# Patient Record
Sex: Female | Born: 1940 | Race: White | Hispanic: No | Marital: Single | Smoking: Former smoker
Health system: Southern US, Community
[De-identification: ages and names within clinical notes are randomized; demographics above are authoritative.]

## PROBLEM LIST (undated history)

## (undated) DIAGNOSIS — E7800 Pure hypercholesterolemia, unspecified: Secondary | ICD-10-CM

## (undated) DIAGNOSIS — R569 Unspecified convulsions: Secondary | ICD-10-CM

## (undated) DIAGNOSIS — I739 Peripheral vascular disease, unspecified: Secondary | ICD-10-CM

## (undated) DIAGNOSIS — I480 Paroxysmal atrial fibrillation: Secondary | ICD-10-CM

## (undated) DIAGNOSIS — I10 Essential (primary) hypertension: Secondary | ICD-10-CM

## (undated) HISTORY — DX: Unspecified convulsions: R56.9

## (undated) HISTORY — DX: Paroxysmal atrial fibrillation: I48.0

## (undated) HISTORY — DX: Peripheral vascular disease, unspecified: I73.9

---

## 2019-06-18 ENCOUNTER — Encounter (HOSPITAL_COMMUNITY): Payer: Self-pay | Admitting: Emergency Medicine

## 2019-06-18 ENCOUNTER — Inpatient Hospital Stay (HOSPITAL_COMMUNITY): Payer: Medicare Other

## 2019-06-18 ENCOUNTER — Inpatient Hospital Stay (HOSPITAL_COMMUNITY)
Admission: EM | Admit: 2019-06-18 | Discharge: 2019-06-25 | DRG: 101 | Disposition: A | Discharge status: Skilled Nursing Facility | Payer: Medicare Other | Attending: Student in an Organized Health Care Education/Training Program | Admitting: Student in an Organized Health Care Education/Training Program

## 2019-06-18 ENCOUNTER — Emergency Department (HOSPITAL_COMMUNITY): Payer: Medicare Other

## 2019-06-18 ENCOUNTER — Other Ambulatory Visit: Payer: Self-pay

## 2019-06-18 DIAGNOSIS — E871 Hypo-osmolality and hyponatremia: Secondary | ICD-10-CM | POA: Diagnosis present

## 2019-06-18 DIAGNOSIS — Z1389 Encounter for screening for other disorder: Secondary | ICD-10-CM

## 2019-06-18 DIAGNOSIS — E7800 Pure hypercholesterolemia, unspecified: Secondary | ICD-10-CM | POA: Diagnosis present

## 2019-06-18 DIAGNOSIS — I10 Essential (primary) hypertension: Secondary | ICD-10-CM | POA: Diagnosis not present

## 2019-06-18 DIAGNOSIS — F0390 Unspecified dementia without behavioral disturbance: Secondary | ICD-10-CM | POA: Diagnosis not present

## 2019-06-18 DIAGNOSIS — D72829 Elevated white blood cell count, unspecified: Secondary | ICD-10-CM | POA: Diagnosis present

## 2019-06-18 DIAGNOSIS — Z87891 Personal history of nicotine dependence: Secondary | ICD-10-CM | POA: Diagnosis not present

## 2019-06-18 DIAGNOSIS — E785 Hyperlipidemia, unspecified: Secondary | ICD-10-CM | POA: Diagnosis not present

## 2019-06-18 DIAGNOSIS — Z20828 Contact with and (suspected) exposure to other viral communicable diseases: Secondary | ICD-10-CM | POA: Diagnosis present

## 2019-06-18 DIAGNOSIS — Z7901 Long term (current) use of anticoagulants: Secondary | ICD-10-CM | POA: Diagnosis not present

## 2019-06-18 DIAGNOSIS — E861 Hypovolemia: Secondary | ICD-10-CM | POA: Diagnosis not present

## 2019-06-18 DIAGNOSIS — Z8673 Personal history of transient ischemic attack (TIA), and cerebral infarction without residual deficits: Secondary | ICD-10-CM | POA: Diagnosis not present

## 2019-06-18 DIAGNOSIS — I4891 Unspecified atrial fibrillation: Secondary | ICD-10-CM | POA: Diagnosis not present

## 2019-06-18 DIAGNOSIS — Z79899 Other long term (current) drug therapy: Secondary | ICD-10-CM | POA: Diagnosis not present

## 2019-06-18 DIAGNOSIS — R569 Unspecified convulsions: Secondary | ICD-10-CM | POA: Diagnosis not present

## 2019-06-18 DIAGNOSIS — R4182 Altered mental status, unspecified: Secondary | ICD-10-CM | POA: Diagnosis present

## 2019-06-18 DIAGNOSIS — N179 Acute kidney failure, unspecified: Secondary | ICD-10-CM | POA: Diagnosis not present

## 2019-06-18 DIAGNOSIS — D473 Essential (hemorrhagic) thrombocythemia: Secondary | ICD-10-CM | POA: Diagnosis not present

## 2019-06-18 DIAGNOSIS — G9341 Metabolic encephalopathy: Secondary | ICD-10-CM | POA: Diagnosis not present

## 2019-06-18 DIAGNOSIS — G4089 Other seizures: Principal | ICD-10-CM | POA: Diagnosis present

## 2019-06-18 DIAGNOSIS — I361 Nonrheumatic tricuspid (valve) insufficiency: Secondary | ICD-10-CM | POA: Diagnosis not present

## 2019-06-18 DIAGNOSIS — E860 Dehydration: Secondary | ICD-10-CM | POA: Diagnosis present

## 2019-06-18 DIAGNOSIS — I959 Hypotension, unspecified: Secondary | ICD-10-CM | POA: Diagnosis not present

## 2019-06-18 DIAGNOSIS — R079 Chest pain, unspecified: Secondary | ICD-10-CM | POA: Diagnosis not present

## 2019-06-18 DIAGNOSIS — N19 Unspecified kidney failure: Secondary | ICD-10-CM

## 2019-06-18 DIAGNOSIS — I351 Nonrheumatic aortic (valve) insufficiency: Secondary | ICD-10-CM | POA: Diagnosis not present

## 2019-06-18 DIAGNOSIS — E119 Type 2 diabetes mellitus without complications: Secondary | ICD-10-CM | POA: Diagnosis present

## 2019-06-18 HISTORY — DX: Essential (primary) hypertension: I10

## 2019-06-18 HISTORY — DX: Pure hypercholesterolemia, unspecified: E78.00

## 2019-06-18 LAB — GLUCOSE, CAPILLARY: Glucose-Capillary: 99 mg/dL (ref 70–99)

## 2019-06-18 LAB — CBC WITH DIFFERENTIAL/PLATELET
Abs Immature Granulocytes: 0.07 10*3/uL (ref 0.00–0.07)
Basophils Absolute: 0 10*3/uL (ref 0.0–0.1)
Basophils Relative: 0 %
Eosinophils Absolute: 0.3 10*3/uL (ref 0.0–0.5)
Eosinophils Relative: 3 %
HCT: 39.4 % (ref 36.0–46.0)
Hemoglobin: 13.6 g/dL (ref 12.0–15.0)
Immature Granulocytes: 1 %
Lymphocytes Relative: 6 %
Lymphs Abs: 0.6 10*3/uL — ABNORMAL LOW (ref 0.7–4.0)
MCH: 29.8 pg (ref 26.0–34.0)
MCHC: 34.5 g/dL (ref 30.0–36.0)
MCV: 86.4 fL (ref 80.0–100.0)
Monocytes Absolute: 0.8 10*3/uL (ref 0.1–1.0)
Monocytes Relative: 7 %
Neutro Abs: 9.5 10*3/uL — ABNORMAL HIGH (ref 1.7–7.7)
Neutrophils Relative %: 83 %
Platelets: 291 10*3/uL (ref 150–400)
RBC: 4.56 MIL/uL (ref 3.87–5.11)
RDW: 13.7 % (ref 11.5–15.5)
WBC: 11.4 10*3/uL — ABNORMAL HIGH (ref 4.0–10.5)
nRBC: 0 % (ref 0.0–0.2)

## 2019-06-18 LAB — BASIC METABOLIC PANEL
Anion gap: 14 (ref 5–15)
Anion gap: 11 (ref 5–15)
Anion gap: 10 (ref 5–15)
BUN: 27 mg/dL — ABNORMAL HIGH (ref 8–23)
BUN: 26 mg/dL — ABNORMAL HIGH (ref 8–23)
BUN: 26 mg/dL — ABNORMAL HIGH (ref 8–23)
CO2: 19 mmol/L — ABNORMAL LOW (ref 22–32)
CO2: 21 mmol/L — ABNORMAL LOW (ref 22–32)
CO2: 22 mmol/L (ref 22–32)
Calcium: 8.9 mg/dL (ref 8.9–10.3)
Calcium: 8.9 mg/dL (ref 8.9–10.3)
Calcium: 8.8 mg/dL — ABNORMAL LOW (ref 8.9–10.3)
Chloride: 93 mmol/L — ABNORMAL LOW (ref 98–111)
Chloride: 92 mmol/L — ABNORMAL LOW (ref 98–111)
Chloride: 94 mmol/L — ABNORMAL LOW (ref 98–111)
Creatinine, Ser: 1.68 mg/dL — ABNORMAL HIGH (ref 0.44–1.00)
Creatinine, Ser: 1.5 mg/dL — ABNORMAL HIGH (ref 0.44–1.00)
Creatinine, Ser: 1.35 mg/dL — ABNORMAL HIGH (ref 0.44–1.00)
GFR calc Af Amer: 34 mL/min — ABNORMAL LOW (ref >60)
GFR calc Af Amer: 39 mL/min — ABNORMAL LOW (ref >60)
GFR calc Af Amer: 44 mL/min — ABNORMAL LOW (ref >60)
GFR calc non Af Amer: 29 mL/min — ABNORMAL LOW (ref >60)
GFR calc non Af Amer: 33 mL/min — ABNORMAL LOW (ref >60)
GFR calc non Af Amer: 38 mL/min — ABNORMAL LOW (ref >60)
Glucose, Bld: 105 mg/dL — ABNORMAL HIGH (ref 70–99)
Glucose, Bld: 103 mg/dL — ABNORMAL HIGH (ref 70–99)
Glucose, Bld: 101 mg/dL — ABNORMAL HIGH (ref 70–99)
Potassium: 3.6 mmol/L (ref 3.5–5.1)
Potassium: 3.7 mmol/L (ref 3.5–5.1)
Potassium: 3.6 mmol/L (ref 3.5–5.1)
Sodium: 126 mmol/L — ABNORMAL LOW (ref 135–145)
Sodium: 124 mmol/L — ABNORMAL LOW (ref 135–145)
Sodium: 126 mmol/L — ABNORMAL LOW (ref 135–145)

## 2019-06-18 LAB — RAPID URINE DRUG SCREEN, HOSP PERFORMED
Amphetamines: NOT DETECTED
Barbiturates: NOT DETECTED
Benzodiazepines: POSITIVE — AB
Cocaine: NOT DETECTED
Opiates: NOT DETECTED
Tetrahydrocannabinol: NOT DETECTED

## 2019-06-18 LAB — URINALYSIS, ROUTINE W REFLEX MICROSCOPIC
Bilirubin Urine: NEGATIVE
Glucose, UA: NEGATIVE mg/dL
Hgb urine dipstick: NEGATIVE
Ketones, ur: NEGATIVE mg/dL
Nitrite: NEGATIVE
Protein, ur: NEGATIVE mg/dL
Specific Gravity, Urine: 1.014 (ref 1.005–1.030)
pH: 5 (ref 5.0–8.0)

## 2019-06-18 LAB — COMPREHENSIVE METABOLIC PANEL
ALT: 22 U/L (ref 0–44)
AST: 36 U/L (ref 15–41)
Albumin: 3.9 g/dL (ref 3.5–5.0)
Alkaline Phosphatase: 65 U/L (ref 38–126)
Anion gap: 15 (ref 5–15)
BUN: 36 mg/dL — ABNORMAL HIGH (ref 8–23)
CO2: 19 mmol/L — ABNORMAL LOW (ref 22–32)
Calcium: 9.3 mg/dL (ref 8.9–10.3)
Chloride: 91 mmol/L — ABNORMAL LOW (ref 98–111)
Creatinine, Ser: 2.5 mg/dL — ABNORMAL HIGH (ref 0.44–1.00)
GFR calc Af Amer: 21 mL/min — ABNORMAL LOW (ref >60)
GFR calc non Af Amer: 18 mL/min — ABNORMAL LOW (ref >60)
Glucose, Bld: 119 mg/dL — ABNORMAL HIGH (ref 70–99)
Potassium: 3.6 mmol/L (ref 3.5–5.1)
Sodium: 125 mmol/L — ABNORMAL LOW (ref 135–145)
Total Bilirubin: 0.2 mg/dL — ABNORMAL LOW (ref 0.3–1.2)
Total Protein: 6.5 g/dL (ref 6.5–8.1)

## 2019-06-18 LAB — SARS CORONAVIRUS 2 BY RT PCR (HOSPITAL ORDER, PERFORMED IN ~~LOC~~ HOSPITAL LAB): SARS Coronavirus 2: NEGATIVE

## 2019-06-18 LAB — CBG MONITORING, ED
Glucose-Capillary: 121 mg/dL — ABNORMAL HIGH (ref 70–99)
Glucose-Capillary: 107 mg/dL — ABNORMAL HIGH (ref 70–99)

## 2019-06-18 LAB — HEMOGLOBIN A1C
Hgb A1c MFr Bld: 6.3 % — ABNORMAL HIGH (ref 4.8–5.6)
Mean Plasma Glucose: 134.11 mg/dL

## 2019-06-18 LAB — OSMOLALITY: Osmolality: 266 mOsm/kg — ABNORMAL LOW (ref 275–295)

## 2019-06-18 LAB — CK: Total CK: 307 U/L — ABNORMAL HIGH (ref 38–234)

## 2019-06-18 LAB — OSMOLALITY, URINE: Osmolality, Ur: 480 mOsm/kg (ref 300–900)

## 2019-06-18 LAB — MAGNESIUM: Magnesium: 1.7 mg/dL (ref 1.7–2.4)

## 2019-06-18 MED ORDER — ATENOLOL 50 MG PO TABS: 100.0000 mg | ORAL_TABLET | Freq: Two times a day (BID) | ORAL | Status: DC

## 2019-06-18 MED ORDER — SODIUM CHLORIDE 0.9% FLUSH
3.0000 mL | Freq: Two times a day (BID) | INTRAVENOUS | Status: DC
  Administered 2019-06-18 – 2019-06-25 (×11): 3 mL via INTRAVENOUS

## 2019-06-18 MED ORDER — INSULIN ASPART 100 UNIT/ML ~~LOC~~ SOLN
0.0000 [IU] | Freq: Three times a day (TID) | SUBCUTANEOUS | Status: DC
  Administered 2019-06-21: 1 [IU] via SUBCUTANEOUS

## 2019-06-18 MED ORDER — HEPARIN SODIUM (PORCINE) 5000 UNIT/ML IJ SOLN
5000.0000 [IU] | Freq: Three times a day (TID) | INTRAMUSCULAR | Status: DC
  Administered 2019-06-18 – 2019-06-20 (×5): 5000 [IU] via SUBCUTANEOUS
  Filled 2019-06-18 (×6): qty 1

## 2019-06-18 MED ORDER — SODIUM CHLORIDE 0.9 % IV SOLN
INTRAVENOUS | Status: DC
  Administered 2019-06-18 – 2019-06-19 (×4): via INTRAVENOUS

## 2019-06-18 MED ORDER — PRAVASTATIN SODIUM 40 MG PO TABS
40.0000 mg | ORAL_TABLET | Freq: Every day | ORAL | Status: DC
  Administered 2019-06-19 – 2019-06-24 (×6): 40 mg via ORAL
  Filled 2019-06-18 (×5): qty 1

## 2019-06-18 MED ORDER — ATENOLOL 50 MG PO TABS
100.0000 mg | ORAL_TABLET | Freq: Two times a day (BID) | ORAL | Status: DC
  Administered 2019-06-18: 23:00:00 100 mg via ORAL
  Filled 2019-06-18: qty 2

## 2019-06-18 MED ORDER — ACETAMINOPHEN 325 MG PO TABS
650.0000 mg | ORAL_TABLET | Freq: Four times a day (QID) | ORAL | Status: DC | PRN
  Administered 2019-06-22: 21:00:00 650 mg via ORAL
  Filled 2019-06-18: qty 2

## 2019-06-18 MED ORDER — SODIUM CHLORIDE 0.9 % IV BOLUS
1000.0000 mL | Freq: Once | INTRAVENOUS | Status: AC
  Administered 2019-06-18: 1000 mL via INTRAVENOUS

## 2019-06-18 MED ORDER — DIAZEPAM 5 MG PO TABS
5.0000 mg | ORAL_TABLET | Freq: Two times a day (BID) | ORAL | Status: DC | PRN
  Administered 2019-06-20 – 2019-06-24 (×6): 5 mg via ORAL
  Filled 2019-06-18 (×7): qty 1

## 2019-06-18 MED ORDER — LEVETIRACETAM IN NACL 500 MG/100ML IV SOLN
500.0000 mg | Freq: Two times a day (BID) | INTRAVENOUS | Status: DC
  Administered 2019-06-18 – 2019-06-20 (×4): 500 mg via INTRAVENOUS
  Filled 2019-06-18 (×5): qty 100

## 2019-06-18 MED ORDER — ACETAMINOPHEN 650 MG RE SUPP: 650.0000 mg | Freq: Four times a day (QID) | RECTAL | Status: DC | PRN

## 2019-06-18 MED ORDER — ONDANSETRON HCL 4 MG PO TABS: 4.0000 mg | ORAL_TABLET | Freq: Four times a day (QID) | ORAL | Status: DC | PRN

## 2019-06-18 MED ORDER — ONDANSETRON HCL 4 MG/2ML IJ SOLN: 4.0000 mg | Freq: Four times a day (QID) | INTRAMUSCULAR | Status: DC | PRN

## 2019-06-18 NOTE — ED Provider Notes (Signed)
[facility] EMERGENCY DEPARTMENT Provider Note   CSN: [id number] Arrival date & time: 06/18/19  [id number]     History   Chief Complaint Chief Complaint  Patient presents with  . Altered Mental Status    HPI Jakira Locklin is a 78 y.o. female.     78 year old female with a history of hypertension, dyslipidemia presents to the emergency department for altered mental status.  Per EMS, family reports that patient has had increased slurred speech and confusion x1 week.  Friend of the family is a speech therapist and expressed concern that the patient may have had a stroke.  Family has been monitoring the patient since this change.  Also per EMS, patient has gone from independent to dependent with in a months timeframe.  She is from the [place], [place], but moved in with family in [place] and that family moved her to her daughter's home.  She was most recently sent to live with her son in [place] who is unsure of the patient's baseline as he has not seen her in 2 years.  Patient denies any acute pain at this time.  Does concur that she has been more forgetful recently.  The history is provided by the patient and the EMS personnel. No language interpreter was used.  Altered Mental Status   Past Medical History:  Diagnosis Date  . Hypercholesteremia   . Hypertension     There are no active problems to display for this patient.   History reviewed. No pertinent surgical history.   OB History   No obstetric history on file.      Home Medications    Prior to Admission medications   Not on File    Family History History reviewed. No pertinent family history.  Social History Social History   Tobacco Use  . Smoking status: Former Research scientist (life sciences)  . Smokeless tobacco: Never Used  Substance Use Topics  . Alcohol use: Not Currently  . Drug use: Not Currently     Allergies   Patient has no known allergies.   Review of Systems Review of Systems Ten  systems reviewed and are negative for acute change, except as noted in the HPI.    Physical Exam Updated Vital Signs BP 101/75   Pulse 60   Temp 98.9 F (37.2 C) (Oral)   Resp 19   Ht 5\' 6"  (1.676 m)   Wt 63.5 kg   SpO2 96%   BMI 22.60 kg/m   Physical Exam Vitals signs and nursing note reviewed.  Constitutional:      General: She is not in acute distress.    Appearance: She is well-developed. She is not diaphoretic.     Comments: Patient in no acute distress.  Pleasant.  HENT:     Head: Normocephalic.     Comments: Contusion to L frontal/parietal scalp. No hematoma.    Right Ear: Tympanic membrane, ear canal and external ear normal.     Left Ear: Tympanic membrane, ear canal and external ear normal.     Ears:     Comments: No hemotympanum b/l    Mouth/Throat:     Mouth: Mucous membranes are moist.     Comments: Normal tongue protrusion.  Symmetric rise of uvula with phonation. Eyes:     General: No scleral icterus.    Extraocular Movements: Extraocular movements intact.     Conjunctiva/sclera: Conjunctivae normal.     Pupils: Pupils are equal, round, and reactive to light.  Comments: No nystagmus  Neck:     Musculoskeletal: Normal range of motion.  Cardiovascular:     Rate and Rhythm: Normal rate and regular rhythm.     Pulses: Normal pulses.  Pulmonary:     Effort: Pulmonary effort is normal. No respiratory distress.     Comments: Respirations even and unlabored Musculoskeletal: Normal range of motion.  Skin:    General: Skin is warm and dry.     Coloration: Skin is not pale.     Findings: No erythema or rash.  Neurological:     Mental Status: She is alert.     Comments: GCS 15. Alert to self, but unable to state year, city, president. Inability to recall 3 words given during exam. Speech mildly slurred, but may be limited due to absent denition. No cranial nerve deficits appreciated; symmetric eyebrow raise, no facial drooping, tongue midline. Patient has  equal grip strength bilaterally with 5/5 strength against resistance in all major muscle groups bilaterally. Sensation to light touch intact. Patient moves extremities without ataxia.  Psychiatric:        Behavior: Behavior normal.      ED Treatments / Results  Labs (all labs ordered are listed, but only abnormal results are displayed) Labs Reviewed  CBG MONITORING, ED - Abnormal; Notable for the following components:      Result Value   Glucose-Capillary 121 (*)    All other components within normal limits  URINE CULTURE  COMPREHENSIVE METABOLIC PANEL  CBC WITH DIFFERENTIAL/PLATELET  URINALYSIS, ROUTINE W REFLEX MICROSCOPIC  RAPID URINE DRUG SCREEN, HOSP PERFORMED    EKG None  Radiology No results found.  Procedures Procedures (including critical care time)  Medications Ordered in ED Medications - No data to display    Initial Impression / Assessment and Plan / ED Course  I have reviewed the triage vital signs and the nursing notes.  Pertinent labs & imaging results that were available during my care of the patient were reviewed by me and considered in my medical decision making (see chart for details).        67:19 AM 78 year old female presents to the emergency department for evaluation of altered mental status.  History suggests potential concern for underlying stroke due to slurred speech.  Family also reported increased confusion x1 week to EMS.  Patient has no definitive focal findings on her neurologic exam, but has difficulty recalling recent memory.  Is alert to self, but unable to specify city, year, president.  Will obtain labs as well as head CT.  May require MRI for stroke rule out.  6:27 AM CBG 121, per RN  6:34 AM Patient more groggy in the AM yesterday, but perked up by the afternoon. Son states that patient was "russling" around 3AM. Assisted to restroom around [place] to check on patient later and found patient sitting in her walker; unable to  get her to wake up. Arms seemed stiff and her whole body was shaking. Head was tilted back with erratic breathing. Unresponsive for ~15-20 minutes. Started to become responsive when on the phone with EMS.  Per son, patient recently placed on abx for UTI. He is unsure if she has been taking this consistently. Son says patient had an appointment at [facility] for placement in an assisted living facility in [place], [place]. Currently the patient has no POA.  When asked about the son's comfort excepting the patient back into his home should she be stable for discharge, he states that he has  been overwhelmed in caring for her and is unsure if that is the best option for the patient as well as for himself.  For this reason, would benefit from care management and social work consultation as well as assessment by PT/OT.  Patient signed out to Albrizze, PA-C at shift change.   Final Clinical Impressions(s) / ED Diagnoses   Final diagnoses:  Altered mental status, unspecified altered mental status type    ED Discharge Orders    None       Antonietta Breach, PA-C 06/18/19 [place], [place], MD 06/18/19 [phone]

## 2019-06-18 NOTE — Procedures (Signed)
Patient Name: Kendra Charles  MRN: [id number]  Epilepsy Attending: Lora Havens  Referring Physician/Provider: Dr Welford Roche Date: 06/18/2019 Duration: 26.14 mins  Patient history: 78yo F with right parietal infarct who presented with seizure like episode. EEG to evaluate for seizure.  Level of alertness: awake  AEDs during EEG study: LEV  Technical aspects: This EEG study was done with scalp electrodes positioned according to the 10-20 International system of electrode placement. Electrical activity was acquired at a sampling rate of 500Hz  and reviewed with a high frequency filter of 70Hz  and a low frequency filter of 1Hz . EEG data were recorded continuously and digitally stored.   DESCRIPTION: EEG showed continuous low voltage slowing in right frontotemporal region. There was also continuous generalized 2-6hz  polymorphic theta-delta slowing.  There was also an excessive amount of 15 to 18 Hz, 2-3 uV beta activity with irregular morphology distributed symmetrically and diffusely. No clear posterior dominant rhythm was seen. Hyperventilation and photic stimulation were not performed.  IMPRESSION: This study is suggestive of cortical dysfunction in right frontotemporal region, likely secondary to underlying chronic infarct. There was also moderate diffuse encephalopathy. No seizures or definite epileptiform discharges were seen throughout the recording.  The excessive beta activity seen in the background could be due to the effect of benzodiazepine and is a benign EEG pattern.  Kendra Charles Kendra Charles

## 2019-06-18 NOTE — Progress Notes (Signed)
Patient received to the unit via bed. Patient is alert and oriented self. Iv in place and running fluid, patient is on telemetry. Skin assessment done with another nurse. No any distress noted. Patient given instructions about call bell and phone. Bed in low position and call bell in reach.

## 2019-06-18 NOTE — H&P (Signed)
Date: 06/18/2019               Patient Name:  Kendra Charles MRN: [id number]  DOB: Jul 22, 1941 Age / Sex: 78 y.o., female   PCP: Patient, No Pcp Per         Medical Service: Internal Medicine Teaching Service         Attending Physician: Dr. Evette Doffing, Mallie Mussel, *    First Contact: Dr. Sheppard Coil Pager: [phone]  Second Contact: Dr. Berline Lopes  Pager: [phone]       After Hours (After 5p/  First Contact Pager: [phone]  weekends / holidays): Second Contact Pager: [phone]   Chief Complaint: Altered mental status  History of Present Illness:  Kendra Charles is a 78 year-old female with a history of CVA, dementia, HTN, HLD, and ?PAD who presented from home after being found unresponsive by son.  She does not remember the events of this morning.  I called son Kendra Charles for collateral.  Patient moved in with him 2 days ago after his sister was no longer able to take care of her.  Prior to Tuesday the last time he saw patient was 2 years ago.  At baseline she is talkative but needs assistance completing all of her ADLs.  She has been doing overall well until this morning when he assisted her to the bathroom at 4:30 AM.  After she did not call him to be helped out he went to check on her.  He found her seating in the toilet with her head extended back and bilaterals arm shaking.  He is not sure how long this lasted.  He was unable to arouse her after this event and called EMS. She does not have a history of seizures to his knowledge.  Her only new medication is Bactrim for UTI that she received recently in a hospital. She does not drink alcohol or use illicit drugs. She has not seen a doctor in at least 4 years but is taking medications. Son is not sure who is prescribing these medications. She complains of diffuse soreness. She denies HA, changes in vision, dysphagia, chest pain, SOB, cough, abdominal pain, N/V, urinary symptoms, and changes in bowel movements.   ED course: Patient was  afebrile and hemodynamically stable on arrival. She was alert and talkative. Blood work showed a mild leukocytosis 11.2, NA 125, K 3.6, BUN 36, Cr 2.5, serum osm 266, and COVID negative. CXR without acute abnormalities. CT head showed old R parietal and L basal ganglia infarcts. She received 1L NS bolus.   Meds:  Current Meds  Medication Sig   atenolol (TENORMIN) 100 MG tablet Take 100 mg by mouth 2 (two) times daily.    diazepam (VALIUM) 5 MG tablet Take 5 mg by mouth as needed.   hydrochlorothiazide (HYDRODIURIL) 25 MG tablet Take 25 mg by mouth daily.   lisinopril (ZESTRIL) 30 MG tablet Take 30 mg by mouth daily.   lovastatin (MEVACOR) 40 MG tablet Take 40 mg by mouth daily.   sulfamethoxazole-trimethoprim (BACTRIM DS) 800-160 MG tablet Take 1 tablet by mouth 2 (two) times daily.   traMADol (ULTRAM) 50 MG tablet Take 50 mg by mouth as needed.   [DISCONTINUED] ALPRAZolam (XANAX) 0.25 MG tablet Take 0.25 mg by mouth daily.    Allergies: Allergies as of 06/18/2019   (No Known Allergies)   Past Medical History:  Diagnosis Date   Hypercholesteremia    Hypertension     Family History: HTN -father and  mother  Social History: Patient is originally from [place].  Two months ago she moved from [place] where she was living with her sister to [place] [address] over her daughter.  Her daughter is no longer able to provide care for her and send her to [place] to live with her son [name].  He has not seen her in 2 years and is not clear how her mentation and function are at baseline.  He does know she has been deteriorating for the past 2 years.  She is talkative (has a slurred speech at baseline), but is no longer able to complete ADLs other than dressing herself.  [name] and his wife were in the process of moving her to an assisted living facility in [place], [place]. She does not drink alcohol or use illicit drugs to his knowledge.  Stopped smoking 50 years ago.  Review of Systems: A  complete ROS was negative except as per HPI.   Physical Exam: Blood pressure 121/79, pulse (!) 56, temperature 98.9 F (37.2 C), temperature source Oral, resp. rate 19, height 5\' 6"  (1.676 m), weight 63.5 kg, SpO2 98 %.  General: chronically ill appearing female, sleeping, slumping to the L, in no acute distress  HENT: NCAT, neck supple and FROM, mucus membranes dry,poor dentition  Eyes: anicteric sclera, PERRL Cardiac: regular rate and rhythm, nl S1/S2, no murmurs, rubs or gallops  Pulm: CTAB, no wheezes or crackles, no increased work of breathing on room air  Abd: soft, NTND, bowel sounds are normoactive  Neuro: A&Ox3, CN II-XII intact, sensation intact in all four extremities, 5/5 strength in all 4 extremities, no dysmetria, no dysdiadochokinesia. Speech is slurred. Gait not tested.   Ext: warm and well perfused, no peripheral edema Derm: no rashes or lesions  Psych: attentive, appropriate affect, answers questions appropriately     CT head: IMPRESSION:  1. 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  2. Advanced atrophy and white matter disease.  3. Remote lacunar infarcts of the left basal ganglia.  4. Age indeterminate right basal ganglia infarct.  5. No acute hemorrhage.  6. Atherosclerosis.    Assessment & Plan by Problem: Principal Problem:   Hyponatremia Active Problems:   AKI (acute kidney injury) ([place])   Dementia ([place])   Essential hypertension   Hyperlipidemia  Apoorva Maners is a 78 year old female with a history of dementia, HTN,, and HLD who presents from home with acute change in mentation this morning concerning for CVA or seizure.  # Acute metabolic encephalopathy: Patient found unresponsive this AM with jerking movements of UE concerning for seizure activity which she is at risk given his history of CVAs. She is also hyponatremic with Na 125, though seizures usually seen with levels < 115. CT head showed old R parietal and L basal ganglia  infarcts and advanced atrophy. Neurology was consulted in the ED who recommended MRI brain and EEG.  - Appreciate neurology recommendations - Follow up MRI brain and EEG - Delirium and seizure precautions  - Correcting metabolic abnormalities as below   # Hypovolemic, hypotonic hyponatremia: I suspect this is secondary to poor oral intake she appears hypovolemic on exam.  She received 1L bolus of NS in the ED.  - NS @ 75 cc/hr with goal Na 131 by 6AM tomorrow  - Follow up BMP every 4 hours   # AKI:  Her creatinine on admission was 2.5 (no prior ones available for comparison). It is unknown if patient has underlying CKD.  She does take HCTZ and lisinopril for HTN and was started on Bactrim recently for a possible UTI. - Stop Bactrim  - Hold home antihypertensive medications - Renal US  - Will check CK due to possible seizure   # Deconditioning: Per son, patient's health has been deteriorating for the past 2 months. She has not been seen by a physician in years and is no longer able to complete ADLs. He and his wife were recently able to get her health insurance and found a  ALF where she could stay permanently, [facility]. She was supposed to move today. Will consult SW to assist with transition once patient is ready for discharge.   # HTN: Currently normotensive.  Holding medications as above. # HLD: Continue lovastatin 40 mg daily.   Diet: [place] IVF: NS @ 75cc/hr  VTE ppx: SQ heparin q8h  Code status: FULL CODE, confirmed on admission   Son: Kendra Charles [phone] and [phone]  Daughter: Lennie Muckle [phone]   Dispo: Admit patient to Inpatient with expected length of stay greater than 2 midnights.  SignedWelford Roche, MD 06/18/2019, 11:08 AM  Pager: [phone]

## 2019-06-18 NOTE — ED Provider Notes (Signed)
Patient received in sign out.    In short, 78 yo female presents after possible seizure vs AMS. Prior to arrival, pt was found to be sitting on her walker, with both arms contracted and decreased level of responsiveness. Son reports not being able to wake her up. EMS arrived 20 minutes later and pt appeared to be back to baseline. Pt did not bite her tongue, no urinary or fecal incontinence. Pt is currently living with her son who has not seen her in 2 years and is unsure of complete medical history. He reports she had a UTI recently and was given antibiotics.  Work up by previous provider is significant for hyponatremia of 125, BUN/creatinine of 36/2.50. CT head is concerning for old infarcts.   Physical Exam  BP 110/75   Pulse (!) 59   Temp 98.9 F (37.2 C) (Oral)   Resp (!) 22   Ht 5\' 6"  (1.676 m)   Wt 63.5 kg   SpO2 97%   BMI 22.60 kg/m   Physical Exam PE: Constitutional: well-developed, well-nourished, no apparent distress HENT: normocephalic.  Contusion to left frontal/parietal scalp, no hematoma seen.  No battle signs or raccoon eyes.  No hemotympanum. No lacerations to tongue.  Cardiovascular: normal rate and rhythm, distal pulses intact Pulmonary/Chest: effort normal; breath sounds clear and equal bilaterally; no wheezes or rales Abdominal: soft and nontender Musculoskeletal: full ROM, no edema Neurological: Patient alert to self only.  GCS is 15.  No facial droop, cranial nerves II through XII are intact.  Equal grip strength with 5 out of 5 strength in bilateral upper and lower extremities.  Sensation intact.  Patient has slurred speech, also has multiple missing teeth likely contributing. Son reports speech is unchanged. Skin: warm and dry, no rash, no diaphoresis Psychiatric: normal mood and affect, normal behavior   ED Course/Procedures   Results for orders placed or performed during the hospital encounter of 06/18/19 (from the past 24 hour(s))  Comprehensive metabolic  panel     Status: Abnormal   Collection Time: 06/18/19  6:15 AM  Result Value Ref Range   Sodium 125 (L) 135 - 145 mmol/L   Potassium 3.6 3.5 - 5.1 mmol/L   Chloride 91 (L) 98 - 111 mmol/L   CO2 19 (L) 22 - 32 mmol/L   Glucose, Bld 119 (H) 70 - 99 mg/dL   BUN 36 (H) 8 - 23 mg/dL   Creatinine, Ser 2.50 (H) 0.44 - 1.00 mg/dL   Calcium 9.3 8.9 - 10.3 mg/dL   Total Protein 6.5 6.5 - 8.1 g/dL   Albumin 3.9 3.5 - 5.0 g/dL   AST 36 15 - 41 U/L   ALT 22 0 - 44 U/L   Alkaline Phosphatase 65 38 - 126 U/L   Total Bilirubin 0.2 (L) 0.3 - 1.2 mg/dL   GFR calc non Af Amer 18 (L) >60 mL/min   GFR calc Af Amer 21 (L) >60 mL/min   Anion gap 15 5 - 15  CBC WITH DIFFERENTIAL     Status: Abnormal   Collection Time: 06/18/19  6:15 AM  Result Value Ref Range   WBC 11.4 (H) 4.0 - 10.5 K/uL   RBC 4.56 3.87 - 5.11 MIL/uL   Hemoglobin 13.6 12.0 - 15.0 g/dL   HCT 39.4 36.0 - 46.0 %   MCV 86.4 80.0 - 100.0 fL   MCH 29.8 26.0 - 34.0 pg   MCHC 34.5 30.0 - 36.0 g/dL   RDW 13.7 11.5 - 15.5 %  Platelets 291 150 - 400 K/uL   nRBC 0.0 0.0 - 0.2 %   Neutrophils Relative % 83 %   Neutro Abs 9.5 (H) 1.7 - 7.7 K/uL   Lymphocytes Relative 6 %   Lymphs Abs 0.6 (L) 0.7 - 4.0 K/uL   Monocytes Relative 7 %   Monocytes Absolute 0.8 0.1 - 1.0 K/uL   Eosinophils Relative 3 %   Eosinophils Absolute 0.3 0.0 - 0.5 K/uL   Basophils Relative 0 %   Basophils Absolute 0.0 0.0 - 0.1 K/uL   Immature Granulocytes 1 %   Abs Immature Granulocytes 0.07 0.00 - 0.07 K/uL  CBG monitoring, ED     Status: Abnormal   Collection Time: 06/18/19  6:23 AM  Result Value Ref Range   Glucose-Capillary 121 (H) 70 - 99 mg/dL   CT HEAD WITHOUT CONTRAST    TECHNIQUE:  Contiguous axial images were obtained from the base of the skull  through the vertex without intravenous contrast.    COMPARISON: None.    FINDINGS:  Brain: Advanced atrophy and diffuse white matter disease is present.  There is a remote infarct with chronic  volume loss in the right  parietal lobe. Lacunar infarcts involving the thalami and posterior  limb internal capsule bilaterally appear remote. Remote left basal  ganglia infarct is present. A more age indeterminate lacunar infarct  is present in the right caudate head and internal capsule.    Confluent white matter changes are present bilaterally. The  ventricles are proportionate to the degree of atrophy. No  significant extraaxial fluid collection is present. The brainstem  and cerebellum are unremarkable.    Vascular: Atherosclerotic calcifications are present within the  cavernous internal carotid arteries bilaterally and at the dural  margin of the left vertebral artery. There is no hyperdense vessel.    Skull: Normal. Negative for fracture or focal lesion.    Sinuses/Orbits: The paranasal sinuses and mastoid air cells are  clear. The globes and orbits are within normal limits.    IMPRESSION:  1. 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  2. Advanced atrophy and white matter disease.  3. Remote lacunar infarcts of the left basal ganglia.  4. Age indeterminate right basal ganglia infarct.  5. No acute hemorrhage.  6. Atherosclerosis.      Electronically Signed  By: San Morelle M.D.  On: 06/18/2019 07:02   .Critical Care Performed by: Cherre Robins, PA-C Authorized by: Cherre Robins, PA-C   Critical care provider statement:    Critical care time (minutes):  40   Critical care time was exclusive of:  Separately billable procedures and treating other patients and teaching time   Critical care was time spent personally by me on the following activities:  Discussions with consultants, pulse oximetry, ordering and review of radiographic studies, ordering and review of laboratory studies, examination of patient and development of treatment plan with patient or surrogate   I assumed direction of critical care for this patient  from another provider in my specialty: yes       MDM    Pt seen and examined. Pt is alert and oriented to self only. She responds appropriately to questions. Her speech is slurred, this is likely to missing multiple teeth. Labs are concerning for hyponatremia of 125, BUN/Creatine of 2.50, no prior to compare. Without previous labs to compare, unsure this is new or chronic renal failure. Pt's son is unsure of medical history besides hypertension, hyperlipidemia, and recent  UTI. Consulted neuro for possible seizure vs cva and case discussed with Dr. Leonel Ramsay recommends routine EEG with MRI. Plan for unassigned medical admission. Pt's son updated with plan. This case was discussed with Dr. Darl Householder who agrees with plan to admit. Spoke with Dr. Frederico Hamman with IM service who agrees to assume care of patient and bring into the hospital for further evaluation and management. She will follow results of MRI and UA   This note was prepared using Dragon voice recognition software and may include unintentional dictation errors due to the inherent limitations of voice recognition software.        Cherre Robins, PA-C 06/18/19 1005    Drenda Freeze, MD 06/18/19 1409

## 2019-06-18 NOTE — Consult Note (Addendum)
Neurology Consultation  Reason for Consult: Possible seizure Referring Physician: Evette Doffing  History is obtained from: Chart  HPI: Kendra Charles is a 78 y.o. female with past medical history of hypertension and hypercholesterolemia.  In talking to the son she recently was living with her sister however, just recently about 2 weeks ago, the sister called the son stating that she could not take care of her anymore.  Per son the patient has become more forgetful and needing more help with her ADLs.  Thus, the son is only had his mother for approximately 2 weeks.  He states that she does need help with washing herself, dressing herself, does not drive, has become forgetful and confused.  The reason that she was brought to the hospital is secondary to possible seizure.  He states that she went to bed early last night, but was not sleeping very well.  He got her up and brought her to the bathroom at approximately 4 AM.  After about 15 to 20 minutes he checked on her and noted that she was sitting on her walker, head was tilted back and he noted that she was stiff.  They got her to the bed and called 911.  By the time EMS arrived patient was confused and drowsy but coming around.  He describes her breathing as shallow and rhonchorous.  At this time she is confused and agitated.  She believes that her son is in the hospital.  And is easily distracted.  ROS:  Unable to obtain due to altered mental status.   Past Medical History:  Diagnosis Date  . Hypercholesteremia   . Hypertension      Family History  Problem Relation Age of Onset  . Hypertension Mother   . Hypertension Father      Social History:   reports that she has quit smoking. She has never used smokeless tobacco. She reports previous alcohol use. She reports previous drug use.  Medications  Current Facility-Administered Medications:  .  0.9 %  sodium chloride infusion, , Intravenous, Continuous, Santos-Sanchez, Idalys, MD, Last Rate:  75 mL/hr at 06/18/19 1402 .  acetaminophen (TYLENOL) tablet 650 mg, 650 mg, Oral, Q6H PRN **OR** acetaminophen (TYLENOL) suppository 650 mg, 650 mg, Rectal, Q6H PRN, Santos-Sanchez, Idalys, MD .  atenolol (TENORMIN) tablet 100 mg, 100 mg, Oral, BID, Santos-Sanchez, Idalys, MD .  diazepam (VALIUM) tablet 5 mg, 5 mg, Oral, Q12H PRN, Santos-Sanchez, Idalys, MD .  heparin injection 5,000 Units, 5,000 Units, Subcutaneous, Q8H, Santos-Sanchez, Idalys, MD, 5,000 Units at 06/18/19 1401 .  insulin aspart (novoLOG) injection 0-9 Units, 0-9 Units, Subcutaneous, TID WC, Santos-Sanchez, Idalys, MD .  ondansetron (ZOFRAN) tablet 4 mg, 4 mg, Oral, Q6H PRN **OR** ondansetron (ZOFRAN) injection 4 mg, 4 mg, Intravenous, Q6H PRN, Santos-Sanchez, Idalys, MD .  pravastatin (PRAVACHOL) tablet 40 mg, 40 mg, Oral, q1800, Santos-Sanchez, Idalys, MD .  sodium chloride flush (NS) 0.9 % injection 3 mL, 3 mL, Intravenous, Q12H, Santos-Sanchez, Idalys, MD, 3 mL at 06/18/19 1403  Current Outpatient Medications:  .  atenolol (TENORMIN) 100 MG tablet, Take 100 mg by mouth 2 (two) times daily. , Disp: , Rfl:  .  diazepam (VALIUM) 5 MG tablet, Take 5 mg by mouth as needed., Disp: , Rfl:  .  hydrochlorothiazide (HYDRODIURIL) 25 MG tablet, Take 25 mg by mouth daily., Disp: , Rfl:  .  lisinopril (ZESTRIL) 30 MG tablet, Take 30 mg by mouth daily., Disp: , Rfl:  .  lovastatin (MEVACOR) 40 MG tablet, Take  40 mg by mouth daily., Disp: , Rfl:  .  sulfamethoxazole-trimethoprim (BACTRIM DS) 800-160 MG tablet, Take 1 tablet by mouth 2 (two) times daily., Disp: , Rfl:  .  traMADol (ULTRAM) 50 MG tablet, Take 50 mg by mouth as needed., Disp: , Rfl:    Exam: Current vital signs: BP (!) 162/78   Pulse 84   Temp 98.9 F (37.2 C) (Oral)   Resp 20   Ht 5\' 6"  (1.676 m)   Wt 63.5 kg   SpO2 96%   BMI 22.60 kg/m  Vital signs in last 24 hours: Temp:  [98.9 F (37.2 C)] 98.9 F (37.2 C) (09/03 0609) Pulse Rate:  [47-84] 84 (09/03  1400) Resp:  [16-22] 20 (09/03 1400) BP: (101-162)/(68-86) 162/78 (09/03 1400) SpO2:  [94 %-99 %] 96 % (09/03 1400) Weight:  [63.5 kg] 63.5 kg (09/03 0610)  Physical Exam  Constitutional: Appears well-developed and well-nourished.  Psych: Agitated and easily distracted Eyes: No scleral injection HENT: No OP obstrucion Head: Normocephalic.  Cardiovascular: Normal rate and regular rhythm.  Respiratory: Effort normal, non-labored breathing GI: Soft.  No distension. There is no tenderness.  Skin: WDI  Neuro:-Much of the exam was difficult to obtain as patient was very hypervigilant and easily distracted Mental Status: Patient is awake, alert, not oriented to hospital, month, year. Patient is unable to give a coherent history  no signs of aphasia or neglect Cranial Nerves: II: Left hemianopia III,IV, VI: EOMI without ptosis or diploplia. Pupils equal, round and reactive to light V: Facial sensation is symmetric to temperature VII: Facial movement is symmetric.  VIII: hearing is intact to voice X: Palat elevates symmetrically XI: Shoulder shrug is symmetric. XII: tongue is midline without atrophy or fasciculations.  Motor: Tone is normal. Bulk is normal. 5/5 strength was present in all four extremities.  Sensory: Sensation is symmetric to light touch and temperature in the arms and legs. Deep Tendon Reflexes: 2+ and symmetric in the biceps and patellae.  Plantars: Toes are downgoing bilaterally.  Cerebellar: FNF intact     Labs I have reviewed labs in epic and the results pertinent to this consultation are:   CBC    Component Value Date/Time   WBC 11.4 (H) 06/18/2019 0615   RBC 4.56 06/18/2019 0615   HGB 13.6 06/18/2019 0615   HCT 39.4 06/18/2019 0615   PLT 291 06/18/2019 0615   MCV 86.4 06/18/2019 0615   MCH 29.8 06/18/2019 0615   MCHC 34.5 06/18/2019 0615   RDW 13.7 06/18/2019 0615   LYMPHSABS 0.6 (L) 06/18/2019 0615   MONOABS 0.8 06/18/2019 0615   EOSABS  0.3 06/18/2019 0615   BASOSABS 0.0 06/18/2019 0615    CMP     Component Value Date/Time   NA 126 (L) 06/18/2019 1354   K 3.6 06/18/2019 1354   CL 93 (L) 06/18/2019 1354   CO2 19 (L) 06/18/2019 1354   GLUCOSE 105 (H) 06/18/2019 1354   BUN 27 (H) 06/18/2019 1354   CREATININE 1.68 (H) 06/18/2019 1354   CALCIUM 8.9 06/18/2019 1354   PROT 6.5 06/18/2019 0615   ALBUMIN 3.9 06/18/2019 0615   AST 36 06/18/2019 0615   ALT 22 06/18/2019 0615   ALKPHOS 65 06/18/2019 0615   BILITOT 0.2 (L) 06/18/2019 0615   GFRNONAA 29 (L) 06/18/2019 1354   GFRAA 34 (L) 06/18/2019 1354    Lipid Panel  No results found for: CHOL, TRIG, HDL, CHOLHDL, VLDL, LDLCALC, LDLDIRECT   Imaging I have reviewed the images  obtained:  CT-scan of the brain- 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  Age-indeterminate right basal ganglia infarct.  MRI examination of the brain- significant motion degraded however does show no evidence of acute intracranial abnormality with chronic right parietal lobe cortical infarcts  Etta Quill PA-C [facility] [phone]  M-F  (9:00 am- 5:00 PM)  06/18/2019, 3:05 PM   I have seen the patient and reviewed the above note.  Assessment:  This is a 78 year old female who, although not diagnosed with dementia, per son's description likely has neurodegenerative decline.  Patient had an episode in which she was stiff and unable to arouse for at least 6 minutes with what sounds like posturing and shaking from description.  From the description it does sound like she had a seizure.  In addition although her sodium is not significantly low, given her age-with a sodium 126 this also could be contributing to her increased confusion and lowering the her seizure threshhold.  With her history of dementia and history of right parietal infarct, I do think she would be at risk  of recurrent seizures and therefore I would recommend starting Keppra.  Recommendations: -Awaiting EEG - Keppra 500 mg twice daily.    Roland Rack, MD [facility] [phone]  If 7pm- 7am, please page neurology on call as listed in [place].

## 2019-06-18 NOTE — ED Notes (Signed)
RN attempted to call report; RN to call back; 

## 2019-06-18 NOTE — Progress Notes (Signed)
Consult request has been received. CSW attempting to follow up at present time  Hayden Kihara M. Aviyanna Colbaugh LCSWA Transitions of Care  Clinical Social Worker  Ph: [phone] 

## 2019-06-18 NOTE — TOC Initial Note (Signed)
Transition of Care [facility]) - Initial/Assessment Note    Patient Details  Name: Kendra Charles MRN: [id number] Date of Birth: 18-Nov-1940  Transition of Care [facility]) CM/SW Contact:    [place], LCSW Phone Number: 06/18/2019, 11:05 PM  Clinical Narrative:   CSW at bedside to address consult concerning placement. Pts son,Kendra Charles, is present during the time of assessment. CSW engaged Kendra Charles in conversation concerning life at home since he's received her into his care. Kendra Charles reports that pt has resided with two other family members before him. When asked about pts orientation, Kendra Charles reports that pt is usually alert and oriented but does experience some confusion during the morning.   Kendra Charles reports that as of 2 weeks ago, pt was able to complete ADLS including dressing, bathing and toileting independently with supervision. Pt is at times incontinent of bladder. Kendra Charles explained the situation that led up to her being admitted into the hospital. Kendra Charles states that he feels that isolation and inadequate care has contributed to her decline.   Family is currently seeking placement at [facility] in [place].Family has completed "80% of the paperwork". Family explains that they do not feel she needs a locked memory care unit because she does not engage in wandering behaviors.   Pt uses a Rolator to assist with ambulation.        Pt currently admitted. TOC will continue to follow pt for any discharge needs.   [place] Transitions of Care  Clinical Social Worker  Ph: [phone]    Expected Discharge Plan: Assisted Living Barriers to Discharge: Continued Medical Work up   Patient Goals and CMS Choice Patient states their goals for this hospitalization and ongoing recovery are:: family states that their goal is to find assisted living placement for pt in a location convienent for their family      Expected Discharge Plan and Services Expected Discharge Plan: Assisted  Living In-house Referral: Clinical Social Work     Living arrangements for the past 2 months: Single Family Home(Pt has been residing with her sister, and then with daughter, and is now placed with her son, Kendra Charles.)                                      Prior Living Arrangements/Services Living arrangements for the past 2 months: Single Family Home(Pt has been residing with her sister, and then with daughter, and is now placed with her son, Kendra Charles.) Lives with:: Adult Children Patient language and need for interpreter reviewed:: Yes Do you feel safe going back to the place where you live?: No   Pts son Kendra Charles explains that with his work schedule, he is unable to provide adequate supervision for his mother  Need for Family Participation in Patient Care: Yes (Comment) Care giver support system in place?: Yes (comment) Current home services: DME Criminal Activity/Legal Involvement Pertinent to Current Situation/Hospitalization: No - Comment as needed  Activities of Daily Living      Permission Sought/Granted Permission sought to share information with : Family Supports, Case Manager Permission granted to share information with : Yes, Verbal Permission Granted  Share Information with NAME: Rochel Brome  Permission granted to share info w AGENCY: [facility] granted to share info w Relationship: Son  Permission granted to share info w Contact Information: PH: [phone]  Emotional Assessment Appearance:: Appears stated age Attitude/Demeanor/Rapport: Charismatic, Engaged Affect (typically observed):  Accepting, Calm, Appropriate Orientation: : Fluctuating Orientation (Suspected and/or reported Sundowners)(Pts son reports that patients orientation flucuates and is often worse during morning hours) Alcohol / Substance Use: Not Applicable Psych Involvement: No (comment)  Admission diagnosis:  Hyponatremia [E87.1] AKI (acute kidney injury) ([place])  [N17.9] Encounter for imaging to screen for metal prior to magnetic resonance imaging (MRI) [Z13.89] Altered mental status, unspecified altered mental status type [R41.82] Renal failure, unspecified chronicity [N19] Patient Active Problem List   Diagnosis Date Noted  . Hyponatremia 06/18/2019  . AKI (acute kidney injury) ([place]) 06/18/2019  . Dementia ([place]) 06/18/2019  . Essential hypertension 06/18/2019  . Hyperlipidemia 06/18/2019   PCP:  Patient, No Pcp Per Pharmacy:   [id number] - [place], [place] - [address] [place] [address] Phone: [phone] Fax: [phone]     Social Determinants of Health (SDOH) Interventions    Readmission Risk Interventions No flowsheet data found.

## 2019-06-18 NOTE — ED Notes (Signed)
Patient transported to MRI 

## 2019-06-18 NOTE — ED Triage Notes (Signed)
BIB [facility] from home with c/o of AMS. Per EMS pt has gone from independent to dependent within a month's time frame. Pt from the [place], moved In with family in [place], that family moved her to daughter's home in [place], now sent to live with son in [place]. Son in [place] is unsure of pt's baseline or health history, son has not seen pt in 2 years.   EMS also reports that pt had onset of slurred speech and increased confusion X 1 week ago. Friend of the family is a speech therapist and explained to family that pt more than likely had a stroke. Family has been monitoring  pt's condition.   Alert to self on arrival with slurred speech observed.

## 2019-06-18 NOTE — ED Notes (Signed)
EEG at bedside.

## 2019-06-18 NOTE — ED Notes (Signed)
Son has left patient without advising RN he was leaving unit; Son was suppose to sit with patient til transfer to unit; [place] Holter order is still in place; Pt has ripped out IV and RN will replaced prior to transfer to [facility]

## 2019-06-18 NOTE — ED Notes (Signed)
RN contacted by MRI stating patient transporter took patient to X-ray and [place] prior to bringing patient to MRI. Patient waiting and is next patient to have MRI completed.

## 2019-06-19 DIAGNOSIS — G9341 Metabolic encephalopathy: Secondary | ICD-10-CM

## 2019-06-19 DIAGNOSIS — I10 Essential (primary) hypertension: Secondary | ICD-10-CM

## 2019-06-19 DIAGNOSIS — E861 Hypovolemia: Secondary | ICD-10-CM

## 2019-06-19 DIAGNOSIS — R569 Unspecified convulsions: Secondary | ICD-10-CM

## 2019-06-19 DIAGNOSIS — E785 Hyperlipidemia, unspecified: Secondary | ICD-10-CM

## 2019-06-19 DIAGNOSIS — Z79899 Other long term (current) drug therapy: Secondary | ICD-10-CM

## 2019-06-19 DIAGNOSIS — N179 Acute kidney failure, unspecified: Secondary | ICD-10-CM

## 2019-06-19 DIAGNOSIS — E119 Type 2 diabetes mellitus without complications: Secondary | ICD-10-CM

## 2019-06-19 LAB — CBC
HCT: 34 % — ABNORMAL LOW (ref 36.0–46.0)
Hemoglobin: 11.5 g/dL — ABNORMAL LOW (ref 12.0–15.0)
MCH: 29.5 pg (ref 26.0–34.0)
MCHC: 33.8 g/dL (ref 30.0–36.0)
MCV: 87.2 fL (ref 80.0–100.0)
Platelets: 302 10*3/uL (ref 150–400)
RBC: 3.9 MIL/uL (ref 3.87–5.11)
RDW: 13.9 % (ref 11.5–15.5)
WBC: 7.1 10*3/uL (ref 4.0–10.5)
nRBC: 0 % (ref 0.0–0.2)

## 2019-06-19 LAB — BASIC METABOLIC PANEL
Anion gap: 9 (ref 5–15)
Anion gap: 9 (ref 5–15)
Anion gap: 8 (ref 5–15)
Anion gap: 7 (ref 5–15)
Anion gap: 10 (ref 5–15)
Anion gap: 5 (ref 5–15)
BUN: 21 mg/dL (ref 8–23)
BUN: 19 mg/dL (ref 8–23)
BUN: 16 mg/dL (ref 8–23)
BUN: 15 mg/dL (ref 8–23)
BUN: 12 mg/dL (ref 8–23)
BUN: 12 mg/dL (ref 8–23)
CO2: 20 mmol/L — ABNORMAL LOW (ref 22–32)
CO2: 21 mmol/L — ABNORMAL LOW (ref 22–32)
CO2: 22 mmol/L (ref 22–32)
CO2: 19 mmol/L — ABNORMAL LOW (ref 22–32)
CO2: 19 mmol/L — ABNORMAL LOW (ref 22–32)
CO2: 22 mmol/L (ref 22–32)
Calcium: 8.3 mg/dL — ABNORMAL LOW (ref 8.9–10.3)
Calcium: 8.6 mg/dL — ABNORMAL LOW (ref 8.9–10.3)
Calcium: 8.6 mg/dL — ABNORMAL LOW (ref 8.9–10.3)
Calcium: 8.5 mg/dL — ABNORMAL LOW (ref 8.9–10.3)
Calcium: 8.3 mg/dL — ABNORMAL LOW (ref 8.9–10.3)
Calcium: 8 mg/dL — ABNORMAL LOW (ref 8.9–10.3)
Chloride: 97 mmol/L — ABNORMAL LOW (ref 98–111)
Chloride: 99 mmol/L (ref 98–111)
Chloride: 100 mmol/L (ref 98–111)
Chloride: 102 mmol/L (ref 98–111)
Chloride: 102 mmol/L (ref 98–111)
Chloride: 105 mmol/L (ref 98–111)
Creatinine, Ser: 1.08 mg/dL — ABNORMAL HIGH (ref 0.44–1.00)
Creatinine, Ser: 0.95 mg/dL (ref 0.44–1.00)
Creatinine, Ser: 1.02 mg/dL — ABNORMAL HIGH (ref 0.44–1.00)
Creatinine, Ser: 0.74 mg/dL (ref 0.44–1.00)
Creatinine, Ser: 0.75 mg/dL (ref 0.44–1.00)
Creatinine, Ser: 0.82 mg/dL (ref 0.44–1.00)
GFR calc Af Amer: 58 mL/min — ABNORMAL LOW (ref >60)
GFR calc Af Amer: >60 mL/min (ref >60)
GFR calc Af Amer: >60 mL/min (ref >60)
GFR calc Af Amer: >60 mL/min (ref >60)
GFR calc Af Amer: >60 mL/min (ref >60)
GFR calc Af Amer: >60 mL/min (ref >60)
GFR calc non Af Amer: 50 mL/min — ABNORMAL LOW (ref >60)
GFR calc non Af Amer: 58 mL/min — ABNORMAL LOW (ref >60)
GFR calc non Af Amer: 53 mL/min — ABNORMAL LOW (ref >60)
GFR calc non Af Amer: >60 mL/min (ref >60)
GFR calc non Af Amer: >60 mL/min (ref >60)
GFR calc non Af Amer: >60 mL/min (ref >60)
Glucose, Bld: 87 mg/dL (ref 70–99)
Glucose, Bld: 85 mg/dL (ref 70–99)
Glucose, Bld: 123 mg/dL — ABNORMAL HIGH (ref 70–99)
Glucose, Bld: 85 mg/dL (ref 70–99)
Glucose, Bld: 83 mg/dL (ref 70–99)
Glucose, Bld: 106 mg/dL — ABNORMAL HIGH (ref 70–99)
Potassium: 3.3 mmol/L — ABNORMAL LOW (ref 3.5–5.1)
Potassium: 3.4 mmol/L — ABNORMAL LOW (ref 3.5–5.1)
Potassium: 3.7 mmol/L (ref 3.5–5.1)
Potassium: 4.7 mmol/L (ref 3.5–5.1)
Potassium: 4.4 mmol/L (ref 3.5–5.1)
Potassium: 4.3 mmol/L (ref 3.5–5.1)
Sodium: 126 mmol/L — ABNORMAL LOW (ref 135–145)
Sodium: 129 mmol/L — ABNORMAL LOW (ref 135–145)
Sodium: 130 mmol/L — ABNORMAL LOW (ref 135–145)
Sodium: 128 mmol/L — ABNORMAL LOW (ref 135–145)
Sodium: 131 mmol/L — ABNORMAL LOW (ref 135–145)
Sodium: 132 mmol/L — ABNORMAL LOW (ref 135–145)

## 2019-06-19 LAB — GLUCOSE, CAPILLARY
Glucose-Capillary: 74 mg/dL (ref 70–99)
Glucose-Capillary: 119 mg/dL — ABNORMAL HIGH (ref 70–99)
Glucose-Capillary: 115 mg/dL — ABNORMAL HIGH (ref 70–99)

## 2019-06-19 LAB — PHOSPHORUS: Phosphorus: 2.2 mg/dL — ABNORMAL LOW (ref 2.5–4.6)

## 2019-06-19 LAB — MAGNESIUM: Magnesium: 1.5 mg/dL — ABNORMAL LOW (ref 1.7–2.4)

## 2019-06-19 MED ORDER — POTASSIUM CHLORIDE CRYS ER 20 MEQ PO TBCR
20.0000 meq | EXTENDED_RELEASE_TABLET | ORAL | Status: AC
  Administered 2019-06-19 (×3): 20 meq via ORAL
  Filled 2019-06-19 (×2): qty 1

## 2019-06-19 MED ORDER — ATENOLOL 50 MG PO TABS
50.0000 mg | ORAL_TABLET | Freq: Two times a day (BID) | ORAL | Status: DC
  Administered 2019-06-20 – 2019-06-21 (×3): 50 mg via ORAL
  Filled 2019-06-19 (×5): qty 1

## 2019-06-19 MED ORDER — DEXTROSE 5 % IV SOLN: INTRAVENOUS | Status: DC

## 2019-06-19 MED ORDER — POTASSIUM CHLORIDE CRYS ER 20 MEQ PO TBCR: 20.0000 meq | EXTENDED_RELEASE_TABLET | Freq: Once | ORAL | Status: DC

## 2019-06-19 MED ORDER — MAGNESIUM SULFATE 2 GM/50ML IV SOLN
2.0000 g | Freq: Once | INTRAVENOUS | Status: AC
  Administered 2019-06-19: 10:00:00 2 g via INTRAVENOUS
  Filled 2019-06-19: qty 50

## 2019-06-19 MED ORDER — K PHOS MONO-SOD PHOS DI & MONO 155-852-130 MG PO TABS
500.0000 mg | ORAL_TABLET | Freq: Once | ORAL | Status: AC
  Administered 2019-06-19: 500 mg via ORAL
  Filled 2019-06-19: qty 2

## 2019-06-19 NOTE — TOC Progression Note (Addendum)
Transition of Care [facility]) - Progression Note    Patient Details  Name: Kendra Charles MRN: [id number] Date of Birth: Sep 02, 1941  Transition of Care [facility]) CM/SW [place], LCSW Phone Number: 06/19/2019, 5:06 PM  Clinical Narrative:    CSW spoke with patient's son and daughter in law, [place]. They stated that the ALF that they had arranged for patient to go to had ordered medications in her name that the patient is not taking and she has not even moved in yet. They are requesting a different ALF. CSW explained SNF recommendations and the difficulty in finding a Medicaid ALF bed straight from the hospital. They are open to SNF placement. CSW sent referral out and will present bed offers and ratings when available. CSW also emailed ALF resources to [place] for assistance with post-rehab search.    Expected Discharge Plan: Skilled Nursing Facility Barriers to Discharge: Continued Medical Work up  Expected Discharge Plan and Services Expected Discharge Plan: [place] In-house Referral: Clinical Social Work     Living arrangements for the past 2 months: Single Family Home(Pt has been residing with her sister, and then with daughter, and is now placed with her son, [name].)                 DME Arranged: N/A DME Agency: NA       HH Arranged: NA [place] Agency: NA         Social Determinants of Health ([place]) Interventions    Readmission Risk Interventions Readmission Risk Prevention Plan 06/19/2019  Transportation Screening Complete  PCP or Specialist Appt within 5-7 Days Complete  Home Care Screening Complete  Medication Review (RN CM) Complete

## 2019-06-19 NOTE — Progress Notes (Signed)
Subjective: Son is at bedside, patient is back to baseline  Exam: Vitals:   06/19/19 1523 06/19/19 1552  BP: 137/70 140/72  Pulse: (!) 46 (!) 46  Resp: 16 16  Temp: 97.6 F (36.4 C) (!) 97.4 F (36.3 C)  SpO2: 100% 99%   Gen: In bed, NAD Resp: non-labored breathing, no acute distress Abd: soft, nt  Neuro: MS: Awake, alert, dysarthric.  She gives year is 06/02/1988 and month as May. 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  She is now back to her impaired baseline per the son.  After discussing with him, it sounds like she has had problems progressive over several years.  I do think that dementia medicine such as donepezil would be indicated, however I do not favor starting multiple medications at the same time if this can be avoided, and therefore I would favor waiting a couple of weeks to begin this after starting the [place].  Recommendations: 1) continue Keppra 500 twice daily 2) consider donepezil in 2 weeks 3) neurology will be available on an as-needed basis moving forward, please call with further questions or concerns.  Roland Rack, MD [facility] [phone]  If 7pm- 7am, please page neurology on call as listed in [place].

## 2019-06-19 NOTE — NC FL2 (Signed)
[place] MEDICAID FL2 LEVEL OF CARE SCREENING TOOL     IDENTIFICATION  Patient Name: Kendra Charles Birthdate: 01/21/1941 Sex: female Admission Date (Current Location): 06/18/2019  [facility] and [place] Number:  Herbalist and Address:  The . [facility], [place] [address] [place], [place] [address]      Provider Number: [id number]  Attending Physician Name and Address:  Axel Filler, *  Relative Name and Phone Number:  Robert/[name], son/DIL, [phone]    Current Level of Care: Hospital Recommended Level of Care: [name] Prior Approval Number:    Date Approved/Denied:   PASRR Number: [id number]  Discharge Plan: SNF    Current Diagnoses: Patient Active Problem List   Diagnosis Date Noted  . Seizure ([name]) 06/19/2019  . Hyponatremia 06/18/2019  . AKI (acute kidney injury) ([name]) 06/18/2019  . Dementia ([name]) 06/18/2019  . Essential hypertension 06/18/2019  . Hyperlipidemia 06/18/2019    Orientation RESPIRATION BLADDER Height & Weight     Self  Normal Incontinent Weight: 134 lb (60.8 kg) Height:  5\' 5"  (165.1 cm)  BEHAVIORAL SYMPTOMS/MOOD NEUROLOGICAL BOWEL NUTRITION STATUS      Continent Diet(Please see DC Summary)  AMBULATORY STATUS COMMUNICATION OF NEEDS Skin   Limited Assist Verbally Normal                       Personal Care Assistance Level of Assistance  Bathing, Feeding, Dressing Bathing Assistance: Maximum assistance Feeding assistance: Limited assistance Dressing Assistance: Limited assistance     Functional Limitations Info  Sight, Hearing, Speech Sight Info: Adequate Hearing Info: Adequate Speech Info: Adequate    SPECIAL CARE FACTORS FREQUENCY  PT (By licensed PT), OT (By licensed OT)     PT Frequency: 5x OT Frequency: 5x            Contractures Contractures Info: Not present    Additional Factors Info  Code Status, Allergies, Insulin Sliding Scale Code Status Info:  Full Allergies Info: NKA   Insulin Sliding Scale Info: See dc summary for dose       Current Medications (06/19/2019):  This is the current hospital active medication list Current Facility-Administered Medications  Medication Dose Route Frequency Provider Last Rate Last Dose  . 0.9 %  sodium chloride infusion   Intravenous Continuous Kathi Ludwig, MD 125 mL/hr at 06/19/19 [id number]    . acetaminophen (TYLENOL) tablet 650 mg  650 mg Oral Q6H PRN Santos-Sanchez, Merlene Morse, MD       Or  . acetaminophen (TYLENOL) suppository 650 mg  650 mg Rectal Q6H PRN Santos-Sanchez, Idalys, MD      . atenolol (TENORMIN) tablet 50 mg  50 mg Oral BID Kathi Ludwig, MD   Stopped at 06/19/19 [phone]  . diazepam (VALIUM) tablet 5 mg  5 mg Oral Q12H PRN Welford Roche, MD      . heparin injection 5,000 Units  5,000 Units Subcutaneous Q8H Welford Roche, MD   5,000 Units at 06/19/19 0617  . insulin aspart (novoLOG) injection 0-9 Units  0-9 Units Subcutaneous TID WC Welford Roche, MD   Stopped at 06/18/19 1830  . levETIRAcetam (KEPPRA) IVPB 500 mg/100 mL premix  500 mg Intravenous Q12H Marliss Coots, PA-C 400 mL/hr at 06/19/19 0324 500 mg at 06/19/19 0324  . ondansetron (ZOFRAN) tablet 4 mg  4 mg Oral Q6H PRN Santos-Sanchez, Idalys, MD       Or  . ondansetron (ZOFRAN) injection 4 mg  4 mg Intravenous Q6H  PRN Welford Roche, MD      . phosphorus (K PHOS NEUTRAL) tablet 500 mg  500 mg Oral Once Kathi Ludwig, MD      . pravastatin (PRAVACHOL) tablet 40 mg  40 mg Oral q1800 Santos-Sanchez, Idalys, MD      . sodium chloride flush (NS) 0.9 % injection 3 mL  3 mL Intravenous Q12H Welford Roche, MD   3 mL at 06/18/19 1403     Discharge Medications: Please see discharge summary for a list of discharge medications.  Relevant Imaging Results:  Relevant Lab Results:   Additional Information SSN; [id number] COVID negative on 06/18/19  Benard Halsted, LCSW

## 2019-06-19 NOTE — Progress Notes (Signed)
Paged doctor to continue to hold patient's atenolol medication. Patients HR at 0800 is 52 and at 1200 HR is 58.   Oneita Kras, RN

## 2019-06-19 NOTE — Progress Notes (Signed)
Patient void about 300 ml by herself. She doesn't need In and out cath this time. Will continue monitor.

## 2019-06-19 NOTE — Progress Notes (Signed)
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 Notified on call MD Internal Medicine. MD came to the patient's  bed side. Will continue monitor the patient.

## 2019-06-19 NOTE — Evaluation (Signed)
Occupational Therapy Evaluation Patient Details Name: Kendra Charles MRN: [id number] DOB: 1941-09-22 Today's Date: 06/19/2019    History of Present Illness Pt is a 78 yo female admitted with possible sz activity.  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    Clinical Impression   Pt admitted with the above diagnosis and has the deficits listed below. Pt would benefit from cont OT to increase independence with basic adls and adl tranfers so she can safely reside in the assisted living facility that her son is in the process of securing.  Pt has lived with son for last two weeks.  If assisted living facility is available, rec HHOT in facility and if not, pt may need SNF rehab until that is secure for a safe d/c.    Follow Up Recommendations  Home health OT;Supervision/Assistance - 24 hour;Other (comment)(at assisted living (son says in process))    Equipment Recommendations  Other (comment)(tbd no family available.)    Recommendations for Other Services       Precautions / Restrictions Precautions Precautions: Fall Restrictions Weight Bearing Restrictions: No      Mobility Bed Mobility               General bed mobility comments: Pt in chair on arrival.  Transfers Overall transfer level: Needs assistance Equipment used: Rolling walker (2 wheeled) Transfers: Sit to/from [facility] Sit to Stand: Min assist Stand pivot transfers: Min assist       General transfer comment: Pt required cues for hand placement on chair and on walker    Balance Overall balance assessment: Needs assistance Sitting-balance support: Feet supported Sitting balance-Leahy Scale: Fair     Standing balance support: Bilateral upper extremity supported;During functional activity Standing balance-Leahy Scale: Poor Standing balance comment: must have outside support                           ADL either performed or assessed with clinical  judgement   ADL Overall ADL's : Needs assistance/impaired Eating/Feeding: Sitting;Minimal assistance;Cueing for sequencing Eating/Feeding Details (indicate cue type and reason): overall pt was supervision but occasionally required min assist to get food on fork to eat. Grooming: Wash/dry hands;Wash/dry face;Oral care;Sitting;Cueing for sequencing Grooming Details (indicate cue type and reason): Pt required cues on how to initiate cleaning her dentures.  This was not completed at sink which may have been confusing for pt.  Once she  got started with cup and basin, she did well. Upper Body Bathing: Minimal assistance;Sitting   Lower Body Bathing: Moderate assistance;Sit to/from stand;Cueing for sequencing   Upper Body Dressing : Minimal assistance;Sitting   Lower Body Dressing: Moderate assistance;Cueing for sequencing;Sit to/from stand;Cueing for safety Lower Body Dressing Details (indicate cue type and reason): Pt bent down to reach socks but did require assist getting clothing started over her feet.   Toilet Transfer: Minimal assistance;Stand-pivot;BSC;RW   Toileting- Clothing Manipulation and Hygiene: Total assistance;Sit to/from stand Toileting - Clothing Manipulation Details (indicate cue type and reason): Pt stood with walker while therapist managed brief.      Functional mobility during ADLs: Minimal assistance;Rolling walker General ADL Comments: Pt overall min assist with UE adls and mod assist with LE adls.      Vision Patient Visual Report: No change from baseline Vision Assessment?: No apparent visual deficits     Perception Perception Perception Tested?: No   Praxis Praxis Praxis tested?: Not tested    Pertinent Vitals/Pain Pain Assessment: No/denies  pain     Hand Dominance Right   Extremity/Trunk Assessment Upper Extremity Assessment Upper Extremity Assessment: Overall WFL for tasks assessed   Lower Extremity Assessment Lower Extremity Assessment: Defer to  PT evaluation   Cervical / Trunk Assessment Cervical / Trunk Assessment: Kyphotic   Communication Communication Communication: (slurred speech.)   Cognition Arousal/Alertness: Awake/alert Behavior During Therapy: Flat affect;Restless Overall Cognitive Status: Impaired/Different from baseline Area of Impairment: Orientation;Attention;Memory;Safety/judgement;Awareness;Problem solving                 Orientation Level: Disoriented to;Place;Time;Situation Current Attention Level: Focused Memory: Decreased recall of precautions;Decreased short-term memory   Safety/Judgement: Decreased awareness of safety;Decreased awareness of deficits Awareness: Intellectual Problem Solving: Slow processing;Difficulty sequencing;Requires verbal cues;Requires tactile cues General Comments: Pt with significant cognitive deficits.  Would require 24/7 assist at this level.   General Comments  Pt very confused with slurred speech.  Was clearer at some moments than others.  Lives with son.  Feel their plan of assisted living is [facility].    Exercises     Shoulder Instructions      Home Living Family/patient expects to be discharged to:: Assisted living                             Home Equipment: Other (comment)(unsure. No family available.)   Additional Comments: Pt lived with sister until about 2 weeks ago.  She stated to become more confused and require more care than sister could handle and went to live with her son.  Her son stated she has been declining the last few weeks and they are in the midst of doing paperwork for her to go to [facility] facility.      Prior Functioning/Environment Level of Independence: Needs assistance  Gait / Transfers Assistance Needed: walks with rollator and supervision ADL's / Homemaking Assistance Needed: Son has been assisting with "Most adls" per chart.            OT Problem List: Impaired balance (sitting and/or  standing);Decreased cognition;Decreased safety awareness;Decreased knowledge of use of DME or AE      OT Treatment/Interventions: Self-care/ADL training;DME and/or AE instruction;Therapeutic activities;Cognitive remediation/compensation    OT Goals(Current goals can be found in the care plan section) Acute Rehab OT Goals Patient Stated Goal: none state OT Goal Formulation: Patient unable to participate in goal setting Time For Goal Achievement: 07/03/19 Potential to Achieve Goals: Fair ADL Goals Pt Will Perform Eating: Independently;sitting Pt Will Perform Grooming: with supervision;standing Additional ADL Goal #1: Pt will walk to bathroom and transfer to 3:1 over commode and complete toileting skills with min assist. Additional ADL Goal #2: Pt will walk into bathroom with rollator and transfer to shower with min assist.  OT Frequency: Min 2X/week   Barriers to D/C: Decreased caregiver support  Son may not be able to handle pt at home       Co-evaluation              AM-PAC OT "6 Clicks" Daily Activity     Outcome Measure Help from another person eating meals?: A Little Help from another person taking care of personal grooming?: A Little Help from another person toileting, which includes using toliet, bedpan, or urinal?: A Lot Help from another person bathing (including washing, rinsing, drying)?: A Lot Help from another person to put on and taking off regular upper body clothing?: A Little Help from another person to put on and  taking off regular lower body clothing?: A Lot 6 Click Score: 15   End of Session Equipment Utilized During Treatment: Surveyor, mining Communication: Mobility status  Activity Tolerance: Patient tolerated treatment well Patient left: in chair;with call bell/phone within reach;with chair alarm set;with nursing/sitter in room  OT Visit Diagnosis: Unsteadiness on feet (R26.81);Other symptoms and signs involving cognitive function                 Time: 1034-1100 OT Time Calculation (min): 26 min Charges:  OT General Charges $OT Visit: 1 Visit OT Evaluation $OT Eval Moderate Complexity: 1 Mod OT Treatments $Self Care/Home Management : 8-22 mins  Jinger Neighbors, OTR/L [id number]  Glenford Peers 06/19/2019, 11:18 AM

## 2019-06-19 NOTE — Progress Notes (Signed)
   Subjective:  Seen at the bedside on rounds this AM. Patient resting in bed. Patient denies being in pain. Appears disoriented, saying she not sure where she is. When asked if she is at home or elsewhere she says "somewhere else". I asked several more questions, but the patient did not reply.  Objective:  Vital signs in last 24 hours: Vitals:   06/18/19 2125 06/18/19 2206 06/19/19 0407 06/19/19 0615  BP: 130/75   130/65  Pulse: 66   (!) 56  Resp: 16 18 16 16   Temp: 99.4 F (37.4 C)   97.6 F (36.4 C)  TempSrc: Oral   Oral  SpO2: 98%   93%  Weight:  60.8 kg 60.8 kg   Height:  5\' 5"  (1.651 m)     Physical Exam: General: Elderly appearing female resting comfortably in bed.  HEENT: NCAT CV: RRR, Normal S1 & S2, no murmurs, rubs or gallops appreciated PULM: CTAB, no crackles or wheezing appreciated.  NEURO: Alert but not oriented to place or date. Moves all 4 extremities spontaneously. No focal deficits noted.  Assessment/Plan:  Principal Problem:   Seizure ([place]) Active Problems:   Hyponatremia   AKI (acute kidney injury) ([place])   Dementia (H[facility])  Kendra Charles is a 78 year old female with a history of dementia, T2DM, HTN, and HLD who presents from home with acute altered mental status the morning of 9/4 after reportedly seen fully body convulsions on the toilet.   #Dementia #Acute metabolic encephalopathy: At baseline, the patient has had a decline in mental status and function for the last 2 months. She hasn't been seen by a doctor in several years. Family reportedly found facility, [facility], where she will stay permanently after d/c. From a medical standpoint, the patient's presentation was concerning for seizures, given the report of full body convulsions in the bathroom when she was found unresponsive. She is currently hyponatremic with a sodium of 125. 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 EEG showed cortical  dysfunction of the R frontotemporal region, likely secondary to underlying chronic structural disease. - Appreciate Neurology recommendations:  - IV Keppra 500 mg q12hr   - Diazepam 5 mg q12hr PRN  # Hypovolemic, hypotonic hyponatremia: I suspect this is secondary to poor oral intake she appears hypovolemic on exam.  She received 1L bolus of NS in the ED.  - NS 75 cc/hr  - Serial BMP q4hr to recheck sodium levels  #CKD? # AKI:  Her creatinine on admission was 2.5 (Baseline unknown) - NS 75 cc/hr  - follow up BMP  #HTN: On home HCTZ & Lisinopril. Has been on Atenolol 100 mg, however this medication has been held due to normotensive status and mild bradycardia in the upper 50s.  - Decrease Atenolol to 50 mg - Continue to hold home HCTZ & Lisinopril   # HLD - Lovastatin 40 mg daily  #FEN/GI - IVF: NS 75cc/hr  - Diet: Thin liquids   #DVT prophylaxis:  - SQ heparin 5000U q8h   #Code status: FULL CODE, confirmed on admission.   Son: Rochel Brome [phone] and [phone]  Daughter: Lennie Muckle [phone]  Dispo: Admit patient to Inpatient with expected length of stay greater than 2 midnights.  Earlene Plater, MD Internal Medicine, PGY1 Pager: [phone]  06/19/2019,12:39 PM

## 2019-06-19 NOTE — Evaluation (Signed)
Physical Therapy Evaluation Patient Details Name: Catalea Bouvia MRN: [id number] DOB: 02-24-1941 Today's Date: 06/19/2019   History of Present Illness  Pt is a 78 yo female admitted with possible sz activity.  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   Clinical Impression  Patient received in recliner with nursing sitter present. Patient agrees to PT. Slurred speech, confused. Requires min assist for sit to stand and walking with RW. Decreased safety awareness, continually veers to the left and requires assistance to avoid obstacles. Takes very small, shuffle steps.  Cues needed for safe use of RW. Patient will benefit from continued skilled PT to address mobility issues and safety issues.     Follow Up Recommendations SNF    Equipment Recommendations  None recommended by PT    Recommendations for Other Services       Precautions / Restrictions Precautions Precautions: Fall Restrictions Weight Bearing Restrictions: No      Mobility  Bed Mobility               General bed mobility comments: patient in recliner  Transfers Overall transfer level: Needs assistance Equipment used: Rolling walker (2 wheeled) Transfers: Sit to/from Stand Sit to Stand: Min assist Stand pivot transfers: Min assist       General transfer comment: Pt required cues for hand placement on chair and on walker, initially started walking with just right hand on walker  Ambulation/Gait Ambulation/Gait assistance: Min assist Gait Distance (Feet): 60 Feet Assistive device: Rolling walker (2 wheeled) Gait Pattern/deviations: Step-to pattern;Shuffle;Decreased stride length;Trunk flexed;Drifts right/left Gait velocity: decreased   General Gait Details: patient requires cues to stay within RW and close to RW. Cues given to increase step length, which she did for about two steps then reverted back to shuffling tiny steps.  Stairs            Wheelchair Mobility     Modified Rankin (Stroke Patients Only)       Balance Overall balance assessment: Needs assistance Sitting-balance support: Feet supported Sitting balance-Leahy Scale: Fair     Standing balance support: Bilateral upper extremity supported Standing balance-Leahy Scale: Fair Standing balance comment: must have outside support                             Pertinent Vitals/Pain Pain Assessment: No/denies pain    Home Living Family/patient expects to be discharged to:: Assisted living               Home Equipment: Walker - 4 wheels Additional Comments: Pt lived with sister until about 2 weeks ago.  She stated to become more confused and require more care than sister could handle and went to live with her son.  Her son stated she has been declining the last few weeks and they are in the midst of doing paperwork for her to go to [facility] facility.    Prior Function Level of Independence: Needs assistance   Gait / Transfers Assistance Needed: walks with rollator and supervision  ADL's / Homemaking Assistance Needed: Son has been assisting with "Most adls" per chart.        Hand Dominance   Dominant Hand: Right    Extremity/Trunk Assessment   Upper Extremity Assessment Upper Extremity Assessment: Overall WFL for tasks assessed    Lower Extremity Assessment Lower Extremity Assessment: Generalized weakness    Cervical / Trunk Assessment Cervical / Trunk Assessment: Kyphotic  Communication   Communication:  Other (comment)(slurred speech at times)  Cognition Arousal/Alertness: Awake/alert Behavior During Therapy: Flat affect Overall Cognitive Status: No family/caregiver present to determine baseline cognitive functioning Area of Impairment: Orientation;Attention;Memory;Following commands;Safety/judgement;Awareness;Problem solving                 Orientation Level: Time;Situation;Disoriented to Current Attention Level:  Focused Memory: Decreased short-term memory Following Commands: Follows multi-step commands inconsistently Safety/Judgement: Decreased awareness of safety;Decreased awareness of deficits Awareness: Intellectual Problem Solving: Slow processing;Decreased initiation;Difficulty sequencing;Requires verbal cues;Requires tactile cues General Comments: Pt with significant cognitive deficits.  Would require 24/7 assist at this level.      General Comments General comments (skin integrity, edema, etc.): Pt very confused with slurred speech.  Was clearer at some moments than others.  Lives with son.  Feel their plan of assisted living is [facility].    Exercises     Assessment/Plan    PT Assessment Patient needs continued PT services  PT Problem List Decreased strength;Decreased mobility;Decreased safety awareness;Decreased activity tolerance;Decreased balance;Decreased cognition;Decreased knowledge of use of DME       PT Treatment Interventions Therapeutic activities;Therapeutic exercise;Gait training;Patient/family education;Functional mobility training    PT Goals (Current goals can be found in the Care Plan section)  Acute Rehab PT Goals Patient Stated Goal: none state Time For Goal Achievement: 06/26/19 Potential to Achieve Goals: Fair    Frequency Min 2X/week   Barriers to discharge Decreased caregiver support      Co-evaluation               AM-PAC PT "6 Clicks" Mobility  Outcome Measure Help needed turning from your back to your side while in a flat bed without using bedrails?: A Little Help needed moving from lying on your back to sitting on the side of a flat bed without using bedrails?: A Little Help needed moving to and from a bed to a chair (including a wheelchair)?: A Little Help needed standing up from a chair using your arms (e.g., wheelchair or bedside chair)?: A Little Help needed to walk in hospital room?: A Lot Help needed climbing 3-5 steps with a  railing? : A Lot 6 Click Score: 16    End of Session Equipment Utilized During Treatment: Gait belt Activity Tolerance: Patient tolerated treatment well Patient left: in chair;with chair alarm set;with call bell/phone within reach Nurse Communication: Mobility status PT Visit Diagnosis: Muscle weakness (generalized) (M62.81);Difficulty in walking, not elsewhere classified (R26.2);Other abnormalities of gait and mobility (R26.89)    Time: 1130-1149 PT Time Calculation (min) (ACUTE ONLY): 19 min   Charges:   PT Evaluation $PT Eval Moderate Complexity: 1 Mod PT Treatments $Gait Training: 8-22 mins        Deloss Amico, PT, GCS 06/19/19,12:00 PM

## 2019-06-19 NOTE — Progress Notes (Signed)
Patient hr remains in 40-50s. bp 140/72 Remains asymptomatic  Updated md and wants to continue to watch patient at this time. No new orders

## 2019-06-20 ENCOUNTER — Inpatient Hospital Stay (HOSPITAL_COMMUNITY): Payer: Medicare Other

## 2019-06-20 DIAGNOSIS — I4891 Unspecified atrial fibrillation: Secondary | ICD-10-CM

## 2019-06-20 LAB — GLUCOSE, CAPILLARY
Glucose-Capillary: 90 mg/dL (ref 70–99)
Glucose-Capillary: 101 mg/dL — ABNORMAL HIGH (ref 70–99)
Glucose-Capillary: 114 mg/dL — ABNORMAL HIGH (ref 70–99)
Glucose-Capillary: 139 mg/dL — ABNORMAL HIGH (ref 70–99)

## 2019-06-20 LAB — BASIC METABOLIC PANEL
Anion gap: 9 (ref 5–15)
Anion gap: 9 (ref 5–15)
BUN: 12 mg/dL (ref 8–23)
BUN: 6 mg/dL — ABNORMAL LOW (ref 8–23)
CO2: 19 mmol/L — ABNORMAL LOW (ref 22–32)
CO2: 23 mmol/L (ref 22–32)
Calcium: 8.2 mg/dL — ABNORMAL LOW (ref 8.9–10.3)
Calcium: 8.7 mg/dL — ABNORMAL LOW (ref 8.9–10.3)
Chloride: 104 mmol/L (ref 98–111)
Chloride: 100 mmol/L (ref 98–111)
Creatinine, Ser: 0.67 mg/dL (ref 0.44–1.00)
Creatinine, Ser: 0.59 mg/dL (ref 0.44–1.00)
GFR calc Af Amer: >60 mL/min (ref >60)
GFR calc Af Amer: >60 mL/min (ref >60)
GFR calc non Af Amer: >60 mL/min (ref >60)
GFR calc non Af Amer: >60 mL/min (ref >60)
Glucose, Bld: 84 mg/dL (ref 70–99)
Glucose, Bld: 148 mg/dL — ABNORMAL HIGH (ref 70–99)
Potassium: 4 mmol/L (ref 3.5–5.1)
Potassium: 3.8 mmol/L (ref 3.5–5.1)
Sodium: 132 mmol/L — ABNORMAL LOW (ref 135–145)
Sodium: 132 mmol/L — ABNORMAL LOW (ref 135–145)

## 2019-06-20 LAB — MAGNESIUM: Magnesium: 1.7 mg/dL (ref 1.7–2.4)

## 2019-06-20 LAB — APTT: aPTT: 195 seconds (ref 24–36)

## 2019-06-20 LAB — CBC
HCT: 38.2 % (ref 36.0–46.0)
Hemoglobin: 13.1 g/dL (ref 12.0–15.0)
MCH: 29.8 pg (ref 26.0–34.0)
MCHC: 34.3 g/dL (ref 30.0–36.0)
MCV: 86.8 fL (ref 80.0–100.0)
Platelets: 359 10*3/uL (ref 150–400)
RBC: 4.4 MIL/uL (ref 3.87–5.11)
RDW: 14.2 % (ref 11.5–15.5)
WBC: 8.8 10*3/uL (ref 4.0–10.5)
nRBC: 0 % (ref 0.0–0.2)

## 2019-06-20 LAB — PROTIME-INR
INR: 1.1 (ref 0.8–1.2)
Prothrombin Time: 13.9 seconds (ref 11.4–15.2)

## 2019-06-20 LAB — HEPARIN LEVEL (UNFRACTIONATED): Heparin Unfractionated: 0.47 IU/mL (ref 0.30–0.70)

## 2019-06-20 LAB — URINE CULTURE: Culture: 40000 — AB

## 2019-06-20 LAB — PHOSPHORUS: Phosphorus: 2.1 mg/dL — ABNORMAL LOW (ref 2.5–4.6)

## 2019-06-20 MED ORDER — MAGNESIUM SULFATE IN D5W 1-5 GM/100ML-% IV SOLN
1.0000 g | Freq: Once | INTRAVENOUS | Status: AC
  Administered 2019-06-20: 06:00:00 1 g via INTRAVENOUS
  Filled 2019-06-20: qty 100

## 2019-06-20 MED ORDER — HEPARIN BOLUS VIA INFUSION
3000.0000 [IU] | Freq: Once | INTRAVENOUS | Status: AC
  Administered 2019-06-20: 3000 [IU] via INTRAVENOUS
  Filled 2019-06-20: qty 3000

## 2019-06-20 MED ORDER — LISINOPRIL 20 MG PO TABS
30.0000 mg | ORAL_TABLET | Freq: Every day | ORAL | Status: DC
  Administered 2019-06-20 – 2019-06-25 (×6): 30 mg via ORAL
  Filled 2019-06-20 (×6): qty 1

## 2019-06-20 MED ORDER — HEPARIN (PORCINE) 25000 UT/250ML-% IV SOLN
900.0000 [IU]/h | INTRAVENOUS | Status: DC
  Administered 2019-06-20: 14:00:00 900 [IU]/h via INTRAVENOUS

## 2019-06-20 MED ORDER — POTASSIUM & SODIUM PHOSPHATES 280-160-250 MG PO PACK
2.0000 | PACK | ORAL | Status: AC
  Administered 2019-06-20 (×3): 2 via ORAL
  Filled 2019-06-20 (×3): qty 2

## 2019-06-20 MED ORDER — APIXABAN 5 MG PO TABS: 5.0000 mg | ORAL_TABLET | Freq: Two times a day (BID) | ORAL | Status: DC

## 2019-06-20 MED ORDER — POTASSIUM PHOSPHATES 15 MMOLE/5ML IV SOLN
40.0000 meq | Freq: Once | INTRAVENOUS | Status: AC
  Administered 2019-06-20: 11:00:00 40 meq via INTRAVENOUS
  Filled 2019-06-20: qty 9.09

## 2019-06-20 MED ORDER — DILTIAZEM HCL-DEXTROSE 100-5 MG/100ML-% IV SOLN (PREMIX)
5.0000 mg/h | INTRAVENOUS | Status: DC
  Administered 2019-06-20 – 2019-06-21 (×2): 5 mg/h via INTRAVENOUS
  Filled 2019-06-20 (×2): qty 100

## 2019-06-20 MED ORDER — HEPARIN (PORCINE) 25000 UT/250ML-% IV SOLN
900.0000 [IU]/h | INTRAVENOUS | Status: DC
  Administered 2019-06-20: 900 [IU]/h via INTRAVENOUS
  Filled 2019-06-20: qty 250

## 2019-06-20 MED ORDER — LEVETIRACETAM 500 MG PO TABS
500.0000 mg | ORAL_TABLET | Freq: Two times a day (BID) | ORAL | Status: DC
  Administered 2019-06-20 – 2019-06-23 (×6): 500 mg via ORAL
  Filled 2019-06-20 (×3): qty 1
  Filled 2019-06-20: qty 2
  Filled 2019-06-20 (×3): qty 1

## 2019-06-20 NOTE — Progress Notes (Signed)
Patient found out of bed, pulled 2 IIV's out, and telemetry box found off,  Clean patient and assist back to bed, meds on hold until IV is restarted by IV team

## 2019-06-20 NOTE — Progress Notes (Signed)
Subjective: Patient seen at the bedside on rounds this morning.  Appears comfortable sleeping in bed.  Says she has no discomfort at this time. Feels hungry and wants to eat.  Objective:  Vital signs in last 24 hours: Vitals:   06/19/19 1552 06/19/19 2204 06/20/19 0500 06/20/19 0616  BP: 140/72 139/68  (!) 155/85  Pulse: (!) 46 (!) 52  64  Resp: 16 14  20   Temp: (!) 97.4 F (36.3 C) (!) 97.5 F (36.4 C)  98.3 F (36.8 C)  TempSrc:  Oral    SpO2: 99% 98%  92%  Weight:   60.3 kg   Height:       Physical Exam: General: Elderly appearing female resting in bed comfortably.  NAD HEENT: NCAT CV: Irregular rhythm, S1 and S2 appreciated, no murmurs, rubs or gallops heard PULM: Normal work of breathing, lungs clear to auscultation bilaterally.  No crackles or wheezes heard. Neuro: Alert but not oriented to place or date.  No focal deficits.  Psych: Pleasant mood, normal affect  Assessment/Plan:  Principal Problem:   Seizure ([place]) Active Problems:   Hyponatremia   AKI (acute kidney injury) ([place])   Dementia (HCC)  Kendra Charles is a 78 year old female with a history of dementia,T2DM, HTN, and HLDwho presents from home with acute altered mental status the morning of 9/4 after reportedly seen fully body convulsions on the toilet.   #Dementia #Acute metabolic encephalopathy:At baseline, the patient has had a decline in mental status and function for the last 2 months. She hasn't been seen by a doctor in several years. Family reportedly found facility, [facility], where she will stay permanently after d/c.From a medical standpoint, the patient's presentation was concerning for seizures, given the report of full body convulsions in the bathroom when she was found unresponsive. She is currently hyponatremic with a sodium of 125. 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 EEG showed cortical dysfunction of the R frontotemporal  region, likely secondary to underlying chronic structural disease. -Appreciate Neurology recommendations:             - IV Keppra 500 mg q12hr              - Diazepam 5 mg q12hr PRN  #Hypovolemic, hypotonic hyponatremia:Slightly hyponatremic to 132 (x3 on last 3 BMPs). The patient appears euvolemic on exam, and I suspect she will be able to correct her sodium levels with increased oral intake. The patient feels hungry and wants to eat.  - d/c IVF - Encourage oral intake - BMP in AM  #New Onset A-fib: Irregular rhythm on physical exam and seen on telemetry. EKG notable for a-fib with RVR. CHA2DS2VASC score of 6 (9.7% stroke risk) indicating need for anticoagulation. - TTE ordered - Heparin dosing per pharmacy - Rate control with Atenolol 50 mg. Will titrate PRN.  # [id number] creatinine on admissionwas 2.5 (Baseline unknown), now  0.67. - Encourage PO intake  #HTN: On home HCTZ & Lisinopril. Has been on Atenolol 100 mg, however this medication has been held due to normotensive status and mild bradycardia in the upper 50s. She has been hypertensive the last 24hrs. Since her AKI is resolved we will start home lisinopril.  - Atenolol to 50 mg daily - Start home lisinopril 30 mg daily - Continue to hold home HCTZ, do not want to make hyponatremia worse.   # HLD - Lovastatin 40 mg daily  #FEN/GI - IVF: NS  75cc/hr   - Diet: Thin liquid. Consult SLP for swallow evaluation prior to advancing diet and increasing oral intake.   #DVT prophylaxis:  - Heparin dosing per pharmacy ordered   #Code status: FULL CODE, confirmed on admission.   Son: Kendra Charles [phone] and [phone]  Daughter: Kendra Charles [phone]  Dispo: pending medical progress. SW working to find SNF. Family aware and in agreement.   Earlene Plater, MD Internal Medicine, PGY1 Pager: [phone]  06/20/2019,12:11 PM

## 2019-06-20 NOTE — Evaluation (Signed)
Clinical/Bedside Swallow Evaluation Patient Details  Name: Kendra Charles MRN: [id number] Date of Birth: 06-28-1941  Today's Date: 06/20/2019 Time: SLP Start Time (ACUTE ONLY): 50 SLP Stop Time (ACUTE ONLY): 1135 SLP Time Calculation (min) (ACUTE ONLY): 30 min  Past Medical History:  Past Medical History:  Diagnosis Date  . Hypercholesteremia   . Hypertension    Past Surgical History: History reviewed. No pertinent surgical history. HPI:  Ms Kendra Charles, 77y/f, presented to ER with altered mental status and full body convulsions, likely a seizure. Son reports on 9/4 his mother is back to her baseline. PMH of dementia, T2DM, HTN, and HLD. No history of dysphagia.   Assessment / Plan / Recommendation Clinical Impression  Clinical swallow evaluation completed. Patient has no history of dysphagia. She presents with normal swallowing function.  Oral motor exam was WNL. Pt has no teeth or dentures on the bottom and upper denture is broken. PO trials given (ice chips, thin liquid, puree, cracker) with no s/sx of aspiration. Mastication of soft solids was prolonged, but effective. Recommend Reg, thin liquid diet. Medications given whole with liquids. No further ST services recommended at this time.  SLP Visit Diagnosis: Dysphagia, unspecified (R13.10)    Aspiration Risk  No limitations    Diet Recommendation Regular;Thin liquid   Liquid Administration via: Cup;Straw Medication Administration: Whole meds with liquid Supervision: Patient able to self feed Compensations: Slow rate;Small sips/bites Postural Changes: Seated upright at 90 degrees    Other  Recommendations Oral Care Recommendations: Oral care BID                       Prognosis Prognosis for Safe Diet Advancement: Good Barriers to Reach Goals: Cognitive deficits      Swallow Study   General Date of Onset: 06/18/19 HPI: Ms Kendra Charles, 77y/f, presented to ER with altered mental status and full body  convulsions, likely a seizure. Son reports on 9/4 his mother is back to her baseline. PMH of dementia, T2DM, HTN, and HLD. No history of dysphagia. Type of Study: Bedside Swallow Evaluation Previous Swallow Assessment: none Diet Prior to this Study: Regular;Thin liquids Temperature Spikes Noted: No Respiratory Status: Room air History of Recent Intubation: No Behavior/Cognition: Alert;Cooperative Oral Cavity Assessment: Within Functional Limits Oral Care Completed by SLP: No Oral Cavity - Dentition: Dentures, top(broken dentures) Vision: Functional for self-feeding Self-Feeding Abilities: Able to feed self Patient Positioning: Upright in bed Baseline Vocal Quality: Normal Volitional Cough: Strong Volitional Swallow: Able to elicit    Oral/Motor/Sensory Function Overall Oral Motor/Sensory Function: Within functional limits   Ice Chips Ice chips: Within functional limits Presentation: Spoon   Thin Liquid Thin Liquid: Within functional limits Presentation: Cup;Straw    Nectar Thick Nectar Thick Liquid: Not tested   Honey Thick Honey Thick Liquid: Not tested   Puree Puree: Within functional limits   Solid     Solid: Within functional limits     [place], MA, CCC-SLP 06/20/2019 11:38 AM

## 2019-06-20 NOTE — Progress Notes (Addendum)
Addendum:  Heparin order was discontinued in [facility] and pharmacy re-consulted for heparin for Afib. Spoke with provider and plan to continue on IV heparin for now. Per provider and nursing heparin was never stopped. Will continue with current plan of heparin 900 units/hr and heparin level at 2130.    ANTICOAGULATION CONSULT NOTE - Initial Consult  Pharmacy Consult for Heparin Indication: new onset atrial fibrillation  No Known Allergies  Patient Measurements: Height: 5\' 5"  (165.1 cm) Weight: 133 lb (60.3 kg) IBW/kg (Calculated) : 57 Heparin Dosing Weight: 60.8kg   Vital Signs: Temp: 98.3 F (36.8 C) (09/05 1249) Temp Source: Oral (09/05 1249) BP: 159/106 (09/05 1249) Pulse Rate: 149 (09/05 1249)  Labs: Recent Labs    06/18/19 0615 06/18/19 1354  06/19/19 0632  06/19/19 1809 06/19/19 2215 06/20/19 0231  HGB 13.6  --   --  11.5*  --   --   --   --   HCT 39.4  --   --  34.0*  --   --   --   --   PLT 291  --   --  302  --   --   --   --   CREATININE 2.50* 1.68*   < > 0.95   < > 0.75 0.82 0.67  CKTOTAL  --  307*  --   --   --   --   --   --    < > = values in this interval not displayed.    Estimated Creatinine Clearance: 53.8 mL/min (by C-G formula based on SCr of 0.67 mg/dL).   Medical History: Past Medical History:  Diagnosis Date  . Hypercholesteremia   . Hypertension     Medications:  Scheduled:  . atenolol  50 mg Oral BID  . heparin  5,000 Units Subcutaneous Q8H  . insulin aspart  0-9 Units Subcutaneous TID WC  . levETIRAcetam  500 mg Oral BID  . lisinopril  30 mg Oral Daily  . potassium & sodium phosphates  2 packet Oral Q4H  . pravastatin  40 mg Oral q1800  . sodium chloride flush  3 mL Intravenous Q12H    Assessment: 78 yo female found to have new onset atrial fibrillation. Pharmacy consulted to dose heparin for atrial fibrillation. CHAD2-VASc score of 6. No anticoagulation prior to admission. Hgb 11.5 on 9/5 and plt wnl. Will obtain CBC today. No  reported bleeding.   Goal of Therapy:  Heparin level 0.3-0.7 units/ml Monitor platelets by anticoagulation protocol: Yes   Plan:  Give 3000 units bolus x 1 Start heparin infusion at 900 units/hr  Obtain baseline aPTT, PT, INR Check 8 hour heparin level at 2130 on 9/5 Monitor heparin level, CBC, and S/S of bleeding daily  Follow up plan for long term anticoagulation  Cristela Felt, PharmD PGY1 Pharmacy Resident [employer]: [phone]  06/20/2019,12:52 PM

## 2019-06-20 NOTE — Progress Notes (Signed)
[place] for Heparin Indication: new onset atrial fibrillation  No Known Allergies  Patient Measurements: Height: 5\' 5"  (165.1 cm) Weight: 133 lb (60.3 kg) IBW/kg (Calculated) : 57 Heparin Dosing Weight: 60.8kg   Vital Signs: Temp: 97.9 F (36.6 C) (09/05 2125) Temp Source: Oral (09/05 2125) BP: 131/87 (09/05 2125) Pulse Rate: 96 (09/05 1607)  Labs: Recent Labs    06/18/19 0615 06/18/19 1354  06/19/19 [id number]  06/19/19 2215 06/20/19 0231 06/20/19 1501 06/20/19 2140  HGB 13.6  --   --  11.5*  --   --   --  13.1  --   HCT 39.4  --   --  34.0*  --   --   --  38.2  --   PLT 291  --   --  302  --   --   --  359  --   APTT  --   --   --   --   --   --   --  195*  --   LABPROT  --   --   --   --   --   --   --  13.9  --   INR  --   --   --   --   --   --   --  1.1  --   HEPARINUNFRC  --   --   --   --   --   --   --   --  0.47  CREATININE 2.50* 1.68*   < > 0.95   < > 0.82 0.67 0.59  --   CKTOTAL  --  307*  --   --   --   --   --   --   --    < > = values in this interval not displayed.    Estimated Creatinine Clearance: 53.8 mL/min (by C-G formula based on SCr of 0.59 mg/dL).   Medical History: Past Medical History:  Diagnosis Date  . Hypercholesteremia   . Hypertension     Medications:  Scheduled:  . atenolol  50 mg Oral BID  . insulin aspart  0-9 Units Subcutaneous TID WC  . levETIRAcetam  500 mg Oral BID  . lisinopril  30 mg Oral Daily  . pravastatin  40 mg Oral q1800  . sodium chloride flush  3 mL Intravenous Q12H    Assessment: 78 yo female found to have new onset atrial fibrillation. Pharmacy consulted to dose heparin for atrial fibrillation. CHAD2-VASc score of 6. No anticoagulation prior to admission. Hgb 11.5 on 9/5 and plt wnl. Will obtain CBC today. No reported bleeding.  Initial heparin level 0.47 units/ml  Goal of Therapy:  Heparin level 0.3-0.7 units/ml Monitor platelets by anticoagulation protocol: Yes    Plan:  Continue heparin at 900 units/hr  Monitor heparin level, CBC, and S/S of bleeding daily  Follow up plan for long term anticoagulation  Thanks for allowing pharmacy to be a part of this patient's care.  Excell Seltzer, PharmD Clinical Pharmacist  06/20/2019,10:27 PM

## 2019-06-20 NOTE — Progress Notes (Signed)
Called the covering MD in regards to what looks like a prolapsed uterus. MD aware.

## 2019-06-20 NOTE — Progress Notes (Signed)
  Echocardiogram 2D Echocardiogram was attempted but patients heart rate was in the 150's-170's. We will try again tomorrow.   Kendra Charles 06/20/2019, 2:17 PM

## 2019-06-21 ENCOUNTER — Inpatient Hospital Stay (HOSPITAL_COMMUNITY): Payer: Medicare Other

## 2019-06-21 DIAGNOSIS — I351 Nonrheumatic aortic (valve) insufficiency: Secondary | ICD-10-CM

## 2019-06-21 DIAGNOSIS — I361 Nonrheumatic tricuspid (valve) insufficiency: Secondary | ICD-10-CM

## 2019-06-21 LAB — CBC
HCT: 38.1 % (ref 36.0–46.0)
Hemoglobin: 13 g/dL (ref 12.0–15.0)
MCH: 29.7 pg (ref 26.0–34.0)
MCHC: 34.1 g/dL (ref 30.0–36.0)
MCV: 87.2 fL (ref 80.0–100.0)
Platelets: 333 10*3/uL (ref 150–400)
RBC: 4.37 MIL/uL (ref 3.87–5.11)
RDW: 14.2 % (ref 11.5–15.5)
WBC: 10.5 10*3/uL (ref 4.0–10.5)
nRBC: 0 % (ref 0.0–0.2)

## 2019-06-21 LAB — BASIC METABOLIC PANEL
Anion gap: 11 (ref 5–15)
Anion gap: 13 (ref 5–15)
BUN: 5 mg/dL — ABNORMAL LOW (ref 8–23)
BUN: 6 mg/dL — ABNORMAL LOW (ref 8–23)
CO2: 21 mmol/L — ABNORMAL LOW (ref 22–32)
CO2: 22 mmol/L (ref 22–32)
Calcium: 8.9 mg/dL (ref 8.9–10.3)
Calcium: 9.1 mg/dL (ref 8.9–10.3)
Chloride: 100 mmol/L (ref 98–111)
Chloride: 99 mmol/L (ref 98–111)
Creatinine, Ser: 0.63 mg/dL (ref 0.44–1.00)
Creatinine, Ser: 0.64 mg/dL (ref 0.44–1.00)
GFR calc Af Amer: >60 mL/min (ref >60)
GFR calc Af Amer: >60 mL/min (ref >60)
GFR calc non Af Amer: >60 mL/min (ref >60)
GFR calc non Af Amer: >60 mL/min (ref >60)
Glucose, Bld: 101 mg/dL — ABNORMAL HIGH (ref 70–99)
Glucose, Bld: 120 mg/dL — ABNORMAL HIGH (ref 70–99)
Potassium: 3.5 mmol/L (ref 3.5–5.1)
Potassium: 3.6 mmol/L (ref 3.5–5.1)
Sodium: 132 mmol/L — ABNORMAL LOW (ref 135–145)
Sodium: 134 mmol/L — ABNORMAL LOW (ref 135–145)

## 2019-06-21 LAB — GLUCOSE, CAPILLARY
Glucose-Capillary: 86 mg/dL (ref 70–99)
Glucose-Capillary: 134 mg/dL — ABNORMAL HIGH (ref 70–99)
Glucose-Capillary: 101 mg/dL — ABNORMAL HIGH (ref 70–99)
Glucose-Capillary: 89 mg/dL (ref 70–99)

## 2019-06-21 LAB — ECHOCARDIOGRAM COMPLETE
Height: 65 in
Weight: 2128 oz

## 2019-06-21 LAB — PHOSPHORUS: Phosphorus: 3.4 mg/dL (ref 2.5–4.6)

## 2019-06-21 LAB — HEPARIN LEVEL (UNFRACTIONATED): Heparin Unfractionated: 0.54 IU/mL (ref 0.30–0.70)

## 2019-06-21 LAB — MAGNESIUM: Magnesium: 1.6 mg/dL — ABNORMAL LOW (ref 1.7–2.4)

## 2019-06-21 LAB — MRSA PCR SCREENING: MRSA by PCR: NEGATIVE

## 2019-06-21 MED ORDER — APIXABAN 5 MG PO TABS
5.0000 mg | ORAL_TABLET | Freq: Two times a day (BID) | ORAL | Status: DC
  Administered 2019-06-21 – 2019-06-25 (×9): 5 mg via ORAL
  Filled 2019-06-21 (×8): qty 1

## 2019-06-21 MED ORDER — METOPROLOL TARTRATE 25 MG PO TABS: 25.0000 mg | ORAL_TABLET | Freq: Two times a day (BID) | ORAL | Status: DC

## 2019-06-21 MED ORDER — DILTIAZEM HCL-DEXTROSE 100-5 MG/100ML-% IV SOLN (PREMIX)
5.0000 mg/h | INTRAVENOUS | Status: DC
  Administered 2019-06-21 – 2019-06-22 (×2): 5 mg/h via INTRAVENOUS
  Filled 2019-06-21: qty 100

## 2019-06-21 MED ORDER — MAGNESIUM SULFATE 2 GM/50ML IV SOLN
2.0000 g | Freq: Once | INTRAVENOUS | Status: AC
  Administered 2019-06-21: 10:00:00 2 g via INTRAVENOUS
  Filled 2019-06-21: qty 50

## 2019-06-21 MED ORDER — METOPROLOL TARTRATE 50 MG PO TABS
50.0000 mg | ORAL_TABLET | Freq: Two times a day (BID) | ORAL | Status: DC
  Administered 2019-06-21 – 2019-06-24 (×6): 50 mg via ORAL
  Filled 2019-06-21 (×7): qty 1

## 2019-06-21 NOTE — Progress Notes (Signed)
  Echocardiogram 2D Echocardiogram has been performed.  Armondo Cech L Androw 06/21/2019, 9:57 AM

## 2019-06-21 NOTE — Progress Notes (Addendum)
Patient noted to have prolapse last night by [place], NT. Tonight, Paige, NT states that prolapse appears larger and bleeding small amt. Prolapse is large, round, and pink with quarter-sized red beefy area to end. Gilford Rile, MD notified via text page. Will continue to monitor.  Gilford Rile, MD returned call. Continue to monitor. No new orders at this time.

## 2019-06-21 NOTE — Progress Notes (Addendum)
Per tele, patient sustaining SB. HR noted at 45-60. Cardizem IV gtt paused. Patient sleeping, no distress noted. Per tele, patient had 2 second pause earlier, now 2.49 second pause. Gilford Rile, MD notified via text page. Will continue to monitor.

## 2019-06-21 NOTE — Progress Notes (Signed)
Subjective: The patient stated today that she has been told in the past she has a "fast heart beat." She does not know why we are surprised. She is agreeable to further evaluation of this however. She denied vaginal pain, dysuria or abdominal discomfort.  Objective:  Vital signs in last 24 hours: Vitals:   06/21/19 0501 06/21/19 0507 06/21/19 0507 06/21/19 0514  BP:   (!) 138/98   Pulse:   68   Resp: 20 19 17 17   Temp:   98.7 F (37.1 C)   TempSrc:      SpO2:   96%   Weight:      Height:       General: A but oriented only to self, in no acute distress, afebrile, nondiaphoretic HEENT: PEERL, EMO intact Cardio: Irregular, no mrg's  Pulmonary: CTA bilaterally,  no wheezing or crackles  Abdomen: Bowel sounds normal, soft, nontender  MSK: BLE nontender, nonedematous Psych: Appropriate affect, not depressed in appearance, engages well  Assessment/Plan:  Principal Problem:   Seizure ([place]) Active Problems:   Hyponatremia   AKI (acute kidney injury) ([place])   Dementia ([place])  Patient is a 78 year old female with a past medical history notable for dementia, type 2 diabetes mellitus, HTN, HLD who presented from her home with an acute altered mental status the morning of 9/4 reportedly experiencing full body convulsions while on the toilet.  She was felt to likely have had a generalized tonic-clonic seizure due to hyponatremia which is since been treated.   A/P: Acute metabolic encephalopathy: Dementia: Appears to be improving, baseline uncertain.  We will continue to advance patient's diet daily.  Her comprehension of her situation is minimal.  I feel this is less likely acute multiple chronic process.  She will need skilled nursing facility rehabilitation at discharge but I suspect this is more of a chronic process she will require advanced care for life.  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  EEG demonstrated cortical dysfunction in the right frontotemporal region likely secondary to her chronic changes but no focal epileptiform findings. - Continue Keppra p.o. 500 mg every 12 hours - Diazepam 5 mg every 12 hours as needed  Hypovolemic hypotonic hyponatremia: Stable now at 132 following discontinuation of her IV fluids.  Encourage p.o. intake. I am hopeful that this will maintain in light of cessation of a thiazide diuretic and increase p.o. intake. - Monitor BMP daily  ?New onset atrial fibrillation: Patient intermittently in RVR the prior day for which diltiazem drip was initiated. Given her admission to a prior history of what may be A-fib, we will need to further discuss this with her son. However, overnight she again became bradycardic at which time the diltiazem drip was discontinued.  I am concerned she may have sick sinus syndrome.  We have ordered an echocardiogram and will consider cardiology consultation following completion of the echocardiogram.  The patient's CHA2DS2VASC score of 6, with a 9.7% stroke risk supports anticoagulation which in the setting of a low HASBLED score supports use. She appears much more stable today. We will stop Heparin and start Eliquis.  - TTE ordered, unable to be completed due to tachycardia prior day - We will discontinue Heparin -- Eliquis 5mg  BID (Weight <60kg but age not <80 or sCr not > 1.5) - Will begin rate control with metoprolol 25mg  BID today  - Discontinue diltiazem  Diet: Heart healthy advance  as tolerated DVT prophylaxis: Full anticoagulation with heparin -Fluids: Discontinued -Code: Full Dispo: Anticipated discharge in approximately 1-2 day(s).   Kathi Ludwig, MD 06/21/2019, 8:19 AM Pager: # [phone]

## 2019-06-21 NOTE — Progress Notes (Signed)
ANTICOAGULATION CONSULT NOTE - Follow Up Consult  Pharmacy Consult for Heparin Indication: new onset atrial fibrillation  No Known Allergies  Patient Measurements: Height: 5\' 5"  (165.1 cm) Weight: 133 lb (60.3 kg) IBW/kg (Calculated) : 57 Heparin Dosing Weight: 60.8kg   Vital Signs: Temp: 98.7 F (37.1 C) (09/06 0507) Temp Source: Oral (09/05 2346) BP: 138/98 (09/06 0507) Pulse Rate: 68 (09/06 0507)  Labs: Recent Labs    06/18/19 1354  06/19/19 [id number]  06/20/19 1501 06/20/19 2140 06/21/19 0019 06/21/19 0358  HGB  --    < > 11.5*  --  13.1  --   --  13.0  HCT  --   --  34.0*  --  38.2  --   --  38.1  PLT  --   --  302  --  359  --   --  333  APTT  --   --   --   --  195*  --   --   --   LABPROT  --   --   --   --  13.9  --   --   --   INR  --   --   --   --  1.1  --   --   --   HEPARINUNFRC  --   --   --   --   --  0.47  --  0.54  CREATININE 1.68*   < > 0.95   < > 0.59  --  0.64 0.63  CKTOTAL 307*  --   --   --   --   --   --   --    < > = values in this interval not displayed.    Estimated Creatinine Clearance: 53.8 mL/min (by C-G formula based on SCr of 0.63 mg/dL).   Medical History: Past Medical History:  Diagnosis Date  . Hypercholesteremia   . Hypertension     Medications:  Scheduled:  . atenolol  50 mg Oral BID  . insulin aspart  0-9 Units Subcutaneous TID WC  . levETIRAcetam  500 mg Oral BID  . lisinopril  30 mg Oral Daily  . pravastatin  40 mg Oral q1800  . sodium chloride flush  3 mL Intravenous Q12H    Assessment: 78 yo female found to have new onset atrial fibrillation. Pharmacy consulted to dose heparin for atrial fibrillation. CHAD2-VASc score of 6. No anticoagulation prior to admission.  Heparin level 0.54 therapeutic on heparin 900 units/hr.  Hgb and plt wnl. No reported bleeding.   Goal of Therapy:  Heparin level 0.3-0.7 units/ml Monitor platelets by anticoagulation protocol: Yes   Plan:  Continue heparin 900 units/hr  Monitor  heparin level, CBC, and S/S of bleeding daily  Follow up plan for long term anticoagulation  Cristela Felt, PharmD PGY1 Pharmacy Resident [employer]: [phone]  06/21/2019,7:43 AM

## 2019-06-22 DIAGNOSIS — E860 Dehydration: Secondary | ICD-10-CM

## 2019-06-22 LAB — NOVEL CORONAVIRUS, NAA (HOSP ORDER, SEND-OUT TO REF LAB; TAT 18-24 HRS): SARS-CoV-2, NAA: NOT DETECTED

## 2019-06-22 LAB — BASIC METABOLIC PANEL
Anion gap: 11 (ref 5–15)
BUN: <5 mg/dL — ABNORMAL LOW (ref 8–23)
CO2: 26 mmol/L (ref 22–32)
Calcium: 9.3 mg/dL (ref 8.9–10.3)
Chloride: 98 mmol/L (ref 98–111)
Creatinine, Ser: 0.72 mg/dL (ref 0.44–1.00)
GFR calc Af Amer: >60 mL/min (ref >60)
GFR calc non Af Amer: >60 mL/min (ref >60)
Glucose, Bld: 104 mg/dL — ABNORMAL HIGH (ref 70–99)
Potassium: 3.9 mmol/L (ref 3.5–5.1)
Sodium: 135 mmol/L (ref 135–145)

## 2019-06-22 LAB — GLUCOSE, CAPILLARY
Glucose-Capillary: 97 mg/dL (ref 70–99)
Glucose-Capillary: 102 mg/dL — ABNORMAL HIGH (ref 70–99)

## 2019-06-22 LAB — MAGNESIUM: Magnesium: 1.8 mg/dL (ref 1.7–2.4)

## 2019-06-22 NOTE — Care Management Important Message (Signed)
Important Message  Patient Details  Name: Kendra Charles MRN: [id number] Date of Birth: Oct 12, 1941   Medicare Important Message Given:  Yes     Orbie Pyo 06/22/2019, 3:56 PM

## 2019-06-22 NOTE — Progress Notes (Addendum)
   Subjective: Pt seen at the bedside this AM on rounds. She denies chest discomfort or difficulty breathing. Patient advised that it would beneficial to followup with cardiology when she leaves the hospital. Patient is confused during physical exam repeatedly asking "is there something I can do?"  Objective:  Vital signs in last 24 hours: Vitals:   06/22/19 0600 06/22/19 0757 06/22/19 0810 06/22/19 1158  BP:   (!) 142/95 108/79  Pulse: 71   86  Resp: 17  18 20   Temp:  98.2 F (36.8 C) 98 F (36.7 C) 97.7 F (36.5 C)  TempSrc:  Axillary Axillary Axillary  SpO2: 95%  98% 91%  Weight:      Height:       Physical Exam: General: Resting comfortably in bed.  NAD CV: Irregular rhythm.  Normal S1 and S2 appreciated, no murmurs, rubs or gallops.  No lower extremity edema. PULM: Normal work of breathing, clear to auscultation bilaterally NEURO: Alert but not oriented.  Repeatedly asks if she needs to do something during physical exam.  No focal deficits appreciated.  Assessment/Plan:  Principal Problem:   Seizure ([place]) Active Problems:   Hyponatremia   AKI (acute kidney injury) ([place])   Dementia ([place])  In summary, patient is a 78 year old female with a past medical history notable for dementia, type 2 diabetes mellitus, HTN, HLD who presented from her home with AMS the morning of 9/4 reportedly experiencing full body convulsions while on the toilet. She was felt to likely have had a generalized tonic-clonic seizure due to hyponatremia which is since been treated.   #Dementia #Acute metabolic encephalopathy: At baseline, the patient has had a decline in mental status and function over the last 2 months.  She has not been seen by a physician in several years. Family reportedly found facility, [facility], where she will stay permanently after discharge.  From a medical standpoint, the patient's presentation was initially concerning for seizures, given report of full body  convulsions in the bathroom when she was found unresponsive.  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  EEG showed cortical dysfunction of the R frontotemporal region, likely secondary to underlying chronic structural disease. - Appreciate neurology recommendations:  - Continue Keppra PO 500 mg q12hr  - Diazepam 5 mg every q12hr PRN  #Hypovolemic hypotonic hyponatremia: RESOLVED.  Sodium was 135 today. Patient tolerating p.o. intake.  - Monitor BMP daily  #New Onset A-fib w/ RVR: Patient intermittently in RVR and diltiazem drip was initiated. Echocardiogram notable for aortic valve calcifications, but did not show evidence of thrombus. CHA2DS2VASC score of 6, with a 9.7% stroke risk supports anticoagulation.  - D/c diltiazem drip   - Continue Eliquis 5 mg BID  - Continue Metoprolol 50 mg daily   #FEN/GI - Diet: Heart healthy - IV fluids: none  #DVT prophylaxis: Eliquis 5 mg BID  #Code status: Full  #Dispo: Anticipated discharge in approximately tomorrow assuming SNF is available. CM & SW following.  Earlene Plater, MD Internal Medicine, PGY1 Pager: [phone]  06/22/2019,2:05 PM

## 2019-06-22 NOTE — Discharge Instructions (Signed)

## 2019-06-22 NOTE — Progress Notes (Signed)
Paged for patient having episode of low blood pressure to 63/42 after receiving metoprolol 37.5mg  and valium 5 mg. On coming up to room patient's blood pressure had risen to 94/68, sats 96% on room air, HR in the 90s with atrial fibrillation. She was rousable with mild stimulus and was able to follow commands. She states she I feels fine but does not answer orientation questions. On physical exam lung sounds are clear, no murmur but irregular rhythm, no lower extremity edema, pupils reactive. Echo recently done yesterday showed EF 60-65% with mild increased LV thickness. She has been having intermittent tachycardia and bradycardia requiring diltiazem gtt intermittently. She has not been on this since 730 am. Her lower blood pressure is likely secondary to receiving metoprolol and valium as prior bp was 123456 systolic.   - check glucose  - monitor blood pressure  - EKG now  Audrie Kuri A, DO 06/23/2019, 1:23 AM Pager: [phone]

## 2019-06-22 NOTE — Progress Notes (Signed)
MD notified that patient's HR started to fluctuate again between 120 and 150. Patient is resting in bed, no S/S of acute distress noted. Son at bedside. Will continue to monitor.

## 2019-06-22 NOTE — Progress Notes (Signed)
CSW is already following for SNF placement. CSW will continue to follow and assist with discharge planning.   Domenic Schwab, MSW, [place] Worker [facility]  [phone]

## 2019-06-22 NOTE — Progress Notes (Signed)
Paged to Mr. close room regarding tachycardia in the 150s and acute agitation.  Patient's diltiazem was titrated up to 10 mg/h. I evaluate the patient.  Patient was laying in bed and admits to anxiety but no other symptoms. Diazepam was given at 2141 hrs. patient's heart rate was in the 90s to low 100s at this time.   Marianna Payment, DO

## 2019-06-22 NOTE — Progress Notes (Signed)
MD came to assess patient and told me taht if the MAP came down to 65 to page them again.  I will continue to monitor

## 2019-06-22 NOTE — Progress Notes (Signed)
Patient's HR elevated up to 150s with patient up to [facility]. Patient had removed from LFA. Cardizem IV switched to RFA IV. HR decreased, but increased again as patient anxious and restless. Cardizem IV gtt increased to 7.5mg /h at 0047. Safety mitts applied to hands. Patient became more anxious, trying to get out of bed, agitated, wanting mitts removed, refusing to lay down. This RN and patient agreed for right safety mitt to be removed but left hand safety mitt to remain in place. Cardizem IV gtt increased to 10 mg/h at 0105. Patient complained of noticing fast HR. Patient given activity belt, and reassurance given. Marianna Payment, MD notified via telephone. This RN staying in patient room for supervision. Marianna Payment, MD in room to assess patient. Patient spoke pleasantly to MD. Patient very talkative, but slowly becoming more calm, and HR slowly decreasing. Per Marianna Payment, MD, continue to monitor. No new orders at this time. Patient resting in bed with eyes closed at this time, HR 70-95, no distress noted, no complaints.

## 2019-06-22 NOTE — Progress Notes (Signed)
Per MD staff will continue to monitor the patient. Ongoing nurse was informed to call MD if HR will be sustained high.

## 2019-06-22 NOTE — Progress Notes (Signed)
Paged Ashok Cordia MD for hypotension.

## 2019-06-22 NOTE — Discharge Summary (Addendum)
Name: Kendra Charles MRN: [id number] DOB: 05-06-1941 78 y.o. PCP: Patient, No Pcp Per  Date of Admission: 06/18/2019  5:58 AM Date of Discharge:  06/25/2019 Attending Physician: Axel Filler, *  Discharge Diagnosis: 1. Hypovolemic Hypotonic Hyponatremia 2. Seizures 3. AKI 4. New Onset A-fib w/ RVR  Discharge Medications: Allergies as of 06/23/2019   No Known Allergies      Medication List     STOP taking these medications    atenolol 100 MG tablet Commonly known as: TENORMIN   hydrochlorothiazide 25 MG tablet Commonly known as: HYDRODIURIL   sulfamethoxazole-trimethoprim 800-160 MG tablet Commonly known as: BACTRIM DS   traMADol 50 MG tablet Commonly known as: ULTRAM       TAKE these medications    apixaban 5 MG Tabs tablet Commonly known as: ELIQUIS Take 1 tablet (5 mg total) by mouth 2 (two) times daily.   diazepam 5 MG tablet Commonly known as: VALIUM Take 1 tablet (5 mg total) by mouth every 12 (twelve) hours as needed for anxiety or muscle spasms. What changed:  when to take this reasons to take this   levETIRAcetam 500 MG tablet Commonly known as: KEPPRA Take 1 tablet (500 mg total) by mouth 2 (two) times daily.   lisinopril 30 MG tablet Commonly known as: ZESTRIL Take 30 mg by mouth daily.   lovastatin 40 MG tablet Commonly known as: MEVACOR Take 40 mg by mouth daily.   metoprolol tartrate 50 MG tablet Commonly known as: LOPRESSOR Take 1 tablet (50 mg total) by mouth 2 (two) times daily.       Disposition and follow-up:   Kendra Charles was discharged from [facility] in fair condition. At the hospital follow up visit please address:  1. Hypovolemic Hypotonic Hyponatremia: When the patient presented to the hospital, she appeared hypovolemic on exam.  Sodium was 125. She was given IVF and her fluid status and sodium levels normalized prior to discharge.  2. Seizures: Pt reportedly was having full body jerks  the morning of admission. EEG which demonstrated diffuse encephalopathy and cortical dysfunction in the frontotemporal region. Discharged on Keppra 500 mg BID.  3. AKI: Cr on admission was 2.50. Downtrended to to normal limits after a couple days of IVF.  4. New Onset A-fib w/ RVR: Irregular rhythm on physical exam and was noted to be in a-fib on telemetry. F/u EKG demonstrated a-fib with RVR. The patient was intially put on a diltiazem drip and transitioned to oral metoprolol. Patient converted to normal sinus rhythm the day before discharge. Also started Eliquis.   2.  Labs / imaging needed at time of follow-up: BMP   3.  Pending labs/ test needing follow-up: none  Follow-up Appointments:  [facility] F[facility] at [facility]  [place], [place], [place] [address] Monday September 14th at 2:00 PM with Roderic Palau, NP  Hospital Course by problem list: 1. Hypovolemic Hypotonic Hyponatremia: Kendra Charles is a 78 year old female with a history of dementia, T2DM, HTN, HLD who initially presented with AMS and appeared to be hypovolemic on exam.  Her sodium level was 125. Throughout her stay, we gave her IV fluids and corrected her hyponatremia to normal limits prior to discharge.  IV fluids were discontinued and the patient showed adequate p.o. intake.   2. Seizures: The patient was initially found unresponsive and having full body jerks while on the toilet the morning of admission. Neurology performed an EEG which demonstrated diffuse encephalopathy and cortical  dysfunction in the frontotemporal region. She was put on Keppra 500 mg BID. MRI ruled out mass lesion of the brain. Source of the seizure was likely cerebral atrophy, small vessel ischemic disease, and chronic lacunar infarcts.  3. AKI: Patient presented with a Cr. 2.50 and BUN of 36 suggesting a prerenal picture. Her Cr and BUN returned to normal limits after IVF administration. Her kidney function was normal (Cr 0.97)  on  day of discharge.   4. New Onset A-fib w/ RVR and Bradycardia: The patient had irregular rhythm on physical exam during her stay and was noted to be in A-fib with RVR on telemetry and EKG. The patient required short-term diltiazem drip and was transitioned to Metoprolol 50 mg daily with adequate rate control. Her CHA2DS2VASC score of 6 (9.7% stroke risk) suggested the need for anticoagulation. The patient was put on Eliquis 5 mg twice daily.  Of note, the patient did have episodes of asymptomatic bradycardia and RVR, making a beta-blocker regimen difficult. Whenever we increased her beta-blocker the patient became hypotensive but remained asymptomatic throughout these episodes. The patient converted to normal sinus rhythm on her own the day before discharge, and remained in sinus on the day of discharge.  - Continue Metoprolol 75 mg daily - Continue Eliquis 5 mg BID - A-fib clinic follow up with Roderic Palau, NP on 9/14  Discharge Vitals:   BP 108/79 (BP Location: Right Arm)   Pulse 86   Temp 97.7 F (36.5 C) (Axillary)   Resp 20   Ht 5\' 5"  (1.651 m)   Wt 59 kg   SpO2 91%   BMI 21.63 kg/m   Pertinent Labs, Studies, and Procedures:  CBC Latest Ref Rng & Units 06/21/2019 06/20/2019 06/19/2019  WBC 4.0 - 10.5 K/uL 10.5 8.8 7.1  Hemoglobin 12.0 - 15.0 g/dL 13.0 13.1 11.5(L)  Hematocrit 36.0 - 46.0 % 38.1 38.2 34.0(L)  Platelets 150 - 400 K/uL 333 359 302   BMP Latest Ref Rng & Units 06/23/2019 06/22/2019 06/21/2019  Glucose 70 - 99 mg/dL 91 104(H) 101(H)  BUN 8 - 23 mg/dL 9 <5(L) 5(L)  Creatinine 0.44 - 1.00 mg/dL 0.84 0.72 0.63  Sodium 135 - 145 mmol/L 133(L) 135 134(L)  Potassium 3.5 - 5.1 mmol/L 3.7 3.9 3.5  Chloride 98 - 111 mmol/L 100 98 99  CO2 22 - 32 mmol/L 23 26 22   Calcium 8.9 - 10.3 mg/dL 9.1 9.3 9.1   CT head: IMPRESSION: 1. 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 2. Advanced atrophy and white matter disease. 3. Remote lacunar infarcts of the left basal  ganglia. 4. Age indeterminate right basal ganglia infarct. 5. No acute hemorrhage. 6. Atherosclerosis.  MRI Brain: IMPRESSION: Significantly motion degraded examination.   No evidence of acute intracranial abnormality.   Chronic right parietal lobe cortical infarct.   Advanced generalized parenchymal atrophy and chronic small vessel ischemic disease. Multiple chronic supratentorial and infratentorial lacunar infarcts as described.  Echocardiogram: IMPRESSIONS    1. The left ventricle has normal systolic function with an ejection fraction of 60-65%. The cavity size was normal. There is mildly increased left ventricular wall thickness. Left ventricular diastolic parameters were normal.  2. The right ventricle has normal systolic function. The cavity was normal. There is no increase in right ventricular wall thickness.  3. Left atrial size was moderately dilated.  4. The aortic valve is tricuspid. Moderate thickening of the aortic valve. Moderate calcification of the aortic valve. Aortic valve regurgitation is mild  by color flow Doppler. No stenosis of the aortic valve.  5. The aorta is normal unless otherwise noted.  Discharge Instructions:    Signed: Earlene Plater, MD Internal Medicine, PGY1 Pager: [phone]  06/23/2019,11:49 AM

## 2019-06-22 NOTE — Progress Notes (Signed)
Occupational Therapy Treatment Patient Details Name: Kendra Charles MRN: [id number] DOB: 1941/01/28 Today's Date: 06/22/2019    History of present illness Pt is a 78 yo female admitted with possible sz activity and new AFIB with RVR. CT: remote right parietal lobe infarct with chronic volume loss, advanced atrophy and white matter disease, remote lacunar infarcts of the left basal ganglia, age indeterminate right basal ganglia infarct.PHMx: HTN, dementia   OT comments  This 78 yo female admitted with above presents to acute OT with able to do grooming tasks at EOB with setup/S but limited beyond that today due to intermittent increase in HR (as high as 150's and low as 70's) with some of the high times just sitting EOB not doing anything and some of low numbers while grooming. She will continue to benefit from acute OT with follow up at SNF.  Follow Up Recommendations  SNF;Supervision/Assistance - 24 hour    Equipment Recommendations  Other (comment)(TBD at next venue)       Precautions / Restrictions Precautions Precautions: Fall Precaution Comments: monitor HR (up to 150s with dropping to low as 70s) Restrictions Weight Bearing Restrictions: No       Mobility Bed Mobility Overal bed mobility: Needs Assistance Bed Mobility: Supine to Sit;Sit to Supine     Supine to sit: Min assist Sit to supine: Min guard         Balance Overall balance assessment: Needs assistance;No apparent balance deficits (not formally assessed) Sitting-balance support: No upper extremity supported;Feet supported Sitting balance-Leahy Scale: Good Sitting balance - Comments: pt sitting EOB and able to comb hair thoroughly and wash face without LOB                                   ADL either performed or assessed with clinical judgement   ADL Overall ADL's : Needs assistance/impaired     Grooming: Wash/dry face;Brushing hair;Set up;Supervision/safety;Sitting Grooming Details  (indicate cue type and reason): EOB                                     Vision Patient Visual Report: No change from baseline            Cognition Arousal/Alertness: Awake/alert Behavior During Therapy: Flat affect Overall Cognitive Status: History of cognitive impairments - at baseline                                                     Pertinent Vitals/ Pain       Pain Assessment: No/denies pain         Frequency  Min 2X/week        Progress Toward Goals  OT Goals(current goals can now be found in the care plan section)  Progress towards OT goals: Not progressing toward goals - comment(due to increased HR this session limiting what I had her do)     Plan Discharge plan remains appropriate       AM-PAC OT "6 Clicks" Daily Activity     Outcome Measure   Help from another person eating meals?: A Little Help from another person taking care of personal grooming?: A Little Help from another person toileting, which includes using  toliet, bedpan, or urinal?: A Lot Help from another person bathing (including washing, rinsing, drying)?: A Lot Help from another person to put on and taking off regular upper body clothing?: A Little Help from another person to put on and taking off regular lower body clothing?: A Lot 6 Click Score: 15    End of Session    OT Visit Diagnosis: Other abnormalities of gait and mobility (R26.89);Other symptoms and signs involving cognitive function   Activity Tolerance (limited by increased HR (off and on while seated EOB))   Patient Left  in bed with bed alarm on and call bell and phone next to her on left   Nurse Communication (HR)        Time: [id number] OT Time Calculation (min): 22 min  Charges: OT General Charges $OT Visit: 1 Visit OT Treatments $Self Care/Home Management : 8-22 mins  [place], OTR/L Acute [employer] Pager [phone] Office [phone]      Almon Register 06/22/2019, 4:37 PM

## 2019-06-22 NOTE — Progress Notes (Signed)
MEWS/VS Documentation      06/22/2019 1602 06/22/2019 1603 06/22/2019 1604 06/22/2019 1838   MEWS Score:  1  3  0  3   MEWS Score Color:  Green  Yellow  Green  Yellow   Resp:  -  15  20  18    Pulse:  -  -  -  93   BP:  -  -  109/84  120/65   Temp:  98.3 F (36.8 C)  -  98.3 F (36.8 C)  -   O2 Device:  -  -  Room Air  -   Level of Consciousness:  -  -  Alert  -    InItiated MEWS protocol. New A Fib. MD aware of her elevated HR.

## 2019-06-23 LAB — BASIC METABOLIC PANEL
Anion gap: 10 (ref 5–15)
BUN: 9 mg/dL (ref 8–23)
CO2: 23 mmol/L (ref 22–32)
Calcium: 9.1 mg/dL (ref 8.9–10.3)
Chloride: 100 mmol/L (ref 98–111)
Creatinine, Ser: 0.84 mg/dL (ref 0.44–1.00)
GFR calc Af Amer: >60 mL/min (ref >60)
GFR calc non Af Amer: >60 mL/min (ref >60)
Glucose, Bld: 91 mg/dL (ref 70–99)
Potassium: 3.7 mmol/L (ref 3.5–5.1)
Sodium: 133 mmol/L — ABNORMAL LOW (ref 135–145)

## 2019-06-23 LAB — MAGNESIUM: Magnesium: 1.7 mg/dL (ref 1.7–2.4)

## 2019-06-23 MED ORDER — DIAZEPAM 5 MG PO TABS: 5.0000 mg | ORAL_TABLET | Freq: Two times a day (BID) | ORAL | 0 refills | Status: DC | PRN

## 2019-06-23 MED ORDER — APIXABAN 5 MG PO TABS: 5.0000 mg | ORAL_TABLET | Freq: Two times a day (BID) | ORAL | Status: DC

## 2019-06-23 MED ORDER — METOPROLOL TARTRATE 50 MG PO TABS: 50.0000 mg | ORAL_TABLET | Freq: Two times a day (BID) | ORAL | Status: DC

## 2019-06-23 MED ORDER — LEVETIRACETAM 100 MG/ML PO SOLN
500.0000 mg | Freq: Two times a day (BID) | ORAL | Status: DC
  Administered 2019-06-23 – 2019-06-25 (×4): 500 mg via ORAL
  Filled 2019-06-23 (×4): qty 5

## 2019-06-23 MED ORDER — DILTIAZEM HCL-DEXTROSE 100-5 MG/100ML-% IV SOLN (PREMIX)
5.0000 mg/h | INTRAVENOUS | Status: DC
  Administered 2019-06-23: 19:00:00 5 mg/h via INTRAVENOUS
  Administered 2019-06-24: 03:00:00 6 mg/h via INTRAVENOUS
  Filled 2019-06-23 (×3): qty 100

## 2019-06-23 MED ORDER — LEVETIRACETAM 500 MG PO TABS: 500.0000 mg | ORAL_TABLET | Freq: Two times a day (BID) | ORAL | Status: DC

## 2019-06-23 NOTE — Plan of Care (Signed)
  Problem: Clinical Measurements: Goal: Will remain free from infection Outcome: Progressing Goal: Cardiovascular complication will be avoided Outcome: Progressing   Problem: Coping: Goal: Level of anxiety will decrease Outcome: Progressing   Problem: Education: Goal: Knowledge of disease or condition will improve Outcome: Progressing   Problem: Activity: Goal: Ability to tolerate increased activity will improve Outcome: Progressing

## 2019-06-23 NOTE — Progress Notes (Signed)
Patient waiting for transportation via EMS to SNF.  Blood pressure and heart rate both high.  MD messaged.  Waiting for response

## 2019-06-23 NOTE — Significant Event (Signed)
Rapid Response Event Note  Overview: Hypertension, Tachycardia    1610 Called by RN, patient BP 165/136 and tachycardic HR >150.  Patient was for DC to SNF this afternoon.     Initial Focused Assessment: 1613 On arrival, patient in bed, states 'she just needs to calm down.'  No other complaints per patient.   Patient in AF with RVR.  BP 173/93.  MD paged by RN.    Interventions: - New 22G PIV placed in right hand, NS started at 27ml/hr, awaiting diltiazem to start.    Plan of Care (if not transferred): -- Start Diltiazem gtt per orders   Event Summary: End call: [id number]  Call RR RN as needed  Truda Staub, Lattie Haw

## 2019-06-23 NOTE — TOC Transition Note (Signed)
Transition of Care [facility]) - CM/SW Discharge Note   Patient Details  Name: Kendra Charles MRN: [id number] Date of Birth: 12/11/1940  Transition of Care [facility]) CM/SW Contact:  Benard Halsted, LCSW Phone Number: 06/23/2019, 2:18 PM   Clinical Narrative:    Patient will DC to: [facility] Anticipated DC date: 06/23/19 Family notified: Son and IT trainer by: Rodell Perna   Per MD patient ready for DC to [facility]. RN, patient, patient's family, and facility notified of DC. Discharge Summary and FL2 sent to facility. RN to call report prior to discharge [phone]). DC packet on chart. Ambulance transport requested for patient.   CSW will sign off for now as social work intervention is no longer needed. Please consult [place] again if new needs arise.  Cedric Fishman, LCSW Clinical Social Worker [phone]    Final next level of care: Skilled Nursing Facility Barriers to Discharge: No Barriers Identified   Patient Goals and CMS Choice Patient states their goals for this hospitalization and ongoing recovery are:: family states that their goal is to find assisted living placement for pt in a location convienent for their family      Discharge Placement   Existing PASRR number confirmed : 06/23/19          Patient chooses bed at: [facility] Patient to be transferred to facility by: [place] Name of family member notified: Janann Colonel and patient's son Patient and family notified of of transfer: 06/23/19  Discharge Plan and Services In-house Referral: Clinical Social Work              DME Arranged: N/A DME Agency: NA       HH Arranged: NA HH Agency: NA        Social Determinants of Health ([place]) Interventions     Readmission Risk Interventions Readmission Risk Prevention Plan 06/19/2019  Transportation Screening Complete  PCP or Specialist Appt within 5-7 Days Complete  Home Care Screening Complete  Medication Review (RN CM) Complete

## 2019-06-23 NOTE — Progress Notes (Signed)
MD at bedside.  Cancel discharge at this time

## 2019-06-23 NOTE — Progress Notes (Signed)
    Vital Signs MEWS/VS Documentation      06/23/2019 0800 06/23/2019 1200 06/23/2019 1226 06/23/2019 1505   MEWS Score:  0  0  0  3   MEWS Score Color:  Green  Green  Green  Yellow   Pulse:  80  -  88  -   BP:  104/76  -  -  -   Temp:  98.2 F (36.8 C)  -  98.7 F (37.1 C)  -   O2 Device:  Room Air  -  Room Air  -   Level of Consciousness:  Alert  Alert  -  -      No acute change in patients condition at this time.  Will continue to monitor     Kendra Charles  Kendra Charles 06/23/2019,5:00 PM

## 2019-06-23 NOTE — Progress Notes (Signed)
PT Cancellation Note  Patient Details Name: Kendra Charles MRN: [id number] DOB: Nov 05, 1940   Cancelled Treatment:    Reason Eval/Treat Not Completed: Medical issues which prohibited therapy HR continues to fluctuate in A-fib from 98-133BPM at rest. Also note HR/BP related medical events the previous evening. Plan to hold skilled PT services until HR stabilizes and patient is medically appropriate.    Deniece Ree PT, DPT, CBIS  Supplemental Physical Therapist [facility]    Pager [phone] Acute Rehab Office [phone]

## 2019-06-23 NOTE — Progress Notes (Signed)
On call provider notified of increased agitation and confusion since 1900. Scheduled dose of benzo q 12 hrs. Last dose given at 1529. Staff nurse wants to given early second to patient condition.

## 2019-06-23 NOTE — Progress Notes (Signed)
   Vital Signs MEWS/VS Documentation      06/23/2019 0800 06/23/2019 1200 06/23/2019 1226 06/23/2019 1505   MEWS Score:  0  0  0  3   MEWS Score Color:  Green  Green  Green  Yellow   Pulse:  80  -  88  -   BP:  104/76  -  -  -   Temp:  98.2 F (36.8 C)  -  98.7 F (37.1 C)  -   O2 Device:  Room Air  -  Room Air  -   Level of Consciousness:  Alert  Alert  -  -      No acute change in patient's condition at this time.  Will continue to monitor     [employer] 06/23/2019,4:59 PM

## 2019-06-23 NOTE — Progress Notes (Signed)
Notified that patient was in a-fib w/ RVR at a rate of 175 and symptomatic. At bedside the patient appears stable and in no acute distress, however, her HR is >160, in a-fib per tele and she feels weak/unwell.   Caution aggressive uptitration as the patient becomes bradycardic with minimal rate control.   We will start Diltiazem GTT and up-titrate per parameters  Hold discharge [place] MD team for hypotension or persistent tachycardia after uptitrating

## 2019-06-23 NOTE — TOC Progression Note (Signed)
Transition of Care [facility]) - Progression Note    Patient Details  Name: Kendra Charles MRN: [id number] Date of Birth: 01/10/1941  Transition of Care [facility]) CM/SW [place], LCSW Phone Number: 06/23/2019, 10:48 AM  Clinical Narrative:    [facility] able to accept patient today. Son/[name] going to complete admission paperwork today at 2pm.   Expected Discharge Plan: [place] Barriers to Discharge: Continued Medical Work up  Expected Discharge Plan and Services Expected Discharge Plan: [place] In-house Referral: Clinical Social Work     Living arrangements for the past 2 months: Single Family Home(Pt has been residing with her sister, and then with daughter, and is now placed with her son, [name].)                 DME Arranged: N/A DME Agency: NA       HH Arranged: NA [place] Agency: NA         Social Determinants of Health ([place]) Interventions    Readmission Risk Interventions Readmission Risk Prevention Plan 06/19/2019  Transportation Screening Complete  PCP or Specialist Appt within 5-7 Days Complete  Home Care Screening Complete  Medication Review (RN CM) Complete

## 2019-06-23 NOTE — Progress Notes (Signed)
Orders in for cardizem drip.

## 2019-06-23 NOTE — Progress Notes (Addendum)
   Subjective: Pt seen at the bedside on rounds this AM.  Patient does not have any complaints today.  Appears comfortable during her visit.  Objective:  Vital signs in last 24 hours: Vitals:   06/23/19 0012 06/23/19 0226 06/23/19 0411 06/23/19 0525  BP: 96/72 100/65 116/88   Pulse: (!) 58 79 86   Resp: 12 12 16    Temp: 98.2 F (36.8 C) 98.3 F (36.8 C) 98 F (36.7 C)   TempSrc: Oral Oral    SpO2: 95% 97% 95%   Weight:    54.9 kg  Height:       Physical Exam: General: NAD, comfortably resting in bed HEENT: NCAT CV: RRR, normal S1 and S2 no murmurs rubs or gallops appreciated PULM: Clear to auscultation bilaterally NEURO: Alert but disoriented, no focal deficits appreciated Assessment/Plan:  Principal Problem:   Seizure ([place]) Active Problems:   Hyponatremia   AKI (acute kidney injury) ([place])   Dementia ([place])  In summary, patient is a 78 year old female with a past medical history notable for dementia, type 2 diabetes mellitus, HTN, HLD who presented from her home with AMS the morning of 9/4 reportedly experiencing full body convulsions while on the toilet. She was felt to likely have had a generalized tonic-clonic seizure due to hyponatremia which is since been treated.  #Dementia #Acute metabolic encephalopathy: At baseline, the patient has had a decline in mental status and function over the last 2 months. She has not been seen by a physician in several years. Family reportedly found facility, [facility], where she will stay permanently after discharge.  From a medical standpoint, the patient's presentation was initially concerning for seizures, given report of full body convulsions in the bathroom when she was found unresponsive.  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 EEG showed cortical dysfunction of the R frontotemporal region, likely secondary to underlying chronic structural disease. - Appreciate neurology  recommendations:             -Continue Keppra PO 500 mg q12hr             -Diazepam 5 mg every q12hr PRN  #Hypovolemic hypotonic hyponatremia: RESOLVED.  Sodium was 135 today. Patient tolerating p.o. intake.  -Monitor BMP daily  #New Onset A-fib w/ RVR: Patient intermittentlyinRVR and diltiazem drip was initiated.Echocardiogram notable for aortic valve calcifications, but did not show evidence of thrombus. CHA2DS2VASC score of6, with a 9.7% stroke risk supports anticoagulation. Patient did have a transient episode of hypotension (0000000 systolic) and bradycardia (58) overnight which resolved on its own. EKG showed no signs of ischemia Patient was asymptomatic during this episode. - Eliquis 5 mg BID  - Metoprolol 50 mg daily  - Schedule f/u in the a-fib clinic   #FEN/GI - Diet: Heart healthy - IV fluids: none  #DVT prophylaxis: Eliquis 5 mg BID  #Code status: Full  #Dispo: Anticipated discharge today. Will call family & case management to confirm placement.  Earlene Plater, MD Internal Medicine, PGY1 Pager: [phone]  06/23/2019,11:31 AM

## 2019-06-23 NOTE — Progress Notes (Signed)
Rapid response RN contacted

## 2019-06-23 NOTE — Plan of Care (Signed)
  Problem: Education: Goal: Knowledge of General Education information will improve Description: Including pain rating scale, medication(s)/side effects and non-pharmacologic comfort measures Outcome: Adequate for Discharge   Problem: Health Behavior/Discharge Planning: Goal: Ability to manage health-related needs will improve Outcome: Adequate for Discharge   Problem: Clinical Measurements: Goal: Ability to maintain clinical measurements within normal limits will improve Outcome: Adequate for Discharge Goal: Will remain free from infection Outcome: Adequate for Discharge Goal: Diagnostic test results will improve Outcome: Adequate for Discharge Goal: Respiratory complications will improve Outcome: Adequate for Discharge Goal: Cardiovascular complication will be avoided Outcome: Adequate for Discharge   Problem: Activity: Goal: Risk for activity intolerance will decrease Outcome: Adequate for Discharge   Problem: Nutrition: Goal: Adequate nutrition will be maintained Outcome: Adequate for Discharge   Problem: Coping: Goal: Level of anxiety will decrease Outcome: Adequate for Discharge   Problem: Education: Goal: Knowledge of disease or condition will improve Outcome: Adequate for Discharge Goal: Understanding of medication regimen will improve Outcome: Adequate for Discharge   Problem: Activity: Goal: Ability to tolerate increased activity will improve Outcome: Adequate for Discharge   Problem: Cardiac: Goal: Ability to achieve and maintain adequate cardiopulmonary perfusion will improve Outcome: Adequate for Discharge   Problem: Health Behavior/Discharge Planning: Goal: Ability to safely manage health-related needs after discharge will improve Outcome: Adequate for Discharge

## 2019-06-23 NOTE — Progress Notes (Signed)
Report called at this time to [facility]

## 2019-06-24 ENCOUNTER — Inpatient Hospital Stay (HOSPITAL_COMMUNITY): Payer: Medicare Other

## 2019-06-24 DIAGNOSIS — D72829 Elevated white blood cell count, unspecified: Secondary | ICD-10-CM

## 2019-06-24 DIAGNOSIS — D473 Essential (hemorrhagic) thrombocythemia: Secondary | ICD-10-CM

## 2019-06-24 DIAGNOSIS — R079 Chest pain, unspecified: Secondary | ICD-10-CM

## 2019-06-24 LAB — BASIC METABOLIC PANEL
Anion gap: 13 (ref 5–15)
BUN: 9 mg/dL (ref 8–23)
CO2: 25 mmol/L (ref 22–32)
Calcium: 9.5 mg/dL (ref 8.9–10.3)
Chloride: 97 mmol/L — ABNORMAL LOW (ref 98–111)
Creatinine, Ser: 0.76 mg/dL (ref 0.44–1.00)
GFR calc Af Amer: >60 mL/min (ref >60)
GFR calc non Af Amer: >60 mL/min (ref >60)
Glucose, Bld: 133 mg/dL — ABNORMAL HIGH (ref 70–99)
Potassium: 4.1 mmol/L (ref 3.5–5.1)
Sodium: 135 mmol/L (ref 135–145)

## 2019-06-24 LAB — CBC
HCT: 40.2 % (ref 36.0–46.0)
Hemoglobin: 13.5 g/dL (ref 12.0–15.0)
MCH: 29.9 pg (ref 26.0–34.0)
MCHC: 33.6 g/dL (ref 30.0–36.0)
MCV: 89.1 fL (ref 80.0–100.0)
Platelets: 461 10*3/uL — ABNORMAL HIGH (ref 150–400)
RBC: 4.51 MIL/uL (ref 3.87–5.11)
RDW: 13.9 % (ref 11.5–15.5)
WBC: 15.9 10*3/uL — ABNORMAL HIGH (ref 4.0–10.5)
nRBC: 0 % (ref 0.0–0.2)

## 2019-06-24 LAB — TSH: TSH: 1.515 u[IU]/mL (ref 0.350–4.500)

## 2019-06-24 MED ORDER — METOPROLOL TARTRATE 50 MG PO TABS
75.0000 mg | ORAL_TABLET | Freq: Two times a day (BID) | ORAL | Status: DC
  Administered 2019-06-24 – 2019-06-25 (×2): 75 mg via ORAL
  Filled 2019-06-24 (×2): qty 1

## 2019-06-24 MED ORDER — METOPROLOL TARTRATE 25 MG PO TABS
25.0000 mg | ORAL_TABLET | Freq: Once | ORAL | Status: AC
  Administered 2019-06-24: 14:00:00 25 mg via ORAL
  Filled 2019-06-24: qty 1

## 2019-06-24 MED ORDER — SODIUM CHLORIDE 0.9 % IV SOLN
INTRAVENOUS | Status: DC
  Administered 2019-06-24: 23:00:00 via INTRAVENOUS

## 2019-06-24 NOTE — Progress Notes (Signed)
On call provider makes rounds on patients. Update on patient condition given. Staff nurse expresses concern r/t increased agitation. Reminded physician of benzo administration last at 1529 on day shift. MEWS score is increased second to elevated heart rate and blood pressure.

## 2019-06-24 NOTE — TOC Progression Note (Signed)
Transition of Care [facility]) - Progression Note    Patient Details  Name: Kendra Charles MRN: [id number] Date of Birth: 10-19-1940  Transition of Care [facility]) CM/SW [place], LCSW Phone Number: 06/24/2019, 1:43 PM  Clinical Narrative:    CSW spoke with patient's daughter in law, [place]. She requested to know why no one had contacted her or patient's son about not discharging. CSW alerted Nursing Director and will follow up once patient is medically stable for discharge.    Expected Discharge Plan: Skilled Nursing Facility Barriers to Discharge: No Barriers Identified  Expected Discharge Plan and Services Expected Discharge Plan: [place] In-house Referral: Clinical Social Work     Living arrangements for the past 2 months: Single Family Home(Pt has been residing with her sister, and then with daughter, and is now placed with her son, [name].) Expected Discharge Date: 06/23/19               DME Arranged: N/A DME Agency: NA       HH Arranged: NA [place] Agency: NA         Social Determinants of Health (SDOH) Interventions    Readmission Risk Interventions Readmission Risk Prevention Plan 06/19/2019  Transportation Screening Complete  PCP or Specialist Appt within 5-7 Days Complete  Home Care Screening Complete  Medication Review (RN CM) Complete

## 2019-06-24 NOTE — Progress Notes (Signed)
No patient teaching completed second to patient altered mental status

## 2019-06-24 NOTE — Progress Notes (Signed)
Patient seen and assessed. Physical assessment completed via computerized charting per [facility] policy.Unable to complete patient education on patient second to all problems being resolved (patient slated for discharge today-discharge held second to atrial fib with RVR). No family present at bedside. Side rails up x 3. Bed in lowest position and locked. Call light in reach.

## 2019-06-24 NOTE — Progress Notes (Addendum)
   Subjective: Patient seen at the bedside on rounds this morning. She says she is not doing great. She reports bilateral chest pain. Per staff, patient is able to eat and drink herself with encouragement.   Objective:  Vital signs in last 24 hours: Vitals:   06/24/19 0438 06/24/19 0440 06/24/19 0450 06/24/19 0605  BP: (!) 158/99   (!) 122/96  Pulse: 86 88 84   Resp: 17 17 18    Temp:    99 F (37.2 C)  TempSrc:    Oral  SpO2: 95% 97% 96%   Weight:      Height:       Physical Exam: General: Comfortable appearing, NAD HEENT: NCAT CV: RRR, normal S1 and S2 appreciated.  No murmurs rubs or gallops appreciated PULM: Productive coughing during exam. Clear to auscultation bilaterally NEURO: Alert but not oriented.  No focal deficits  Assessment/Plan:  Principal Problem:   Seizure ([place]) Active Problems:   Hyponatremia   AKI (acute kidney injury) ([place])   Dementia ([place])  In summary, patient is a 78 year old female with a past medical history notable for dementia, type 2 diabetes mellitus, HTN, HLD who presented from her home with AMS the morning of 9/4 reportedly experiencing full body convulsions while on the toilet. She was felt to likely have had a generalized tonic-clonic seizure due to hyponatremia which is since been treated.    #Dementia #Acute metabolic encephalopathy: At baseline, the patient has had a decline in mental status and function over the last 2 months. She has not been seen by a physician in several years. Family reportedly found facility that she will stay permanently after discharge. 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 EEG showed cortical dysfunction of the R frontotemporal region, likely secondary to underlying chronic structural disease. - Appreciate neurology recommendations:              Continue Keppra PO 500 mg q12hr              Diazepam 5 mg every q12hr PRN  #New Onset A-fib w/ RVR: Patient  intermittently in RVR and diltiazem drip was initiated. Echocardiogram notable for aortic valve calcifications, but did not show evidence of thrombus. CHA2DS2VASC score of 6, with a 9.7% stroke risk supports anticoagulation.   - Increase Metoprolol to 75 mg daily  - Eliquis 5 mg BID  - Scheduled f/u in the a-fib clinic (appointment scheduled on 9/14)  #Thrombocytosis #Leukocytosis: Pt's WBC and platelet count increased to 14.9 and 461 today after being in normal limits. No fevers noted in the past 24 hrs. Pt has decrease urine output over the last few days.  It is possible this could be a sign of brewing infection. - Obtain bladder scan  - Obtain CXR  #Chest pain: Likely MSK related, doesn't appear to be ischemic dz at this time. - EKG   #FEN/GI - Diet: Heart healthy - IV fluids: none  #DVT prophylaxis:  - Eliquis 5 mg BID  #Code status: Full  #Dispo: Anticipated pending medical course.   Earlene Plater, MD Internal Medicine, PGY1 Pager: [phone]  06/24/2019,2:47 PM

## 2019-06-24 NOTE — Progress Notes (Signed)
Paged to the patient's room for acute agitation and confusion.  Patient's heart rate was in the 160s and blood pressure 0000000 systolic. I personally evaluate the patient. She appeared anxious but denied chest pain, shortness of breath, palpitations, or any other symptoms. Heart sounds -  no murmurs, rubs or gallops. Lungs - clear to ascultation bilaterally Spoke with Candace, Ms. Billy's nurse, regarding the patient's medication management and decided to administer her evening dose of metoprolol and reevaluate if she needs her benzodiazepine medication earlier then scheduled. Patient's most recent heart rate is 110 and blood pressure is [id number] systolic.   Marianna Payment, D.O. PGY-1

## 2019-06-24 NOTE — Progress Notes (Signed)
Physical Therapy Treatment Patient Details Name: Kendra Charles MRN: [id number] DOB: [birth year] Today's Date: 06/24/2019    History of Present Illness Pt is a 78 yo female admitted with possible sz activity and new AFIB with RVR. 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  Today pt confused and resting HR 74 and after walking 25 feet 92bpm.  Pts O2 sats 94%.  Pts BP 128/92.  Pt walked to bathroom with RW needing mod to min assist.  Pt inconsistent following directions and restless.  Pt appeared to be dragging her right leg.  I assessed her after walking - I anticipated right leg to be weak but instead her left ankle was tight and hard to get DF to neutral.  PT will focus on stretching out pts ankles prior to walking to help with longer strides.  Pt needs 24/7 care for her safety.  Agree with SNF level care.   Follow Up Recommendations  SNF;Supervision/Assistance - 24 hour     Equipment Recommendations  None recommended by PT    Recommendations for Other Services       Precautions / Restrictions Precautions Precautions: Fall Precaution Comments: monitor vitals Restrictions Weight Bearing Restrictions: No    Mobility  Bed Mobility Overal bed mobility: Needs Assistance Bed Mobility: Supine to Sit     Supine to sit: Min assist     General bed mobility comments: pt trying to get OOB when i walked in.  she was sitting EOB with poor positioning.  while i was getting room ready to get her up -she took off her socks and gown and got back in the bed. she didnt under stand my verbal directions.  Transfers Overall transfer level: Needs assistance Equipment used: Rolling walker (2 wheeled)   Sit to Stand: Min assist;Mod assist          General transfer comment: pt needed min assist to stnad from  high bed and mod assist to stand from lower toilet.  Pt stands and stops in flexed posture - unsafe - and i had to cue her to get more erect before startign to walk as she was at high level of falls in this position but didnt realize it.  Ambulation/Gait Ambulation/Gait assistance: Min assist;Mod assist Gait Distance (Feet): 25 Feet Assistive device: Rolling walker (2 wheeled) Gait Pattern/deviations: Step-to pattern;Shuffle;Decreased stride length;Trunk flexed;Drifts right/left     General Gait Details: Pt walked to bathroom with RW and mod to min assist.  Pt wtih flexed posture and short shuffling steps.  pt kept saying she was going to fall.  pt walked to bathroom.  she needed assist maneuvering the RW and staying in the walker to get into the bathroom.  pt urinated and then got up with mod assist and then reprots she couldnt stand and sat back down and said she had to have BM.  pt urinated more and then up and I brought chiar closer for her - 10 feet away.  pt appeared to be walking with step to gait pattern with left leg leading   Stairs             Wheelchair Mobility    Modified Rankin (Stroke Patients Only)       Balance Overall balance assessment: Needs  assistance     Sitting balance - Comments: pt sitting EOB with flexed sacral sitting posture       Standing balance comment: pt with flexed posture - needed walker to lean on for support                            Cognition Arousal/Alertness: Awake/alert Behavior During Therapy: Anxious;Impulsive;Restless Overall Cognitive Status: History of cognitive impairments - at baseline                                 General Comments: Pt with significant cognitive deficits.  Would require 24/7 assist at this level.  Nursing said she is more confused than her normal. she inconsistently followed one step commands for me (50% of the  time)      Exercises Total Joint Exercises Ankle Circles/Pumps: AAROM;10 reps;Seated;Both Long Arc Quad: AAROM;10 reps;Both;Seated    General Comments General comments (skin integrity, edema, etc.): pt very confused with slurred speech.  I could only understand about 30% of what she said.  pt following 50% of my one step verbal and tactile commands      Pertinent Vitals/Pain Pain Assessment: No/denies pain    Home Living                      Prior Function            PT Goals (current goals can now be found in the care plan section) Progress towards PT goals: Progressing toward goals    Frequency    Min 2X/week      PT Plan Current plan remains appropriate    Co-evaluation              AM-PAC PT "6 Clicks" Mobility   Outcome Measure  Help needed turning from your back to your side while in a flat bed without using bedrails?: A Little Help needed moving from lying on your back to sitting on the side of a flat bed without using bedrails?: A Little Help needed moving to and from a bed to a chair (including a wheelchair)?: A Little Help needed standing up from a chair using your arms (e.g., wheelchair or bedside chair)?: A Little Help needed to walk in hospital room?: A Lot Help needed climbing 3-5 steps with a railing? : A Lot 6 Click Score: 16    End of Session Equipment Utilized During Treatment: Gait belt Activity Tolerance: Patient limited by fatigue Patient left: in chair;with chair alarm set;with call bell/phone within reach Nurse Communication: Mobility status;Precautions PT Visit Diagnosis: Muscle weakness (generalized) (M62.81);Difficulty in walking, not elsewhere classified (R26.2);Other abnormalities of gait and mobility (R26.89)     Time: [id number] PT Time Calculation (min) (ACUTE ONLY): 30 min  Charges:  $Gait Training: 23-37 mins                     06/24/2019   Rande Lawman, PT    Loyal Buba 06/24/2019,  3:59 PM

## 2019-06-25 DIAGNOSIS — Z7901 Long term (current) use of anticoagulants: Secondary | ICD-10-CM

## 2019-06-25 LAB — CBC
HCT: 34.1 % — ABNORMAL LOW (ref 36.0–46.0)
Hemoglobin: 11.5 g/dL — ABNORMAL LOW (ref 12.0–15.0)
MCH: 29.9 pg (ref 26.0–34.0)
MCHC: 33.7 g/dL (ref 30.0–36.0)
MCV: 88.6 fL (ref 80.0–100.0)
Platelets: 382 10*3/uL (ref 150–400)
RBC: 3.85 MIL/uL — ABNORMAL LOW (ref 3.87–5.11)
RDW: 13.9 % (ref 11.5–15.5)
WBC: 11.3 10*3/uL — ABNORMAL HIGH (ref 4.0–10.5)
nRBC: 0 % (ref 0.0–0.2)

## 2019-06-25 LAB — BASIC METABOLIC PANEL
Anion gap: 11 (ref 5–15)
BUN: 11 mg/dL (ref 8–23)
CO2: 23 mmol/L (ref 22–32)
Calcium: 9 mg/dL (ref 8.9–10.3)
Chloride: 99 mmol/L (ref 98–111)
Creatinine, Ser: 0.97 mg/dL (ref 0.44–1.00)
GFR calc Af Amer: >60 mL/min (ref >60)
GFR calc non Af Amer: 57 mL/min — ABNORMAL LOW (ref >60)
Glucose, Bld: 88 mg/dL (ref 70–99)
Potassium: 3.5 mmol/L (ref 3.5–5.1)
Sodium: 133 mmol/L — ABNORMAL LOW (ref 135–145)

## 2019-06-25 MED ORDER — DIAZEPAM 5 MG PO TABS: 5.0000 mg | ORAL_TABLET | Freq: Two times a day (BID) | ORAL | 0 refills | Status: DC | PRN

## 2019-06-25 MED ORDER — METOPROLOL TARTRATE 75 MG PO TABS: 75.0000 mg | ORAL_TABLET | Freq: Two times a day (BID) | ORAL | Status: DC

## 2019-06-25 MED ORDER — LEVETIRACETAM 500 MG PO TABS: 500.0000 mg | ORAL_TABLET | Freq: Two times a day (BID) | ORAL | Status: DC

## 2019-06-25 NOTE — Progress Notes (Signed)
Right hand piv removed.  Report given to nurse Quillian Quince. AVS summary reviewed and printed. Discharged via transport on stretcher.

## 2019-06-25 NOTE — Progress Notes (Signed)
   Subjective: Patient seen at bedside this AM. Patient states she is doing ok. Patient states she does not feel well. States "everything is painful."  Patient appears comfortable while she is resting in bed.  Objective:  Vital signs in last 24 hours: Vitals:   06/25/19 0101 06/25/19 0452 06/25/19 0749 06/25/19 0910  BP:   109/63 136/72  Pulse:   (!) 57 67  Resp:   18   Temp: 97.7 F (36.5 C) 98.4 F (36.9 C) 98.3 F (36.8 C)   TempSrc:   Axillary   SpO2:   91%   Weight:  58.1 kg    Height:       Physical exam: General: Resting in bed, NAD HEENT: NCAT CV: RRR, normal S1-S2 appreciated no murmurs, rubs or gallops.  No pitting edema in bilateral lower extremities PULM: Clear to auscultation bilaterally  NEURO: Oriented to self, but not place or time, no other focal deficits  Assessment/Plan:  Principal Problem:   Seizure ([place]) Active Problems:   Hyponatremia   AKI (acute kidney injury) ([place])   Dementia ([place])  In summary, patient is a 78 year old female with a past medical history notable for dementia, type 2 diabetes mellitus, HTN, HLD who presented from her home withAMSthe morning of 9/4 reportedly experiencing full body convulsions while on the toilet. She was felt to likely have had a generalized tonic-clonic seizure due to hyponatremia which is since been treated and stable. Her course has been complicated by A. fib with RVR and bradycardia but is now stable.  #Dementia #Acute metabolic encephalopathy:At baseline, the patient has had a decline in mental status and function over the last 2 months. She has not been seen by a physician in several years. Family reportedly found facility that she will stay permanently after discharge. 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 EEG showed cortical dysfunction of the R frontotemporal region, likely secondary to underlying chronic structural disease. -Appreciate neurology  recommendations: -Continue KeppraPO500 mg q12hr -Diazepam 5 mg everyq12hr PRN  #NewOnsetA-fib w/ [id number] intermittentlyinRVRanddiltiazem drip was initiated.Echocardiogramnotable for aortic valve calcifications, but did not show evidence of thrombus.CHA2DS2VASC score of6, with a 9.7% stroke risk supports anticoagulation. Over the past 24 hours the patient self converted to sinus rhythm. - Continue metoprolol 75 mg daily - Continue Eliquis 5 mg BID - Scheduled f/u in the a-fib clinic (appointment scheduled on 9/14)  #Thrombocytosis #Leukocytosis: Pt's WBC and platelet count increased to 14.9 and 461 but decreased to 11.3 and 382 today. No fevers noted in the past 24 hrs. No evidence of infection on physical exam. -Continue to monitor  #FEN/GI -Diet: Heart healthy - IV fluids:none  #DVT prophylaxis: - Eliquis 5 mg BID  #Codestatus: Full  #Dispo: Anticipated  discharge today to SNF.   Earlene Plater, MD Internal Medicine, PGY1 Pager: [phone]  06/25/2019,3:32 PM

## 2019-06-25 NOTE — TOC Transition Note (Signed)
Transition of Care [facility]) - CM/SW Discharge Note   Patient Details  Name: Kendra Charles MRN: [id number] Date of Birth: April 12, 1941  Transition of Care [facility]) CM/SW Contact:  Benard Halsted, LCSW Phone Number: 06/25/2019, 11:54 AM   Clinical Narrative:    Patient will DC to: [facility] Anticipated DC date: 06/25/19 Family notified: Bailey Mech Transport by: Corey Harold   Please make sure signed Valium script goes with patient.   Per MD patient ready for DC to [facility]. RN, patient, patient's family, and facility notified of DC. Discharge Summary and FL2 sent to facility. RN to call report prior to discharge [phone] Room 3213). DC packet on chart. Ambulance transport requested for patient.   CSW will sign off for now as social work intervention is no longer needed. Please consult [place] again if new needs arise.  Cedric Fishman, LCSW Clinical Social Worker [phone]    Final next level of care: Skilled Nursing Facility Barriers to Discharge: No Barriers Identified   Patient Goals and CMS Choice Patient states their goals for this hospitalization and ongoing recovery are:: family states that their goal is to find assisted living placement for pt in a location convienent for their family      Discharge Placement   Existing PASRR number confirmed : 06/23/19          Patient chooses bed at: [facility] Patient to be transferred to facility by: [place] Name of family member notified: Janann Colonel and patient's son Patient and family notified of of transfer: 06/23/19  Discharge Plan and Services In-house Referral: Clinical Social Work              DME Arranged: N/A DME Agency: NA       HH Arranged: NA HH Agency: NA        Social Determinants of Health ([place]) Interventions     Readmission Risk Interventions Readmission Risk Prevention Plan 06/19/2019  Transportation Screening Complete  PCP or Specialist Appt within 5-7 Days Complete  Home Care Screening  Complete  Medication Review (RN CM) Complete

## 2019-06-25 NOTE — TOC Progression Note (Signed)
Transition of Care [facility]) - Progression Note    Patient Details  Name: Kendra Charles MRN: [id number] Date of Birth: 09-18-1941  Transition of Care [facility]) CM/SW [place], LCSW Phone Number: 06/25/2019, 8:30 AM  Clinical Narrative:    CSW updated [facility] that patient may be ready to discharge today if medically stable. If patient does not discharge today, [facility] may give her bed away and we will need to conduct another SNF search. CSW to continue to follow.   Expected Discharge Plan: Skilled Nursing Facility Barriers to Discharge: No Barriers Identified  Expected Discharge Plan and Services Expected Discharge Plan: [place] In-house Referral: Clinical Social Work     Living arrangements for the past 2 months: Single Family Home(Pt has been residing with her sister, and then with daughter, and is now placed with her son, [name].) Expected Discharge Date: 06/23/19               DME Arranged: N/A DME Agency: NA       HH Arranged: NA [place] Agency: NA         Social Determinants of Health (SDOH) Interventions    Readmission Risk Interventions Readmission Risk Prevention Plan 06/19/2019  Transportation Screening Complete  PCP or Specialist Appt within 5-7 Days Complete  Home Care Screening Complete  Medication Review (RN CM) Complete

## 2019-06-29 ENCOUNTER — Encounter (HOSPITAL_COMMUNITY): Payer: Self-pay | Admitting: Nurse Practitioner

## 2019-06-29 ENCOUNTER — Other Ambulatory Visit: Payer: Self-pay

## 2019-06-29 ENCOUNTER — Ambulatory Visit (HOSPITAL_COMMUNITY)
Admission: RE | Admit: 2019-06-29 | Discharge: 2019-06-29 | Disposition: A | Discharge status: Home / Self Care | Payer: No Typology Code available for payment source | Source: Ambulatory Visit | Attending: Nurse Practitioner | Admitting: Nurse Practitioner

## 2019-06-29 VITALS — BP 142/90 | HR 81 | Ht 65.0 in | Wt 130.1 lb

## 2019-06-29 DIAGNOSIS — Z87891 Personal history of nicotine dependence: Secondary | ICD-10-CM | POA: Diagnosis not present

## 2019-06-29 DIAGNOSIS — I480 Paroxysmal atrial fibrillation: Secondary | ICD-10-CM | POA: Diagnosis not present

## 2019-06-29 DIAGNOSIS — G40909 Epilepsy, unspecified, not intractable, without status epilepticus: Secondary | ICD-10-CM | POA: Insufficient documentation

## 2019-06-29 DIAGNOSIS — Z7901 Long term (current) use of anticoagulants: Secondary | ICD-10-CM | POA: Diagnosis not present

## 2019-06-29 DIAGNOSIS — E7800 Pure hypercholesterolemia, unspecified: Secondary | ICD-10-CM | POA: Diagnosis not present

## 2019-06-29 DIAGNOSIS — I10 Essential (primary) hypertension: Secondary | ICD-10-CM | POA: Diagnosis not present

## 2019-06-29 DIAGNOSIS — Z79899 Other long term (current) drug therapy: Secondary | ICD-10-CM | POA: Diagnosis not present

## 2019-06-29 DIAGNOSIS — I4891 Unspecified atrial fibrillation: Secondary | ICD-10-CM | POA: Diagnosis not present

## 2019-06-29 DIAGNOSIS — F0390 Unspecified dementia without behavioral disturbance: Secondary | ICD-10-CM | POA: Insufficient documentation

## 2019-06-29 DIAGNOSIS — Z8249 Family history of ischemic heart disease and other diseases of the circulatory system: Secondary | ICD-10-CM | POA: Insufficient documentation

## 2019-06-29 DIAGNOSIS — R9431 Abnormal electrocardiogram [ECG] [EKG]: Secondary | ICD-10-CM | POA: Insufficient documentation

## 2019-06-29 LAB — BASIC METABOLIC PANEL
Anion gap: 14 (ref 5–15)
BUN: 14 mg/dL (ref 8–23)
CO2: 19 mmol/L — ABNORMAL LOW (ref 22–32)
Calcium: 9.5 mg/dL (ref 8.9–10.3)
Chloride: 98 mmol/L (ref 98–111)
Creatinine, Ser: 0.72 mg/dL (ref 0.44–1.00)
GFR calc Af Amer: >60 mL/min (ref >60)
GFR calc non Af Amer: >60 mL/min (ref >60)
Glucose, Bld: 124 mg/dL — ABNORMAL HIGH (ref 70–99)
Potassium: 3.7 mmol/L (ref 3.5–5.1)
Sodium: 131 mmol/L — ABNORMAL LOW (ref 135–145)

## 2019-06-29 LAB — CBC
HCT: 38.6 % (ref 36.0–46.0)
Hemoglobin: 12.7 g/dL (ref 12.0–15.0)
MCH: 30.6 pg (ref 26.0–34.0)
MCHC: 32.9 g/dL (ref 30.0–36.0)
MCV: 93 fL (ref 80.0–100.0)
Platelets: 467 10*3/uL — ABNORMAL HIGH (ref 150–400)
RBC: 4.15 MIL/uL (ref 3.87–5.11)
RDW: 13.7 % (ref 11.5–15.5)
WBC: 9.5 10*3/uL (ref 4.0–10.5)
nRBC: 0 % (ref 0.0–0.2)

## 2019-06-29 NOTE — Progress Notes (Signed)
Primary Care Physician: Patient, No Pcp Per Referring Physician: [facility] f/u    Kendra Charles is a 78 y.o. female with h/o dementia , seizure disorder with  recent admission from her SNF to [facility]  for  hypovolemic hypotonic hyponatremia and new onset afib with RVR. Creatinine was elevated but returned to normal status with hydration. She was started on BB and anticoagulation for new onset afib. She is here with her DIL today. She  is in SR. Pt does respond to questions but her conversation is not always appropriate.   Today, she denies symptoms of palpitations, chest pain, shortness of breath, orthopnea, PND, lower extremity edema, dizziness, presyncope, syncope, or neurologic sequela. The patient is tolerating medications without difficulties and is otherwise without complaint today.   Past Medical History:  Diagnosis Date  . Hypercholesteremia   . Hypertension    No past surgical history on file.  Current Outpatient Medications  Medication Sig Dispense Refill  . apixaban (ELIQUIS) 5 MG TABS tablet Take 1 tablet (5 mg total) by mouth 2 (two) times daily. 60 tablet   . diazepam (VALIUM) 5 MG tablet Take 1 tablet (5 mg total) by mouth every 12 (twelve) hours as needed for anxiety or muscle spasms. 30 tablet 0  . levETIRAcetam (KEPPRA) 500 MG tablet Take 1 tablet (500 mg total) by mouth 2 (two) times daily.    Marland Kitchen lisinopril (ZESTRIL) 30 MG tablet Take 30 mg by mouth daily.    Marland Kitchen lovastatin (MEVACOR) 40 MG tablet Take 40 mg by mouth daily.    . Metoprolol Tartrate 75 MG TABS Take 75 mg by mouth 2 (two) times daily.     No current facility-administered medications for this encounter.     No Known Allergies  Social History   Socioeconomic History  . Marital status: Single    Spouse name: Not on file  . Number of children: Not on file  . Years of education: Not on file  . Highest education level: Not on file  Occupational History  . Not on file  Social Needs  . Financial resource  strain: Not on file  . Food insecurity    Worry: Not on file    Inability: Not on file  . Transportation needs    Medical: Not on file    Non-medical: Not on file  Tobacco Use  . Smoking status: Former Research scientist (life sciences)  . Smokeless tobacco: Never Used  Substance and Sexual Activity  . Alcohol use: Not Currently  . Drug use: Not Currently  . Sexual activity: Not Currently  Lifestyle  . Physical activity    Days per week: Not on file    Minutes per session: Not on file  . Stress: Not on file  Relationships  . Social Herbalist on phone: Not on file    Gets together: Not on file    Attends religious service: Not on file    Active member of club or organization: Not on file    Attends meetings of clubs or organizations: Not on file    Relationship status: Not on file  . Intimate partner violence    Fear of current or ex partner: Not on file    Emotionally abused: Not on file    Physically abused: Not on file    Forced sexual activity: Not on file  Other Topics Concern  . Not on file  Social History Narrative  . Not on file    Family History  Problem  Relation Age of Onset  . Hypertension Mother   . Hypertension Father     ROS- All systems are reviewed and negative except as per the HPI above  Physical Exam: Vitals:   06/29/19 1438  BP: (!) 142/90  Pulse: 81  Weight: 59 kg  Height: 5\' 5"  (1.651 m)   Wt Readings from Last 3 Encounters:  06/29/19 59 kg  06/25/19 58.1 kg    Labs: Lab Results  Component Value Date   NA 133 (L) 06/25/2019   K 3.5 06/25/2019   CL 99 06/25/2019   CO2 23 06/25/2019   GLUCOSE 88 06/25/2019   BUN 11 06/25/2019   CREATININE 0.97 06/25/2019   CALCIUM 9.0 06/25/2019   PHOS 3.4 06/21/2019   MG 1.7 06/23/2019   Lab Results  Component Value Date   INR 1.1 06/20/2019   No results found for: CHOL, HDL, LDLCALC, TRIG   GEN- The patient is well appearing, alert and oriented x 3 today.   Head- normocephalic, atraumatic Eyes-   Sclera clear, conjunctiva pink Ears- hearing intact Oropharynx- clear Neck- supple, no JVP Lymph- no cervical lymphadenopathy Lungs- Clear to ausculation bilaterally, normal work of breathing Heart -regular rate and rhythm, no murmurs, rubs or gallops, PMI not laterally displaced GI- soft, NT, ND, + BS Extremities- no clubbing, cyanosis, or edema MS- no significant deformity or atrophy Skin- no rash or lesion Psych- euthymic mood, full affect Neuro- strength and sensation are intact  EKG-  NSR at 81 bpm, pr int 158 ms, qrs int 82 ms, qtc 476 ms    Assessment and Plan: 1. New onset afib General education re afib discussed with daughter in law  Probably driven by her overall state when admitted to hospital  Now in SR Continue metoprolol 75 mg bid   2. CHA2DS2VASc score of at least 4 Bleeding precautions discussed  Continue eliquis 5 mg bid   Cbc/bmet today   F/u with PCP at [place]. Kendra Charles, [facility] [address] [place],  [address] [phone]

## 2019-08-11 ENCOUNTER — Encounter (HOSPITAL_COMMUNITY): Payer: Self-pay | Admitting: Emergency Medicine

## 2019-08-11 ENCOUNTER — Emergency Department (HOSPITAL_COMMUNITY)
Admission: EM | Admit: 2019-08-11 | Discharge: 2019-08-12 | Disposition: A | Discharge status: Home / Self Care | Payer: Medicare Other | Attending: Emergency Medicine | Admitting: Emergency Medicine

## 2019-08-11 ENCOUNTER — Other Ambulatory Visit: Payer: Self-pay

## 2019-08-11 DIAGNOSIS — N814 Uterovaginal prolapse, unspecified: Secondary | ICD-10-CM | POA: Insufficient documentation

## 2019-08-11 DIAGNOSIS — I10 Essential (primary) hypertension: Secondary | ICD-10-CM | POA: Diagnosis not present

## 2019-08-11 DIAGNOSIS — F0390 Unspecified dementia without behavioral disturbance: Secondary | ICD-10-CM | POA: Insufficient documentation

## 2019-08-11 DIAGNOSIS — Z87891 Personal history of nicotine dependence: Secondary | ICD-10-CM | POA: Diagnosis not present

## 2019-08-11 DIAGNOSIS — Z7901 Long term (current) use of anticoagulants: Secondary | ICD-10-CM | POA: Diagnosis not present

## 2019-08-11 DIAGNOSIS — Z79899 Other long term (current) drug therapy: Secondary | ICD-10-CM | POA: Insufficient documentation

## 2019-08-11 DIAGNOSIS — N3000 Acute cystitis without hematuria: Secondary | ICD-10-CM | POA: Diagnosis not present

## 2019-08-11 DIAGNOSIS — R112 Nausea with vomiting, unspecified: Secondary | ICD-10-CM | POA: Diagnosis present

## 2019-08-11 LAB — COMPREHENSIVE METABOLIC PANEL
ALT: 12 U/L (ref 0–44)
AST: 30 U/L (ref 15–41)
Albumin: 3.7 g/dL (ref 3.5–5.0)
Alkaline Phosphatase: 74 U/L (ref 38–126)
Anion gap: 16 — ABNORMAL HIGH (ref 5–15)
BUN: 37 mg/dL — ABNORMAL HIGH (ref 8–23)
CO2: 23 mmol/L (ref 22–32)
Calcium: 9.3 mg/dL (ref 8.9–10.3)
Chloride: 100 mmol/L (ref 98–111)
Creatinine, Ser: 1.1 mg/dL — ABNORMAL HIGH (ref 0.44–1.00)
GFR calc Af Amer: 56 mL/min — ABNORMAL LOW (ref >60)
GFR calc non Af Amer: 48 mL/min — ABNORMAL LOW (ref >60)
Glucose, Bld: 162 mg/dL — ABNORMAL HIGH (ref 70–99)
Potassium: 3.7 mmol/L (ref 3.5–5.1)
Sodium: 139 mmol/L (ref 135–145)
Total Bilirubin: 1.3 mg/dL — ABNORMAL HIGH (ref 0.3–1.2)
Total Protein: 6.4 g/dL — ABNORMAL LOW (ref 6.5–8.1)

## 2019-08-11 LAB — CBC WITH DIFFERENTIAL/PLATELET
Abs Immature Granulocytes: 0.07 10*3/uL (ref 0.00–0.07)
Basophils Absolute: 0.1 10*3/uL (ref 0.0–0.1)
Basophils Relative: 1 %
Eosinophils Absolute: 0.1 10*3/uL (ref 0.0–0.5)
Eosinophils Relative: 1 %
HCT: 44.7 % (ref 36.0–46.0)
Hemoglobin: 14.1 g/dL (ref 12.0–15.0)
Immature Granulocytes: 1 %
Lymphocytes Relative: 18 %
Lymphs Abs: 2.8 10*3/uL (ref 0.7–4.0)
MCH: 29.6 pg (ref 26.0–34.0)
MCHC: 31.5 g/dL (ref 30.0–36.0)
MCV: 93.9 fL (ref 80.0–100.0)
Monocytes Absolute: 1.6 10*3/uL — ABNORMAL HIGH (ref 0.1–1.0)
Monocytes Relative: 11 %
Neutro Abs: 10.9 10*3/uL — ABNORMAL HIGH (ref 1.7–7.7)
Neutrophils Relative %: 68 %
Platelets: 419 10*3/uL — ABNORMAL HIGH (ref 150–400)
RBC: 4.76 MIL/uL (ref 3.87–5.11)
RDW: 14.7 % (ref 11.5–15.5)
WBC: 15.5 10*3/uL — ABNORMAL HIGH (ref 4.0–10.5)
nRBC: 0 % (ref 0.0–0.2)

## 2019-08-11 LAB — URINALYSIS, ROUTINE W REFLEX MICROSCOPIC
Bilirubin Urine: NEGATIVE
Glucose, UA: NEGATIVE mg/dL
Hgb urine dipstick: NEGATIVE
Ketones, ur: 5 mg/dL — AB
Nitrite: NEGATIVE
Protein, ur: 30 mg/dL — AB
Specific Gravity, Urine: 1.024 (ref 1.005–1.030)
pH: 5 (ref 5.0–8.0)

## 2019-08-11 MED ORDER — SODIUM CHLORIDE 0.9 % IV BOLUS
1000.0000 mL | Freq: Once | INTRAVENOUS | Status: AC
  Administered 2019-08-11: 1000 mL via INTRAVENOUS

## 2019-08-11 MED ORDER — CEPHALEXIN 250 MG PO CAPS: 500.0000 mg | ORAL_CAPSULE | Freq: Once | ORAL | Status: DC

## 2019-08-11 MED ORDER — CEPHALEXIN 500 MG PO CAPS: 500.0000 mg | ORAL_CAPSULE | Freq: Two times a day (BID) | ORAL | 0 refills | Status: AC

## 2019-08-11 NOTE — ED Triage Notes (Signed)
Pt here from [facility] with C/O soft tissue outside of vaginal area and N/V x2 days.  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  EMS administered 4mg  of Zofran PTA.   EMS Vitals: BP 116/56 HR 60

## 2019-08-11 NOTE — Consult Note (Signed)
78 yo P4 brought in by EMS from nursing home for evaluation of uterine prolapse. Patient reports feeling something "pop out" intermittently for the past year, particularly when having a bowel movement or any other valsalva efforts. Patient initially thought it was a hemorrhoid. Patient denies any vaginal bleeding, pain, or urinary retention.   Past Medical History:  Diagnosis Date  . Hypercholesteremia   . Hypertension    History reviewed. No pertinent surgical history. Family History  Problem Relation Age of Onset  . Hypertension Mother   . Hypertension Father    Social History   Tobacco Use  . Smoking status: Former Research scientist (life sciences)  . Smokeless tobacco: Never Used  Substance Use Topics  . Alcohol use: Not Currently  . Drug use: Not Currently   ROS See pertinent in HPI  Blood pressure 116/69, pulse 61, temperature (!) 97.5 F (36.4 C), temperature source Oral, resp. rate 18, SpO2 96 %. GENERAL: Well-developed, well-nourished female in no acute distress.  ABDOMEN: Soft, nontender, nondistended. No organomegaly. PELVIC: Normal external female genitalia. Vagina is pale and atrophic.  Complete uterine prolapse, easily reduced EXTREMITIES: No cyanosis, clubbing, or edema, 2+ distal pulses.  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

## 2019-08-11 NOTE — Discharge Instructions (Signed)
Take antibiotics as directed. Please take all of your antibiotics until finished.  As we discussed, the uterus was reinserted today.  This needs more permanent solution and OB/GYN will follow up.  We will plan to see you this Friday.  They will contact you for time.  If you have not heard from them in the next 24 to 48 hours, please call their office and arrange for an appointment.  Return the emergency department for any worsening nausea, fevers, abdominal pain, difficulty breathing or any other worsening concerning symptoms.

## 2019-08-11 NOTE — ED Provider Notes (Signed)
[facility] EMERGENCY DEPARTMENT Provider Note   CSN: [id number] Arrival date & time: 08/11/19  1429     History   Chief Complaint Chief Complaint  Patient presents with  . Nausea  . Prolapsed Uterus    HPI Kendra Charles is a 78 y.o. female brought in by EMS from [employer] pulses who presents for evaluation of nausea/vomiting and diarrhea that is been intermittently occurring for about a week.  Also comes from concerns of nursing staff about uterine prolapse.  Patient states that she thinks it is a hemorrhoid and it comes out intermittently.  She states that sometimes it bleeds when it catches.  Patient is states that she will have one episode of nausea/vomiting and diarrhea day and then it stops.  No blood noted in stools or emesis.  She has not noted any fevers.  Patient states that occasionally, she has some intermittent abdominal pain.  She denies any chest pain, difficulty breathing, cough, urinary complaints.    The history is provided by the patient.    Past Medical History:  Diagnosis Date  . Hypercholesteremia   . Hypertension     Patient Active Problem List   Diagnosis Date Noted  . Seizure ([place]) 06/19/2019  . Hyponatremia 06/18/2019  . AKI (acute kidney injury) ([place]) 06/18/2019  . Dementia ([place]) 06/18/2019  . Essential hypertension 06/18/2019  . Hyperlipidemia 06/18/2019    History reviewed. No pertinent surgical history.   OB History   No obstetric history on file.      Home Medications    Prior to Admission medications   Medication Sig Start Date End Date Taking? Authorizing Provider  apixaban (ELIQUIS) 5 MG TABS tablet Take 1 tablet (5 mg total) by mouth 2 (two) times daily. 06/23/19   Earlene Plater, MD  cephALEXin (KEFLEX) 500 MG capsule Take 1 capsule (500 mg total) by mouth 2 (two) times daily for 7 days. 08/11/19 08/18/19  Volanda Napoleon, PA-C  diazepam (VALIUM) 5 MG tablet Take 1 tablet (5 mg total) by mouth every 12  (twelve) hours as needed for anxiety or muscle spasms. 06/25/19   Earlene Plater, MD  levETIRAcetam (KEPPRA) 500 MG tablet Take 1 tablet (500 mg total) by mouth 2 (two) times daily. 06/25/19   Earlene Plater, MD  lisinopril (ZESTRIL) 30 MG tablet Take 30 mg by mouth daily.    [provider]  lovastatin (MEVACOR) 40 MG tablet Take 40 mg by mouth daily. 06/17/19   [provider]  Metoprolol Tartrate 75 MG TABS Take 75 mg by mouth 2 (two) times daily. 06/25/19   Earlene Plater, MD    Family History Family History  Problem Relation Age of Onset  . Hypertension Mother   . Hypertension Father     Social History Social History   Tobacco Use  . Smoking status: Former Research scientist (life sciences)  . Smokeless tobacco: Never Used  Substance Use Topics  . Alcohol use: Not Currently  . Drug use: Not Currently     Allergies   Patient has no known allergies.   Review of Systems Review of Systems  Constitutional: Negative for fever.  Respiratory: Negative for shortness of breath.   Cardiovascular: Negative for chest pain.  Gastrointestinal: Positive for abdominal pain, nausea and vomiting.  Genitourinary: Negative for dysuria and hematuria.  Neurological: Negative for headaches.  All other systems reviewed and are negative.    Physical Exam Updated Vital Signs BP 116/69   Pulse 61   Temp (!) 97.5 F (36.4  C) (Oral)   Resp 18   SpO2 96%   Physical Exam Vitals signs and nursing note reviewed. Exam conducted with a chaperone present.  Constitutional:      Appearance: Normal appearance. She is well-developed.     Comments: Elderly appearing   HENT:     Head: Normocephalic and atraumatic.  Eyes:     General: Lids are normal.     Conjunctiva/sclera: Conjunctivae normal.     Pupils: Pupils are equal, round, and reactive to light.  Neck:     Musculoskeletal: Full passive range of motion without pain.  Cardiovascular:     Rate and Rhythm: Normal rate and regular rhythm.      Pulses: Normal pulses.     Heart sounds: Normal heart sounds. No murmur. No friction rub. No gallop.   Pulmonary:     Effort: Pulmonary effort is normal.     Breath sounds: Normal breath sounds.     Comments: Lungs clear to auscultation bilaterally.  Symmetric chest rise.  No wheezing, rales, rhonchi. Abdominal:     Palpations: Abdomen is soft. Abdomen is not rigid.     Tenderness: There is no abdominal tenderness. There is no guarding.     Comments: Abdomen is soft, nondistended.  Generalized tenderness noted diffusely.  No focal tenderness.  No rigidity, guarding.  Genitourinary:    Urethra: Prolapse present.     Comments: Prolapsed uterus noted.  No surrounding necrotic tissue. Musculoskeletal: Normal range of motion.  Skin:    General: Skin is warm and dry.     Capillary Refill: Capillary refill takes less than 2 seconds.  Neurological:     Mental Status: She is alert and oriented to person, place, and time.  Psychiatric:        Speech: Speech normal.      ED Treatments / Results  Labs (all labs ordered are listed, but only abnormal results are displayed) Labs Reviewed  URINALYSIS, ROUTINE W REFLEX MICROSCOPIC - Abnormal; Notable for the following components:      Result Value   APPearance CLOUDY (*)    Ketones, ur 5 (*)    Protein, ur 30 (*)    Leukocytes,Ua LARGE (*)    Bacteria, UA RARE (*)    All other components within normal limits  COMPREHENSIVE METABOLIC PANEL - Abnormal; Notable for the following components:   Glucose, Bld 162 (*)    BUN 37 (*)    Creatinine, Ser 1.10 (*)    Total Protein 6.4 (*)    Total Bilirubin 1.3 (*)    GFR calc non Af Amer 48 (*)    GFR calc Af Amer 56 (*)    Anion gap 16 (*)    All other components within normal limits  CBC WITH DIFFERENTIAL/PLATELET - Abnormal; Notable for the following components:   WBC 15.5 (*)    Platelets 419 (*)    Neutro Abs 10.9 (*)    Monocytes Absolute 1.6 (*)    All other components within normal  limits    EKG None  Radiology No results found.  Procedures Procedures (including critical care time)  Medications Ordered in ED Medications  cephALEXin (KEFLEX) capsule 500 mg (has no administration in time range)  sodium chloride 0.9 % bolus 1,000 mL (0 mLs Intravenous Stopped 08/11/19 1914)     Initial Impression / Assessment and Plan / ED Course  I have reviewed the triage vital signs and the nursing notes.  Pertinent labs & imaging results that were  available during my care of the patient were reviewed by me and considered in my medical decision making (see chart for details).        78 year old female who presents for evaluation of nausea/vomiting and diarrhea.  Also sent from nursing home concerns of prolapsed uterus.  Patient with history of prolapse uterus that will resolve on its own.  She is unclear of how long it has been going out.  Denies any fevers.  States she occasionally has some generalized abdominal pain.  No focal tenderness.  On initial ED arrival, she is afebrile, nontoxic, nontoxic-appearing.  Vitals are stable.  Abdomen exam with no rigidity, guarding.  She does have some mild diffuse tenderness with no focal point.  On GU exam, she has a complete prolapse of uterus.  We will plan to check basic labs to ensure no electrolyte abnormality, infectious process.  Discussed patient with Dr. Nehemiah Settle (OB/GYN) who will evaluate patient in the ED.  Dr. Elly Modena (OB/GYN) reduced uterine prolapse seen in the ED. She will arrange for outpatient follow-up with OB/GYN for definitive treatment and pessary placement.  UA does show large leukocytes, pyuria.  She does have some squamous epithelium present.  CMP shows BUN of 37, creatinine of 1.10.  She does have a slight anion gap of 16. Fluids given.  CBC shows leukocytosis of 15.5.  Review of records show she has had previous leukocytosis before.  We will plan to treat her urine will obtain culture.  Repeat evaluation.   Patient has been able to tolerate p.o. without any difficulty.  Reports abdominal pain is improved.  Repeat exam is benign with no signs of rigidity, guarding.  She has been getting up and walking without any signs of distress.  She is hemodynamically stable.  OB/GYN will arrange for outpatient follow-up for pessary for prolapsed uterus.  At this time, patient's abdomen is benign and no indication that she needs further CT imaging as I do not suspect surgical abdomen. Discussed patient with Dr. Sherry Ruffing who is agreeable to plan. At this time, patient exhibits no emergent life-threatening condition that require further evaluation in ED or admission. Patient had ample opportunity for questions and discussion. All patient's questions were answered with full understanding. Strict return precautions discussed. Patient expresses understanding and agreement to plan.     Portions of this note were generated with [employer]. Dictation errors may occur despite best attempts at proofreading.     Final Clinical Impressions(s) / ED Diagnoses   Final diagnoses:  Acute cystitis without hematuria  Prolapsed uterus    ED Discharge Orders         Ordered    cephALEXin (KEFLEX) 500 MG capsule  2 times daily     08/11/19 1757           Volanda Napoleon, PA-C 08/11/19 2215    Tegeler, Gwenyth Allegra, MD 08/12/19 [phone]

## 2019-08-11 NOTE — ED Notes (Signed)
Urine Culture sent to Main Lab with UA 

## 2019-08-11 NOTE — ED Notes (Signed)
Pt aware we need urine specimen.  

## 2019-08-11 NOTE — ED Notes (Signed)
[facility] called  

## 2019-08-14 ENCOUNTER — Other Ambulatory Visit: Payer: Self-pay

## 2019-08-14 ENCOUNTER — Ambulatory Visit (INDEPENDENT_AMBULATORY_CARE_PROVIDER_SITE_OTHER): Payer: Medicare Other | Admitting: Obstetrics & Gynecology

## 2019-08-14 VITALS — BP 142/89 | HR 86 | Wt 122.0 lb

## 2019-08-14 DIAGNOSIS — N813 Complete uterovaginal prolapse: Secondary | ICD-10-CM

## 2019-08-14 NOTE — Progress Notes (Signed)
   Subjective:    Patient ID: Kendra Charles, female    DOB: 1941-04-16, 78 y.o.   MRN: [id number]  HPI 78 yo P4 here today as follow up after an ER visit for complete uterine prolapse. She lives at an assisted living facility and this prolapse has been occurring for at least a year. Generally, she is able to push it back inside but on the night of the ER visit she was not able to do so. She denies any difficulties with bowel or bladder function.   Review of Systems     Objective:   Physical Exam Breathing and ambulating normally Well nourished, well hydrated White female, no apparent distress She is not oriented to her age (thinks that she is 78 years old)  Complete uterine prolapse, easily reducible     Assessment & Plan:  I spoke with her and the assistant that brought her from the assisted living facility. I offered a pessary with the understanding that she would need to come here every month for its cleaning. She tells me that she prefers not to have the pessary and just push it back in prn.

## 2019-09-04 ENCOUNTER — Emergency Department (HOSPITAL_COMMUNITY): Payer: Medicare Other

## 2019-09-04 ENCOUNTER — Encounter (HOSPITAL_COMMUNITY): Payer: Self-pay

## 2019-09-04 ENCOUNTER — Inpatient Hospital Stay (HOSPITAL_COMMUNITY)
Admission: EM | Admit: 2019-09-04 | Discharge: 2019-09-08 | DRG: 177 | Disposition: A | Discharge status: Skilled Nursing Facility | Payer: Medicare Other | Attending: Internal Medicine | Admitting: Internal Medicine

## 2019-09-04 DIAGNOSIS — R569 Unspecified convulsions: Secondary | ICD-10-CM | POA: Diagnosis present

## 2019-09-04 DIAGNOSIS — J9601 Acute respiratory failure with hypoxia: Secondary | ICD-10-CM | POA: Diagnosis present

## 2019-09-04 DIAGNOSIS — E7800 Pure hypercholesterolemia, unspecified: Secondary | ICD-10-CM | POA: Diagnosis present

## 2019-09-04 DIAGNOSIS — Z7901 Long term (current) use of anticoagulants: Secondary | ICD-10-CM | POA: Diagnosis not present

## 2019-09-04 DIAGNOSIS — Z8249 Family history of ischemic heart disease and other diseases of the circulatory system: Secondary | ICD-10-CM | POA: Diagnosis not present

## 2019-09-04 DIAGNOSIS — Z87891 Personal history of nicotine dependence: Secondary | ICD-10-CM | POA: Diagnosis not present

## 2019-09-04 DIAGNOSIS — I480 Paroxysmal atrial fibrillation: Secondary | ICD-10-CM

## 2019-09-04 DIAGNOSIS — E876 Hypokalemia: Secondary | ICD-10-CM | POA: Diagnosis not present

## 2019-09-04 DIAGNOSIS — H9190 Unspecified hearing loss, unspecified ear: Secondary | ICD-10-CM | POA: Diagnosis present

## 2019-09-04 DIAGNOSIS — U071 COVID-19: Principal | ICD-10-CM

## 2019-09-04 DIAGNOSIS — J1289 Other viral pneumonia: Secondary | ICD-10-CM | POA: Diagnosis present

## 2019-09-04 DIAGNOSIS — R32 Unspecified urinary incontinence: Secondary | ICD-10-CM | POA: Diagnosis present

## 2019-09-04 DIAGNOSIS — I10 Essential (primary) hypertension: Secondary | ICD-10-CM | POA: Diagnosis present

## 2019-09-04 DIAGNOSIS — N179 Acute kidney failure, unspecified: Secondary | ICD-10-CM | POA: Diagnosis present

## 2019-09-04 DIAGNOSIS — Z87898 Personal history of other specified conditions: Secondary | ICD-10-CM

## 2019-09-04 DIAGNOSIS — E785 Hyperlipidemia, unspecified: Secondary | ICD-10-CM | POA: Diagnosis present

## 2019-09-04 DIAGNOSIS — Z79891 Long term (current) use of opiate analgesic: Secondary | ICD-10-CM

## 2019-09-04 DIAGNOSIS — F0390 Unspecified dementia without behavioral disturbance: Secondary | ICD-10-CM | POA: Diagnosis present

## 2019-09-04 DIAGNOSIS — E871 Hypo-osmolality and hyponatremia: Secondary | ICD-10-CM | POA: Diagnosis present

## 2019-09-04 DIAGNOSIS — N390 Urinary tract infection, site not specified: Secondary | ICD-10-CM

## 2019-09-04 LAB — CBC WITH DIFFERENTIAL/PLATELET
Abs Immature Granulocytes: 0.03 10*3/uL (ref 0.00–0.07)
Basophils Absolute: 0 10*3/uL (ref 0.0–0.1)
Basophils Relative: 0 %
Eosinophils Absolute: 0 10*3/uL (ref 0.0–0.5)
Eosinophils Relative: 0 %
HCT: 39.9 % (ref 36.0–46.0)
Hemoglobin: 12.8 g/dL (ref 12.0–15.0)
Immature Granulocytes: 1 %
Lymphocytes Relative: 17 %
Lymphs Abs: 1 10*3/uL (ref 0.7–4.0)
MCH: 29 pg (ref 26.0–34.0)
MCHC: 32.1 g/dL (ref 30.0–36.0)
MCV: 90.5 fL (ref 80.0–100.0)
Monocytes Absolute: 0.4 10*3/uL (ref 0.1–1.0)
Monocytes Relative: 7 %
Neutro Abs: 4.6 10*3/uL (ref 1.7–7.7)
Neutrophils Relative %: 75 %
Platelets: 230 10*3/uL (ref 150–400)
RBC: 4.41 MIL/uL (ref 3.87–5.11)
RDW: 14.1 % (ref 11.5–15.5)
WBC: 6 10*3/uL (ref 4.0–10.5)
nRBC: 0 % (ref 0.0–0.2)

## 2019-09-04 LAB — COMPREHENSIVE METABOLIC PANEL
ALT: 12 U/L (ref 0–44)
AST: 22 U/L (ref 15–41)
Albumin: 4 g/dL (ref 3.5–5.0)
Alkaline Phosphatase: 76 U/L (ref 38–126)
Anion gap: 11 (ref 5–15)
BUN: 12 mg/dL (ref 8–23)
CO2: 25 mmol/L (ref 22–32)
Calcium: 8.8 mg/dL — ABNORMAL LOW (ref 8.9–10.3)
Chloride: 96 mmol/L — ABNORMAL LOW (ref 98–111)
Creatinine, Ser: 0.7 mg/dL (ref 0.44–1.00)
GFR calc Af Amer: >60 mL/min (ref >60)
GFR calc non Af Amer: >60 mL/min (ref >60)
Glucose, Bld: 101 mg/dL — ABNORMAL HIGH (ref 70–99)
Potassium: 3.3 mmol/L — ABNORMAL LOW (ref 3.5–5.1)
Sodium: 132 mmol/L — ABNORMAL LOW (ref 135–145)
Total Bilirubin: 0.4 mg/dL (ref 0.3–1.2)
Total Protein: 7 g/dL (ref 6.5–8.1)

## 2019-09-04 LAB — URINALYSIS, ROUTINE W REFLEX MICROSCOPIC
Bilirubin Urine: NEGATIVE
Glucose, UA: NEGATIVE mg/dL
Hgb urine dipstick: NEGATIVE
Ketones, ur: NEGATIVE mg/dL
Nitrite: NEGATIVE
Protein, ur: NEGATIVE mg/dL
Specific Gravity, Urine: 1.034 — ABNORMAL HIGH (ref 1.005–1.030)
pH: 6 (ref 5.0–8.0)

## 2019-09-04 LAB — C-REACTIVE PROTEIN: CRP: 7.4 mg/dL — ABNORMAL HIGH (ref <1.0)

## 2019-09-04 LAB — PROCALCITONIN: Procalcitonin: <0.1 ng/mL

## 2019-09-04 LAB — LACTATE DEHYDROGENASE: LDH: 174 U/L (ref 98–192)

## 2019-09-04 LAB — TROPONIN I (HIGH SENSITIVITY)
Troponin I (High Sensitivity): 19 ng/L — ABNORMAL HIGH (ref <18)
Troponin I (High Sensitivity): 17 ng/L (ref <18)

## 2019-09-04 LAB — POC SARS CORONAVIRUS 2 AG -  ED: SARS Coronavirus 2 Ag: POSITIVE — AB

## 2019-09-04 LAB — LIPASE, BLOOD: Lipase: 32 U/L (ref 11–51)

## 2019-09-04 LAB — CK TOTAL AND CKMB (NOT AT ARMC)
CK, MB: 2.4 ng/mL (ref 0.5–5.0)
Relative Index: 0.8 (ref 0.0–2.5)
Total CK: 311 U/L — ABNORMAL HIGH (ref 38–234)

## 2019-09-04 LAB — FERRITIN: Ferritin: 270 ng/mL (ref 11–307)

## 2019-09-04 LAB — BRAIN NATRIURETIC PEPTIDE: B Natriuretic Peptide: 286.2 pg/mL — ABNORMAL HIGH (ref 0.0–100.0)

## 2019-09-04 LAB — LACTIC ACID, PLASMA: Lactic Acid, Venous: 1.1 mmol/L (ref 0.5–1.9)

## 2019-09-04 LAB — D-DIMER, QUANTITATIVE: D-Dimer, Quant: 0.98 ug/mL-FEU — ABNORMAL HIGH (ref 0.00–0.50)

## 2019-09-04 MED ORDER — ACETAMINOPHEN 325 MG PO TABS
650.0000 mg | ORAL_TABLET | Freq: Once | ORAL | Status: AC
  Administered 2019-09-04: 12:00:00 650 mg via ORAL
  Filled 2019-09-04: qty 2

## 2019-09-04 MED ORDER — SODIUM CHLORIDE 0.9 % IV SOLN
100.0000 mg | INTRAVENOUS | Status: AC
  Administered 2019-09-06 – 2019-09-08 (×3): 100 mg via INTRAVENOUS
  Filled 2019-09-04 (×3): qty 100

## 2019-09-04 MED ORDER — SODIUM CHLORIDE 0.9 % IV SOLN
1.0000 g | INTRAVENOUS | Status: AC
  Administered 2019-09-04 – 2019-09-05 (×2): 1 g via INTRAVENOUS
  Filled 2019-09-04 (×3): qty 10

## 2019-09-04 MED ORDER — SODIUM CHLORIDE 0.9 % IV BOLUS
1000.0000 mL | Freq: Once | INTRAVENOUS | Status: AC
  Administered 2019-09-04: 12:00:00 1000 mL via INTRAVENOUS

## 2019-09-04 MED ORDER — FLEET ENEMA 7-19 GM/118ML RE ENEM: 1.0000 | ENEMA | Freq: Once | RECTAL | Status: DC | PRN

## 2019-09-04 MED ORDER — SODIUM CHLORIDE 0.9 % IV SOLN
200.0000 mg | Freq: Once | INTRAVENOUS | Status: AC
  Administered 2019-09-04: 200 mg via INTRAVENOUS
  Filled 2019-09-04: qty 40

## 2019-09-04 MED ORDER — TRAZODONE HCL 50 MG PO TABS
25.0000 mg | ORAL_TABLET | Freq: Every evening | ORAL | Status: DC | PRN
  Administered 2019-09-05: 25 mg via ORAL
  Filled 2019-09-04: qty 1

## 2019-09-04 MED ORDER — ACETAMINOPHEN 325 MG PO TABS
650.0000 mg | ORAL_TABLET | Freq: Once | ORAL | Status: AC
  Administered 2019-09-04: 650 mg via ORAL
  Filled 2019-09-04: qty 2

## 2019-09-04 MED ORDER — MELATONIN 3 MG PO TABS
3.0000 mg | ORAL_TABLET | Freq: Every day | ORAL | Status: DC
  Administered 2019-09-04 – 2019-09-08 (×4): 3 mg via ORAL
  Filled 2019-09-04 (×5): qty 1

## 2019-09-04 MED ORDER — DIAZEPAM 2 MG PO TABS
2.0000 mg | ORAL_TABLET | Freq: Two times a day (BID) | ORAL | Status: DC | PRN
  Administered 2019-09-07 – 2019-09-08 (×2): 2 mg via ORAL
  Filled 2019-09-04 (×2): qty 1

## 2019-09-04 MED ORDER — APIXABAN 5 MG PO TABS
5.0000 mg | ORAL_TABLET | Freq: Two times a day (BID) | ORAL | Status: DC
  Administered 2019-09-04 – 2019-09-08 (×8): 5 mg via ORAL
  Filled 2019-09-04 (×8): qty 1

## 2019-09-04 MED ORDER — ONDANSETRON HCL 4 MG/2ML IJ SOLN
4.0000 mg | Freq: Four times a day (QID) | INTRAMUSCULAR | Status: DC | PRN
  Administered 2019-09-08: 4 mg via INTRAVENOUS
  Filled 2019-09-04: qty 2

## 2019-09-04 MED ORDER — SODIUM CHLORIDE (PF) 0.9 % IJ SOLN
INTRAMUSCULAR | Status: AC
  Filled 2019-09-04: qty 50

## 2019-09-04 MED ORDER — SODIUM CHLORIDE 0.9 % IV SOLN
100.0000 mg | INTRAVENOUS | Status: AC
  Administered 2019-09-05: 17:00:00 100 mg via INTRAVENOUS
  Filled 2019-09-04: qty 100

## 2019-09-04 MED ORDER — ACETAMINOPHEN 325 MG PO TABS
650.0000 mg | ORAL_TABLET | Freq: Four times a day (QID) | ORAL | Status: DC | PRN
  Administered 2019-09-04 – 2019-09-05 (×2): 650 mg via ORAL
  Filled 2019-09-04 (×2): qty 2

## 2019-09-04 MED ORDER — GUAIFENESIN-DM 100-10 MG/5ML PO SYRP: 10.0000 mL | ORAL_SOLUTION | ORAL | Status: DC | PRN

## 2019-09-04 MED ORDER — BISACODYL 10 MG RE SUPP: 10.0000 mg | Freq: Every day | RECTAL | Status: DC | PRN

## 2019-09-04 MED ORDER — IPRATROPIUM-ALBUTEROL 20-100 MCG/ACT IN AERS
1.0000 | INHALATION_SPRAY | Freq: Four times a day (QID) | RESPIRATORY_TRACT | Status: DC
  Administered 2019-09-05 – 2019-09-08 (×14): 1 via RESPIRATORY_TRACT
  Filled 2019-09-04: qty 4

## 2019-09-04 MED ORDER — SENNA 8.6 MG PO TABS
1.0000 | ORAL_TABLET | Freq: Two times a day (BID) | ORAL | Status: DC
  Administered 2019-09-04 – 2019-09-08 (×5): 8.6 mg via ORAL
  Filled 2019-09-04 (×6): qty 1

## 2019-09-04 MED ORDER — ONDANSETRON HCL 4 MG PO TABS: 4.0000 mg | ORAL_TABLET | Freq: Four times a day (QID) | ORAL | Status: DC | PRN

## 2019-09-04 MED ORDER — POTASSIUM CHLORIDE CRYS ER 20 MEQ PO TBCR
40.0000 meq | EXTENDED_RELEASE_TABLET | ORAL | Status: AC
  Administered 2019-09-04 (×2): 40 meq via ORAL
  Filled 2019-09-04 (×2): qty 2

## 2019-09-04 MED ORDER — METOPROLOL TARTRATE 50 MG PO TABS
50.0000 mg | ORAL_TABLET | Freq: Two times a day (BID) | ORAL | Status: DC
  Administered 2019-09-05 – 2019-09-08 (×7): 50 mg via ORAL
  Filled 2019-09-04: qty 2
  Filled 2019-09-04 (×8): qty 1

## 2019-09-04 MED ORDER — POTASSIUM CHLORIDE IN NACL 20-0.9 MEQ/L-% IV SOLN
INTRAVENOUS | Status: DC
  Administered 2019-09-04 – 2019-09-05 (×3): via INTRAVENOUS
  Filled 2019-09-04 (×2): qty 1000

## 2019-09-04 MED ORDER — ZINC SULFATE 220 (50 ZN) MG PO CAPS
220.0000 mg | ORAL_CAPSULE | Freq: Every day | ORAL | Status: DC
  Administered 2019-09-04 – 2019-09-08 (×5): 220 mg via ORAL
  Filled 2019-09-04 (×5): qty 1

## 2019-09-04 MED ORDER — ADULT MULTIVITAMIN W/MINERALS CH
1.0000 | ORAL_TABLET | Freq: Every day | ORAL | Status: DC
  Administered 2019-09-04 – 2019-09-08 (×5): 1 via ORAL
  Filled 2019-09-04 (×5): qty 1

## 2019-09-04 MED ORDER — DONEPEZIL HCL 10 MG PO TABS
5.0000 mg | ORAL_TABLET | Freq: Every day | ORAL | Status: DC
  Administered 2019-09-04 – 2019-09-08 (×4): 5 mg via ORAL
  Filled 2019-09-04 (×4): qty 1

## 2019-09-04 MED ORDER — LEVETIRACETAM 500 MG PO TABS
500.0000 mg | ORAL_TABLET | Freq: Two times a day (BID) | ORAL | Status: DC
  Administered 2019-09-04 – 2019-09-08 (×8): 500 mg via ORAL
  Filled 2019-09-04 (×8): qty 1

## 2019-09-04 MED ORDER — VITAMIN C 500 MG PO TABS
500.0000 mg | ORAL_TABLET | Freq: Every day | ORAL | Status: DC
  Administered 2019-09-05 – 2019-09-08 (×4): 500 mg via ORAL
  Filled 2019-09-04 (×4): qty 1

## 2019-09-04 MED ORDER — POLYETHYLENE GLYCOL 3350 17 G PO PACK: 17.0000 g | PACK | Freq: Every day | ORAL | Status: DC | PRN

## 2019-09-04 MED ORDER — DEXAMETHASONE SODIUM PHOSPHATE 10 MG/ML IJ SOLN
6.0000 mg | Freq: Every day | INTRAMUSCULAR | Status: DC
  Administered 2019-09-04 – 2019-09-08 (×5): 6 mg via INTRAVENOUS
  Filled 2019-09-04 (×5): qty 1

## 2019-09-04 MED ORDER — IOHEXOL 350 MG/ML SOLN
100.0000 mL | Freq: Once | INTRAVENOUS | Status: AC | PRN
  Administered 2019-09-04: 14:00:00 100 mL via INTRAVENOUS

## 2019-09-04 NOTE — ED Notes (Signed)
Provider at bedside

## 2019-09-04 NOTE — ED Notes (Signed)
ED Provider at bedside. 

## 2019-09-04 NOTE — ED Notes (Signed)
Spoke with Dr. Regenia Skeeter about in and out cath and prolapse uterus.. Pure wick applied.

## 2019-09-04 NOTE — ED Notes (Signed)
[facility] called. 

## 2019-09-04 NOTE — ED Notes (Signed)
[facility] updated on pts plan of care.

## 2019-09-04 NOTE — ED Notes (Signed)
Pt arrived to ED in two briefs, both soiled with urine. NT changed the patient and applied a Purewick.

## 2019-09-04 NOTE — ED Provider Notes (Signed)
[facility] DEPT Provider Note   CSN: [id number] Arrival date & time: 09/04/19  1110     History   Chief Complaint Chief Complaint  Patient presents with   Weakness    HPI Kendra Charles is a 78 y.o. female.     HPI 78 year old female sent in from her facility for abdominal and back pain and fever.  Temperature was up to 100.  Started on antibiotics for UTI.  The patient tells me that she has had some on and off diffuse abdominal pain for about 3 days.  States she fell today because she got dizzy when standing and thinks she hit her head but also her left back.  She denies fever.  I discussed with caretaker at the facility who states that the patient has seemed confused today.  Was presumptively started on antibiotics for a presumed UTI with Bactrim, first dose this morning.  Has been complaining about her back since yesterday and this morning states she could not get out of bed.  Has also become incontinent.  No cough, fevers, or known falls.  Past Medical History:  Diagnosis Date   Hypercholesteremia    Hypertension     Patient Active Problem List   Diagnosis Date Noted   Acute hypoxemic respiratory failure due to COVID-19 [facility]) 09/04/2019   Seizure ([place]) 06/19/2019   Hyponatremia 06/18/2019   AKI (acute kidney injury) ([place]) 06/18/2019   Dementia ([place]) 06/18/2019   Essential hypertension 06/18/2019   Hyperlipidemia 06/18/2019    No past surgical history on file.   OB History   No obstetric history on file.      Home Medications    Prior to Admission medications   Medication Sig Start Date End Date Taking? Authorizing Provider  apixaban (ELIQUIS) 5 MG TABS tablet Take 1 tablet (5 mg total) by mouth 2 (two) times daily. 06/23/19   Earlene Plater, MD  diazepam (VALIUM) 5 MG tablet Take 1 tablet (5 mg total) by mouth every 12 (twelve) hours as needed for anxiety or muscle spasms. 06/25/19   Earlene Plater, MD   levETIRAcetam (KEPPRA) 500 MG tablet Take 1 tablet (500 mg total) by mouth 2 (two) times daily. 06/25/19   Earlene Plater, MD  lisinopril (ZESTRIL) 30 MG tablet Take 30 mg by mouth daily.    [provider]  lovastatin (MEVACOR) 40 MG tablet Take 40 mg by mouth daily. 06/17/19   [provider]  Metoprolol Tartrate 75 MG TABS Take 75 mg by mouth 2 (two) times daily. 06/25/19   Earlene Plater, MD    Family History Family History  Problem Relation Age of Onset   Hypertension Mother    Hypertension Father     Social History Social History   Tobacco Use   Smoking status: Former Smoker   Smokeless tobacco: Never Used  Substance Use Topics   Alcohol use: Not Currently   Drug use: Not Currently     Allergies   Patient has no known allergies.   Review of Systems Review of Systems  Constitutional: Positive for fever.  Cardiovascular: Negative for chest pain.  Gastrointestinal: Positive for abdominal pain.  Musculoskeletal: Positive for back pain.  Neurological: Negative for headaches.  All other systems reviewed and are negative.    Physical Exam Updated Vital Signs BP 140/70    Pulse 62    Temp (!) 100.4 F (38 C) (Rectal)    Resp (!) 30    SpO2 100%   Physical Exam Vitals  signs and nursing note reviewed.  Constitutional:      Appearance: She is well-developed.  HENT:     Head: Normocephalic and atraumatic.     Right Ear: External ear normal.     Left Ear: External ear normal.     Nose: Nose normal.  Eyes:     General:        Right eye: No discharge.        Left eye: No discharge.  Cardiovascular:     Rate and Rhythm: Normal rate and regular rhythm.     Heart sounds: Normal heart sounds.  Pulmonary:     Effort: Pulmonary effort is normal.     Breath sounds: Normal breath sounds.  Abdominal:     General: There is no distension.     Palpations: Abdomen is soft.     Tenderness: There is no abdominal tenderness.  Musculoskeletal:      Thoracic back: She exhibits tenderness.       Back:  Skin:    General: Skin is warm and dry.  Neurological:     Mental Status: She is alert and oriented to person, place, and time.     Comments: Awake, alert, oriented to person, place and time. 5/5 strength in all 4 extremities.  Psychiatric:        Mood and Affect: Mood is not anxious.      ED Treatments / Results  Labs (all labs ordered are listed, but only abnormal results are displayed) Labs Reviewed  COMPREHENSIVE METABOLIC PANEL - Abnormal; Notable for the following components:      Result Value   Sodium 132 (*)    Potassium 3.3 (*)    Chloride 96 (*)    Glucose, Bld 101 (*)    Calcium 8.8 (*)    All other components within normal limits  POC SARS CORONAVIRUS 2 AG -  ED - Abnormal; Notable for the following components:   SARS Coronavirus 2 Ag POSITIVE (*)    All other components within normal limits  CULTURE, BLOOD (ROUTINE X 2)  CULTURE, BLOOD (ROUTINE X 2)  URINE CULTURE  LACTIC ACID, PLASMA  CBC WITH DIFFERENTIAL/PLATELET  LIPASE, BLOOD  LACTIC ACID, PLASMA  URINALYSIS, ROUTINE W REFLEX MICROSCOPIC  BRAIN NATRIURETIC PEPTIDE  CK TOTAL AND CKMB (NOT AT [facility])  C-REACTIVE PROTEIN  D-DIMER, QUANTITATIVE (NOT AT [facility])  FERRITIN  LACTATE DEHYDROGENASE  PROCALCITONIN  TROPONIN I (HIGH SENSITIVITY)    EKG EKG Interpretation  Date/Time:  Friday September 04 2019 12:06:17 EST Ventricular Rate:  61 PR Interval:    QRS Duration: 89 QT Interval:  518 QTC Calculation: 522 R Axis:   44 Text Interpretation: Sinus rhythm Atrial premature complexes Prolonged QT interval Confirmed by Sherwood Gambler [phone]) on 09/04/2019 12:23:27 PM   Radiology Ct Head Wo Contrast  Result Date: 09/04/2019 CLINICAL DATA:  Head trauma, on anticoagulation EXAM: CT HEAD WITHOUT CONTRAST TECHNIQUE: Contiguous axial images were obtained from the base of the skull through the vertex without intravenous contrast. COMPARISON:  June 18, 2019 FINDINGS: Brain: There is no acute intracranial hemorrhage, mass-effect, or edema. There is no new loss of gray-white differentiation. 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 Additional confluent hypoattenuation in the supratentorial white matter likely reflects stable advanced chronic microvascular ischemic changes. Prominence of the ventricles and sulci reflects stable parenchymal volume loss. There is stable disproportionate ventricular prominence and communicating (normal  pressure) hydrocephalus is not excluded. There is no extra-axial fluid collection. Vascular: There is atherosclerotic calcification at the skull base. Skull: Calvarium is unremarkable. Sinuses/Orbits: No acute finding. Other: None. IMPRESSION: No evidence of acute intracranial injury. Stable chronic findings detailed above. Electronically Signed   By: Macy Mis M.D.   On: 09/04/2019 14:56   Ct Angio Chest Pe W And/or Wo Contrast  Result Date: 09/04/2019 CLINICAL DATA:  Chest pain and abdominal pain. Generalized weakness. COVID-19 positive. EXAM: CT ANGIOGRAPHY CHEST CT ABDOMEN AND PELVIS WITH CONTRAST TECHNIQUE: Multidetector CT imaging of the chest was performed using the standard protocol during bolus administration of intravenous contrast. Multiplanar CT image reconstructions and MIPs were obtained to evaluate the vascular anatomy. Multidetector CT imaging of the abdomen and pelvis was performed using the standard protocol during bolus administration of intravenous contrast. CONTRAST:  176mL OMNIPAQUE IOHEXOL 350 MG/ML SOLN COMPARISON:  None. FINDINGS: CTA CHEST FINDINGS Cardiovascular: The heart is normal in size. No pericardial effusion. There is tortuosity, ectasia and moderate atherosclerotic calcification of the thoracic aorta but no dissection. There is fusiform aneurysmal dilatation of the ascending aorta  with maximum diameter of 4.3 cm. The pulmonary arterial tree is well opacified. No filling defects to suggest pulmonary embolism. Mediastinum/Nodes: Small scattered mediastinal and hilar lymph nodes but no mass or overt adenopathy. None of these measures more than 8 mm. The esophagus is grossly normal. Lungs/Pleura: Advanced emphysematous changes are noted along with pulmonary scarring. There are patchy bilateral peripheral ground-glass type infiltrates and interstitial thickening consistent with atypical viral pneumonia such as COVID-19. No focal airspace consolidation or pleural effusion. No worrisome pulmonary lesions. Musculoskeletal: No breast masses, supraclavicular or axillary adenopathy. The thyroid gland is grossly normal. The bony thorax is intact. No worrisome bone lesions. Moderate osteoporosis is noted and there are remote healed rib fractures noted. Review of the MIP images confirms the above findings. CT ABDOMEN and PELVIS FINDINGS Hepatobiliary: No focal hepatic lesions or intrahepatic biliary dilatation. The gallbladder is normal. No common bile duct dilatation. Pancreas: No mass, inflammation or ductal dilatation. Spleen: Normal size.  No focal lesions. Adrenals/Urinary Tract: Adrenal glands and kidneys are unremarkable. No worrisome renal lesions or hydronephrosis. Stomach/Bowel: The stomach, duodenum, small bowel and colon are grossly normal without oral contrast. No acute inflammatory changes, mass lesions or obstructive findings. Benign-appearing lipoma is noted in the descending duodenum. There is fairly significant diverticulosis involving the descending colon and sigmoid colon but no findings for acute diverticulitis. Vascular/Lymphatic: Advanced atherosclerotic calcifications involving the aorta and iliac arteries and major branch vessel ostia. The major venous structures are patent. No mesenteric or retroperitoneal mass or lymphadenopathy the. Reproductive: Surgically absent. Other:  Evidence of pelvic floor relaxation. There is a moderate cystocele and possible vaginal prolapse also. Musculoskeletal: No significant bony findings. Osteoporosis is noted but no fractures or bone lesions. Review of the MIP images confirms the above findings. IMPRESSION: 1. No CT findings for pulmonary embolism. 2. Tortuosity, ectasia and moderate atherosclerotic calcification involving the thoracic aorta. 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 Recommend annual imaging followup by CTA or MRA. This recommendation follows 2010 ACCF/AHA/AATS/ACR/ASA/SCA/SCAI/SIR/STS/SVM Guidelines for the Diagnosis and Management of Patients with Thoracic Aortic Disease. Circulation. 2010; [id number]. Aortic aneurysm NOS (ICD10-I71.9) 3. Patchy ground-glass infiltrates suggesting atypical viral pneumonia such as COVID-19. 4. Emphysematous changes and pulmonary scarring but no worrisome pulmonary lesions. 5. No acute abdominal/pelvic findings, mass lesions or lymphadenopathy. 6. Advanced atherosclerotic calcifications involving  the aorta and iliac arteries and branch vessels but no aneurysm. 7. Evidence of pelvic floor laxity with cystocele and probable vaginal prolapse. Electronically Signed   By: Marijo Sanes M.D.   On: 09/04/2019 15:09   Ct Abdomen Pelvis W Contrast  Result Date: 09/04/2019 CLINICAL DATA:  Chest pain and abdominal pain. Generalized weakness. COVID-19 positive. EXAM: CT ANGIOGRAPHY CHEST CT ABDOMEN AND PELVIS WITH CONTRAST TECHNIQUE: Multidetector CT imaging of the chest was performed using the standard protocol during bolus administration of intravenous contrast. Multiplanar CT image reconstructions and MIPs were obtained to evaluate the vascular anatomy. Multidetector CT imaging of the abdomen and pelvis was performed using the standard protocol during bolus administration of intravenous contrast. CONTRAST:  168mL OMNIPAQUE IOHEXOL 350 MG/ML SOLN  COMPARISON:  None. FINDINGS: CTA CHEST FINDINGS Cardiovascular: The heart is normal in size. No pericardial effusion. There is tortuosity, ectasia and moderate atherosclerotic calcification of the thoracic aorta but no dissection. 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 The pulmonary arterial tree is well opacified. No filling defects to suggest pulmonary embolism. Mediastinum/Nodes: Small scattered mediastinal and hilar lymph nodes but no mass or overt adenopathy. None of these measures more than 8 mm. The esophagus is grossly normal. Lungs/Pleura: Advanced emphysematous changes are noted along with pulmonary scarring. There are patchy bilateral peripheral ground-glass type infiltrates and interstitial thickening consistent with atypical viral pneumonia such as COVID-19. No focal airspace consolidation or pleural effusion. No worrisome pulmonary lesions. Musculoskeletal: No breast masses, supraclavicular or axillary adenopathy. The thyroid gland is grossly normal. The bony thorax is intact. No worrisome bone lesions. Moderate osteoporosis is noted and there are remote healed rib fractures noted. Review of the MIP images confirms the above findings. CT ABDOMEN and PELVIS FINDINGS Hepatobiliary: No focal hepatic lesions or intrahepatic biliary dilatation. The gallbladder is normal. No common bile duct dilatation. Pancreas: No mass, inflammation or ductal dilatation. Spleen: Normal size.  No focal lesions. Adrenals/Urinary Tract: Adrenal glands and kidneys are unremarkable. No worrisome renal lesions or hydronephrosis. Stomach/Bowel: The stomach, duodenum, small bowel and colon are grossly normal without oral contrast. No acute inflammatory changes, mass lesions or obstructive findings. Benign-appearing lipoma is noted in the descending duodenum. There is fairly significant diverticulosis involving the descending colon and sigmoid colon but no findings for acute  diverticulitis. Vascular/Lymphatic: Advanced atherosclerotic calcifications involving the aorta and iliac arteries and major branch vessel ostia. The major venous structures are patent. No mesenteric or retroperitoneal mass or lymphadenopathy the. Reproductive: Surgically absent. Other: Evidence of pelvic floor relaxation. There is a moderate cystocele and possible vaginal prolapse also. Musculoskeletal: No significant bony findings. Osteoporosis is noted but no fractures or bone lesions. Review of the MIP images confirms the above findings. IMPRESSION: 1. No CT findings for pulmonary embolism. 2. Tortuosity, ectasia and moderate atherosclerotic calcification involving the thoracic aorta. 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 Recommend annual imaging followup by CTA or MRA. This recommendation follows 2010 ACCF/AHA/AATS/ACR/ASA/SCA/SCAI/SIR/STS/SVM Guidelines for the Diagnosis and Management of Patients with Thoracic Aortic Disease. Circulation. 2010; [id number]. Aortic aneurysm NOS (ICD10-I71.9) 3. Patchy ground-glass infiltrates suggesting atypical viral pneumonia such as COVID-19. 4. Emphysematous changes and pulmonary scarring but no worrisome pulmonary lesions. 5. No acute abdominal/pelvic findings, mass lesions or lymphadenopathy. 6. Advanced atherosclerotic calcifications involving the aorta and iliac arteries and branch vessels but no aneurysm. 7. Evidence of pelvic floor laxity with cystocele and probable vaginal  prolapse. Electronically Signed   By: Marijo Sanes M.D.   On: 09/04/2019 15:09   Dg Chest Port 1 View  Result Date: 09/04/2019 CLINICAL DATA:  Left-sided chest pain, back pain EXAM: PORTABLE CHEST 1 VIEW COMPARISON:  06/24/2019 FINDINGS: There is no focal consolidation. There is no pleural effusion or pneumothorax. The heart size is enlarged. There is no acute osseous abnormality. IMPRESSION: No active disease. Electronically  Signed   By: Kathreen Devoid   On: 09/04/2019 12:37    Procedures Procedures (including critical care time)  Medications Ordered in ED Medications  sodium chloride (PF) 0.9 % injection (has no administration in time range)  dexamethasone (DECADRON) injection 6 mg (has no administration in time range)  sodium chloride 0.9 % bolus 1,000 mL (1,000 mLs Intravenous New Bag/Given 09/04/19 1218)  acetaminophen (TYLENOL) tablet 650 mg (650 mg Oral Given 09/04/19 1219)  iohexol (OMNIPAQUE) 350 MG/ML injection 100 mL (100 mLs Intravenous Contrast Given 09/04/19 1412)     Initial Impression / Assessment and Plan / ED Course  I have reviewed the triage vital signs and the nursing notes.  Pertinent labs & imaging results that were available during my care of the patient were reviewed by me and considered in my medical decision making (see chart for details).        Patient is found to have infection with the novel coronavirus.  She does have O2 sats dropping into the high 80s.  Perhaps is mildly confused according to nursing staff at her facility.  While her labs are not significantly abnormal, given the new oxygen requirement she will need admission.  CT scans overall unremarkable otherwise for acute disease besides the pulmonary involvement. Hospitalist to admit.  Kendra Charles was evaluated in Emergency Department on 09/04/2019 for the symptoms described in the history of present illness. She was evaluated in the context of the global COVID-19 pandemic, which necessitated consideration that the patient might be at risk for infection with the SARS-CoV-2 virus that causes COVID-19. Institutional protocols and algorithms that pertain to the evaluation of patients at risk for COVID-19 are in a state of rapid change based on information released by regulatory bodies including the CDC and federal and state organizations. These policies and algorithms were followed during the patient's care in the ED.    Final Clinical Impressions(s) / ED Diagnoses   Final diagnoses:  COVID-19 virus infection    ED Discharge Orders    None       Sherwood Gambler, MD 09/04/19 [phone]

## 2019-09-04 NOTE — H&P (Signed)
History and Physical    Kendra Charles [id number] DOB: Nov 02, 1940 DOA: 09/04/2019  PCP: Patient, No Pcp Per Patient coming from: ALF  Chief Complaint: Nausea, weakness and generalized pain  HPI: Kendra Charles is a 78 y.o. female with history of paroxysmal A. fib, seizure disorder, hyponatremia, hypertension, dementia and hyperlipidemia presenting from ALF on 09/04/2019 with the above chief complaints.  Patient is not a great historian.  She has history of dementia.  She is also have somewhat diminished hearing.  She is oriented to self, partial place, month and family name.  She thinks we are still in 2001.  Patient states that she has been feeling nauseous and has not been able to eat well for the last couple of days.  As a result, she has been feeling weak. She also reports generalized body pain although she had some degree of this at baseline.  She denies headache, URI symptoms, sore throat, cough, chest pain, dyspnea, dizziness, palpitation, emesis, abdominal pain, diarrhea, dysuria, urgency or frequency.  She denies focal weakness, numbness or tingling.  Patient wishes to remain full code until she discuss this with her family.   Per ED provider, patient reported abdominal pain, back pain and fever.  She reported dizziness with standing, and fall at facility but facility didn't witness fall.  She was a started on Bactrim for presumed UTI at facility this morning.  Attempted to call patient's son, and his wife picked up the phone. However, wife was not able to provide additional history. She says her husband works in a place where he is not able to come to the phone.  Called her daughter who lives out of state but she couldn't provide additional history.  Daughter and daughter-in-law don't know about patient's CODE STATUS. However, daughter wishes her mother to be full code, until further discussion with patient's son who lives close by.  In ED, hemodynamically stable.  Febrile to  100.4.  Desaturated to 84% on room air.  Started on 2 L by nasal cannula and saturation improved to upper 90s on 100.  Sodium 132.  Potassium 3.3.  Otherwise, CBC and CMP not impressive.  Lactic acid negative.  UA with large LE and rare bacteria.  COVID-19 swab positive.  EKG sinus rhythm with occasional PACs but no acute ischemic finding or abnormal interval.  CTA chest negative for PE but groundglass opacities concerning for COVID-19 infection, emphysema and ascending thoracic aortic aneurysm.  No acute or significant finding within the abdomen or pelvis other than possible vaginal prolapse/cystocele.  CT head without acute finding.  Hospitalist was paged for admission for COVID-19 infection and possible UTI.  ROS All review of system negative except for pertinent positives and negatives as history of present illness above.  PMH Past Medical History:  Diagnosis Date   Hypercholesteremia    Hypertension    PSH History reviewed. No pertinent surgical history. Fam HX Family History  Problem Relation Age of Onset   Hypertension Mother    Hypertension Father     Social Hx  reports that she has quit smoking. She has never used smokeless tobacco. She reports previous alcohol use. She reports previous drug use.  Allergy No Known Allergies Home Meds Prior to Admission medications   Medication Sig Start Date End Date Taking? Authorizing Provider  acetaminophen (TYLENOL) 325 MG tablet Take 650 mg by mouth every 6 (six) hours as needed for mild pain.   Yes [provider]  alum & mag hydroxide-simeth (MAALOX/MYLANTA) 200-200-20 MG/5ML suspension  Take 30 mLs by mouth as needed for indigestion or heartburn.   Yes [provider]  apixaban (ELIQUIS) 5 MG TABS tablet Take 1 tablet (5 mg total) by mouth 2 (two) times daily. 06/23/19  Yes Earlene Plater, MD  diazepam (VALIUM) 5 MG tablet Take 1 tablet (5 mg total) by mouth every 12 (twelve) hours as needed for anxiety or muscle  spasms. 06/25/19  Yes Earlene Plater, MD  diclofenac Sodium (VOLTAREN) 1 % GEL Apply 2 g topically 4 (four) times daily.   Yes [provider]  donepezil (ARICEPT) 5 MG tablet Take 5 mg by mouth at bedtime.   Yes [provider]  levETIRAcetam (KEPPRA) 500 MG tablet Take 1 tablet (500 mg total) by mouth 2 (two) times daily. 06/25/19  Yes Earlene Plater, MD  lisinopril (ZESTRIL) 30 MG tablet Take 30 mg by mouth daily.   Yes [provider]  loperamide (IMODIUM) 2 MG capsule Take 2 mg by mouth as needed for diarrhea or loose stools.   Yes [provider]  lovastatin (MEVACOR) 40 MG tablet Take 40 mg by mouth at bedtime.  06/17/19  Yes [provider]  Melatonin 3 MG TABS Take 3 mg by mouth at bedtime.   Yes [provider]  Metoprolol Tartrate 75 MG TABS Take 75 mg by mouth 2 (two) times daily. Patient taking differently: Take 50 mg by mouth 2 (two) times daily.  06/25/19  Yes Earlene Plater, MD  sulfamethoxazole-trimethoprim (BACTRIM DS) 800-160 MG tablet Take 1 tablet by mouth 2 (two) times daily. For 7 days started 11.20.2020   Yes [provider]    Physical Exam: Vitals:   09/04/19 1330 09/04/19 1400 09/04/19 1449 09/04/19 1656  BP: 115/67 117/68 140/70 (!) 126/95  Pulse: 63 (!) 56 62 71  Resp: 16 16 (!) 30 (!) 25  Temp:      TempSrc:      SpO2: 99% 99% 100% 97%    GENERAL: No acute distress.  Appears well.  HEENT: MMM.  Vision grossly intact.  PERRL.  Hard of hearing. NECK: Supple.  No apparent JVD.  Full and painless range of motion RESP: Saturating at 100% on 2 L by [place].  No IWOB. Good air movement bilaterally.  No crackles or wheeze. CVS:  RRR. Heart sounds normal.  ABD/GI/GU: Bowel sounds present. Soft. Non tender.  MSK/EXT:  Moves extremities. No apparent deformity or edema.  No tenderness over spinous processes SKIN: no apparent skin lesion or wound NEURO: Awake, alert and oriented self, partial place, month  and family name.  Motor 4/5 in all extremities.  Light sensation intact. PSYCH: Calm. Normal affect.    Personally Reviewed Radiological Exams Ct Head Wo Contrast  Result Date: 09/04/2019 CLINICAL DATA:  Head trauma, on anticoagulation EXAM: CT HEAD WITHOUT CONTRAST TECHNIQUE: Contiguous axial images were obtained from the base of the skull through the vertex without intravenous contrast. COMPARISON:  June 18, 2019 FINDINGS: Brain: There is no acute intracranial hemorrhage, mass-effect, or edema. There is no new loss of gray-white differentiation. 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 Additional confluent hypoattenuation in the supratentorial white matter likely reflects stable advanced chronic microvascular ischemic changes. Prominence of the ventricles and sulci reflects stable parenchymal volume loss. There is stable disproportionate ventricular prominence and communicating (normal pressure) hydrocephalus is not excluded. There is no extra-axial fluid collection. Vascular: There is atherosclerotic calcification at  the skull base. Skull: Calvarium is unremarkable. Sinuses/Orbits: No acute finding. Other: None. IMPRESSION: No evidence of acute intracranial injury. Stable chronic findings detailed above. Electronically Signed   By: Macy Mis M.D.   On: 09/04/2019 14:56   Ct Angio Chest Pe W And/or Wo Contrast  Result Date: 09/04/2019 CLINICAL DATA:  Chest pain and abdominal pain. Generalized weakness. COVID-19 positive. EXAM: CT ANGIOGRAPHY CHEST CT ABDOMEN AND PELVIS WITH CONTRAST TECHNIQUE: Multidetector CT imaging of the chest was performed using the standard protocol during bolus administration of intravenous contrast. Multiplanar CT image reconstructions and MIPs were obtained to evaluate the vascular anatomy. Multidetector CT imaging of the abdomen and pelvis was performed  using the standard protocol during bolus administration of intravenous contrast. CONTRAST:  143mL OMNIPAQUE IOHEXOL 350 MG/ML SOLN COMPARISON:  None. FINDINGS: CTA CHEST FINDINGS Cardiovascular: The heart is normal in size. No pericardial effusion. There is tortuosity, ectasia and moderate atherosclerotic calcification of the thoracic aorta but no dissection. 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 The pulmonary arterial tree is well opacified. No filling defects to suggest pulmonary embolism. Mediastinum/Nodes: Small scattered mediastinal and hilar lymph nodes but no mass or overt adenopathy. None of these measures more than 8 mm. The esophagus is grossly normal. Lungs/Pleura: Advanced emphysematous changes are noted along with pulmonary scarring. There are patchy bilateral peripheral ground-glass type infiltrates and interstitial thickening consistent with atypical viral pneumonia such as COVID-19. No focal airspace consolidation or pleural effusion. No worrisome pulmonary lesions. Musculoskeletal: No breast masses, supraclavicular or axillary adenopathy. The thyroid gland is grossly normal. The bony thorax is intact. No worrisome bone lesions. Moderate osteoporosis is noted and there are remote healed rib fractures noted. Review of the MIP images confirms the above findings. CT ABDOMEN and PELVIS FINDINGS Hepatobiliary: No focal hepatic lesions or intrahepatic biliary dilatation. The gallbladder is normal. No common bile duct dilatation. Pancreas: No mass, inflammation or ductal dilatation. Spleen: Normal size.  No focal lesions. Adrenals/Urinary Tract: Adrenal glands and kidneys are unremarkable. No worrisome renal lesions or hydronephrosis. Stomach/Bowel: The stomach, duodenum, small bowel and colon are grossly normal without oral contrast. No acute inflammatory changes, mass lesions or obstructive findings. Benign-appearing lipoma is noted in the  descending duodenum. There is fairly significant diverticulosis involving the descending colon and sigmoid colon but no findings for acute diverticulitis. Vascular/Lymphatic: Advanced atherosclerotic calcifications involving the aorta and iliac arteries and major branch vessel ostia. The major venous structures are patent. No mesenteric or retroperitoneal mass or lymphadenopathy the. Reproductive: Surgically absent. Other: Evidence of pelvic floor relaxation. There is a moderate cystocele and possible vaginal prolapse also. Musculoskeletal: No significant bony findings. Osteoporosis is noted but no fractures or bone lesions. Review of the MIP images confirms the above findings. IMPRESSION: 1. No CT findings for pulmonary embolism. 2. Tortuosity, ectasia and moderate atherosclerotic calcification involving the thoracic aorta. 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 Recommend annual imaging followup by CTA or MRA. This recommendation follows 2010 ACCF/AHA/AATS/ACR/ASA/SCA/SCAI/SIR/STS/SVM Guidelines for the Diagnosis and Management of Patients with Thoracic Aortic Disease. Circulation. 2010; [id number]:[id number]. Aortic aneurysm NOS (ICD10-I71.9) 3. Patchy ground-glass infiltrates suggesting atypical viral pneumonia such as COVID-19. 4. Emphysematous changes and pulmonary scarring but no worrisome pulmonary lesions. 5. No acute abdominal/pelvic findings, mass lesions or lymphadenopathy. 6. Advanced atherosclerotic calcifications involving the aorta and iliac arteries and branch vessels but no aneurysm. 7. Evidence of pelvic floor laxity  with cystocele and probable vaginal prolapse. Electronically Signed   By: Marijo Sanes M.D.   On: 09/04/2019 15:09   Ct Abdomen Pelvis W Contrast  Result Date: 09/04/2019 CLINICAL DATA:  Chest pain and abdominal pain. Generalized weakness. COVID-19 positive. EXAM: CT ANGIOGRAPHY CHEST CT ABDOMEN AND PELVIS WITH CONTRAST  TECHNIQUE: Multidetector CT imaging of the chest was performed using the standard protocol during bolus administration of intravenous contrast. Multiplanar CT image reconstructions and MIPs were obtained to evaluate the vascular anatomy. Multidetector CT imaging of the abdomen and pelvis was performed using the standard protocol during bolus administration of intravenous contrast. CONTRAST:  116mL OMNIPAQUE IOHEXOL 350 MG/ML SOLN COMPARISON:  None. FINDINGS: CTA CHEST FINDINGS Cardiovascular: The heart is normal in size. No pericardial effusion. There is tortuosity, ectasia and moderate atherosclerotic calcification of the thoracic aorta but no dissection. 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 The pulmonary arterial tree is well opacified. No filling defects to suggest pulmonary embolism. Mediastinum/Nodes: Small scattered mediastinal and hilar lymph nodes but no mass or overt adenopathy. None of these measures more than 8 mm. The esophagus is grossly normal. Lungs/Pleura: Advanced emphysematous changes are noted along with pulmonary scarring. There are patchy bilateral peripheral ground-glass type infiltrates and interstitial thickening consistent with atypical viral pneumonia such as COVID-19. No focal airspace consolidation or pleural effusion. No worrisome pulmonary lesions. Musculoskeletal: No breast masses, supraclavicular or axillary adenopathy. The thyroid gland is grossly normal. The bony thorax is intact. No worrisome bone lesions. Moderate osteoporosis is noted and there are remote healed rib fractures noted. Review of the MIP images confirms the above findings. CT ABDOMEN and PELVIS FINDINGS Hepatobiliary: No focal hepatic lesions or intrahepatic biliary dilatation. The gallbladder is normal. No common bile duct dilatation. Pancreas: No mass, inflammation or ductal dilatation. Spleen: Normal size.  No focal lesions. Adrenals/Urinary Tract: Adrenal  glands and kidneys are unremarkable. No worrisome renal lesions or hydronephrosis. Stomach/Bowel: The stomach, duodenum, small bowel and colon are grossly normal without oral contrast. No acute inflammatory changes, mass lesions or obstructive findings. Benign-appearing lipoma is noted in the descending duodenum. There is fairly significant diverticulosis involving the descending colon and sigmoid colon but no findings for acute diverticulitis. Vascular/Lymphatic: Advanced atherosclerotic calcifications involving the aorta and iliac arteries and major branch vessel ostia. The major venous structures are patent. No mesenteric or retroperitoneal mass or lymphadenopathy the. Reproductive: Surgically absent. Other: Evidence of pelvic floor relaxation. There is a moderate cystocele and possible vaginal prolapse also. Musculoskeletal: No significant bony findings. Osteoporosis is noted but no fractures or bone lesions. Review of the MIP images confirms the above findings. IMPRESSION: 1. No CT findings for pulmonary embolism. 2. Tortuosity, ectasia and moderate atherosclerotic calcification involving the thoracic aorta. 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 Recommend annual imaging followup by CTA or MRA. This recommendation follows 2010 ACCF/AHA/AATS/ACR/ASA/SCA/SCAI/SIR/STS/SVM Guidelines for the Diagnosis and Management of Patients with Thoracic Aortic Disease. Circulation. 2010; [id number]. Aortic aneurysm NOS (ICD10-I71.9) 3. Patchy ground-glass infiltrates suggesting atypical viral pneumonia such as COVID-19. 4. Emphysematous changes and pulmonary scarring but no worrisome pulmonary lesions. 5. No acute abdominal/pelvic findings, mass lesions or lymphadenopathy. 6. Advanced atherosclerotic calcifications involving the aorta and iliac arteries and branch vessels but no aneurysm. 7. Evidence of pelvic floor laxity with cystocele and probable vaginal  prolapse. Electronically Signed   By: Marijo Sanes M.D.   On: 09/04/2019 15:09  Dg Chest Port 1 View  Result Date: 09/04/2019 CLINICAL DATA:  Left-sided chest pain, back pain EXAM: PORTABLE CHEST 1 VIEW COMPARISON:  06/24/2019 FINDINGS: There is no focal consolidation. There is no pleural effusion or pneumothorax. The heart size is enlarged. There is no acute osseous abnormality. IMPRESSION: No active disease. Electronically Signed   By: Kathreen Devoid   On: 09/04/2019 12:37     Personally Reviewed Labs: CBC: Recent Labs  Lab 09/04/19 1222  WBC 6.0  NEUTROABS 4.6  HGB 12.8  HCT 39.9  MCV 90.5  PLT [id number]   Basic Metabolic Panel: Recent Labs  Lab 09/04/19 1222  NA 132*  K 3.3*  CL 96*  CO2 25  GLUCOSE 101*  BUN 12  CREATININE 0.70  CALCIUM 8.8*   GFR: CrCl cannot be calculated (Unknown ideal weight.). Liver Function Tests: Recent Labs  Lab 09/04/19 1222  AST 22  ALT 12  ALKPHOS 76  BILITOT 0.4  PROT 7.0  ALBUMIN 4.0   Recent Labs  Lab 09/04/19 1222  LIPASE 32   No results for input(s): AMMONIA in the last 168 hours. Coagulation Profile: No results for input(s): INR, PROTIME in the last 168 hours. Cardiac Enzymes: No results for input(s): CKTOTAL, CKMB, CKMBINDEX, TROPONINI in the last 168 hours. BNP (last 3 results) No results for input(s): PROBNP in the last 8760 hours. HbA1C: No results for input(s): HGBA1C in the last 72 hours. CBG: No results for input(s): GLUCAP in the last 168 hours. Lipid Profile: No results for input(s): CHOL, HDL, LDLCALC, TRIG, CHOLHDL, LDLDIRECT in the last 72 hours. Thyroid Function Tests: No results for input(s): TSH, T4TOTAL, FREET4, T3FREE, THYROIDAB in the last 72 hours. Anemia Panel: No results for input(s): VITAMINB12, FOLATE, FERRITIN, TIBC, IRON, RETICCTPCT in the last 72 hours. Urine analysis:    Component Value Date/Time   COLORURINE YELLOW 09/04/2019 [place] 09/04/2019 1222   LABSPEC 1.034  (H) 09/04/2019 1222   PHURINE 6.0 09/04/2019 1222   GLUCOSEU NEGATIVE 09/04/2019 1222   HGBUR NEGATIVE 09/04/2019 1222   BILIRUBINUR NEGATIVE 09/04/2019 [place] 09/04/2019 1222   PROTEINUR NEGATIVE 09/04/2019 1222   NITRITE NEGATIVE 09/04/2019 1222   LEUKOCYTESUR LARGE (A) 09/04/2019 1222    Sepsis Labs:  Lactic acid within normal range.  Personally Reviewed EKG:  Sinus rhythm with occasional PACs.  Assessment/Plan Active Problems:   Acute hypoxemic respiratory failure due to COVID-19 [facility])  Acute hypoxic respiratory failure due to COVID-19 infection/pneumonia: COVID-19 test positive.  CTA chest with groundglass opacities.  Desaturated to 89% but recovered to upper 90s and 100 on 2 L by nasal cannula.  CBC not impressive.  CMP without transaminitis. -Check inflammatory markers and procalcitonin -Start dexamethasone.  Remdesivir per pharmacy -SABA/SAMA, mucolytic's and vitamins -Daily inflammatory markers  Paroxysmal atrial fibrillation: In sinus rhythm now.  CHA2DS-2-VASc score greater than 4. -Continue home metoprolol at 50 mg twice daily -Continue home Eliquis.  Urinary tract infection: reportedly had new urinary incontinence although she denies.  UA with large LE and rare bacteria -Follow urine cultures -Ceftriaxone  History of dementia: oriented to self, partial place, month and family name -Continue home Aricept -Frequent orientation delirium precautions  Hyponatremia/hypokalemia: Started on Bactrim this morning which could be contributing.  -Replenish K -Recheck  History of seizure -Continue home Keppra  DVT prophylaxis: On Eliquis for A. fib  Code Status: Full code-discussed admission with patient, daughter and daughter-in-law. Her daughter and daughter-in-law states that her son is  involved in her care. He is now at work and can't come to phone  Family Communication: Daughter and daughter-in-law as above. Disposition Plan: Admit to  [facility]-telemetry bed Consults called: None Admission status: Inpatient   Mercy Riding MD [facility]  If 7PM-7AM, please contact night-coverage [email or web address] Password [facility]  09/04/2019, 5:21 PM

## 2019-09-04 NOTE — ED Notes (Signed)
Phlebotomy contacted for blood work.

## 2019-09-04 NOTE — ED Triage Notes (Signed)
Transported by [facility] from [facility]-- reports of abdominal pain/back pain and generalized weakness. AAO x3. Staff reports recent UTI and fever of 100 at facility.

## 2019-09-05 DIAGNOSIS — E871 Hypo-osmolality and hyponatremia: Secondary | ICD-10-CM

## 2019-09-05 LAB — CBC WITH DIFFERENTIAL/PLATELET
Abs Immature Granulocytes: 0.03 10*3/uL (ref 0.00–0.07)
Basophils Absolute: 0 10*3/uL (ref 0.0–0.1)
Basophils Relative: 0 %
Eosinophils Absolute: 0 10*3/uL (ref 0.0–0.5)
Eosinophils Relative: 0 %
HCT: 36.6 % (ref 36.0–46.0)
Hemoglobin: 11.8 g/dL — ABNORMAL LOW (ref 12.0–15.0)
Immature Granulocytes: 1 %
Lymphocytes Relative: 36 %
Lymphs Abs: 1.4 10*3/uL (ref 0.7–4.0)
MCH: 29.1 pg (ref 26.0–34.0)
MCHC: 32.2 g/dL (ref 30.0–36.0)
MCV: 90.1 fL (ref 80.0–100.0)
Monocytes Absolute: 0.2 10*3/uL (ref 0.1–1.0)
Monocytes Relative: 4 %
Neutro Abs: 2.2 10*3/uL (ref 1.7–7.7)
Neutrophils Relative %: 59 %
Platelets: 241 10*3/uL (ref 150–400)
RBC: 4.06 MIL/uL (ref 3.87–5.11)
RDW: 14.3 % (ref 11.5–15.5)
WBC: 3.8 10*3/uL — ABNORMAL LOW (ref 4.0–10.5)
nRBC: 0 % (ref 0.0–0.2)

## 2019-09-05 LAB — ABO/RH: ABO/RH(D): O POS

## 2019-09-05 LAB — URINE CULTURE: Culture: NO GROWTH

## 2019-09-05 LAB — D-DIMER, QUANTITATIVE: D-Dimer, Quant: 0.68 ug/mL-FEU — ABNORMAL HIGH (ref 0.00–0.50)

## 2019-09-05 LAB — SAMPLE TO BLOOD BANK

## 2019-09-05 LAB — FERRITIN: Ferritin: 305 ng/mL (ref 11–307)

## 2019-09-05 LAB — MAGNESIUM: Magnesium: 1.9 mg/dL (ref 1.7–2.4)

## 2019-09-05 LAB — PHOSPHORUS: Phosphorus: 4 mg/dL (ref 2.5–4.6)

## 2019-09-05 LAB — C-REACTIVE PROTEIN: CRP: 10 mg/dL — ABNORMAL HIGH (ref <1.0)

## 2019-09-05 NOTE — Progress Notes (Signed)
PROGRESS NOTE    Kendra Charles  [id number] DOB: January 11, 1941 DOA: 09/04/2019 PCP: Patient, No Pcp Per   Brief Narrative:  Kendra Charles is a 78 y.o. female with history of paroxysmal A. fib, seizure disorder, hyponatremia, hypertension, dementia and hyperlipidemia presenting from ALF on 09/04/2019 with the above chief complaints.  Poor historian at [facility] given dementia. Per ED provider, patient reported abdominal pain, back pain and fever.  She reported dizziness with standing, and fall at facility but facility didn't witness fall.  She was a started on Bactrim for presumed UTI at facility this morning. In ED, hemodynamically stable.  Febrile to 100.4.  Desaturated to 84% on room air.  Started on 2 L by nasal cannula and saturation improved to upper 90s on 100.  Sodium 132.  Potassium 3.3.  Otherwise, CBC and CMP not impressive.  Lactic acid negative.  UA with large LE and rare bacteria.  COVID-19 swab positive.  EKG sinus rhythm with occasional PACs but no acute ischemic finding or abnormal interval.  CTA chest negative for PE but groundglass opacities concerning for COVID-19 infection, emphysema and ascending thoracic aortic aneurysm.  No acute or significant finding within the abdomen or pelvis other than possible vaginal prolapse/cystocele.  CT head without acute finding.  Hospitalist was paged for admission for COVID-19 infection and possible UTI.   Assessment & Plan:   Principal Problem:   Hyponatremia Active Problems:   AKI (acute kidney injury) ([place])   Dementia (H[facility])   Essential hypertension   Hyperlipidemia   Acute hypoxemic respiratory failure due to COVID-19 ([place])  Acute hypoxic respiratory failure due to COVID-19 infection/pneumonia:  -CTA chest with groundglass opacities.  SpO2: 96 % O2 Flow Rate (L/min): 2 L/min -Continue dexamethasone.  Remdesivir per pharmacy -SABA/SAMA, mucolytic's and vitamins Recent Labs    09/04/19 1656 09/04/19 1658 09/05/19 0730  DDIMER  --   0.98* 0.68*  FERRITIN 270  --  305  LDH  --  174  --   CRP 7.4*  --  10.0*    Paroxysmal atrial fibrillation, currently rate controlled: -In sinus rhythm now.  CHA2DS-2-VASc score greater than 4. -Continue home metoprolol at 50 mg twice daily -Continue home Eliquis.  Questionable urinary tract infection, POA:  -Reported new urinary incontinence from facility -UA with large LE and rare bacteria -Follow urine cultures -Ceftriaxone x3 days  Dementia:  -Oriented to self, general location(city state), month and name -Continue home Aricept -Frequent orientation delirium precautions  Hyponatremia/hypokalemia:  -Started on Bactrim this morning which could be contributing.  -Replenish K -Recheck  History of seizure -Continue home Keppra  DVT prophylaxis: On Eliquis for A. fib           Code Status: Full code-discussed admission with patient, daughter and daughter-in-law. Her daughter and daughter-in-law states that her son is involved in her care. He is now at work and can't come to phone  Family Communication: Daughter and daughter-in-law as above. Disposition Plan: Pending clinical course Consults called: None Admission status: Inpatient  Subjective: No acute issues or events overnight, its ongoing shortness of breath, somewhat poor historian.  Otherwise denies chest pain, nausea, vomiting, diarrhea, constipation, headache, fevers, chills.  Objective: Vitals:   09/05/19 0434 09/05/19 0739 09/05/19 0800 09/05/19 1200  BP: (!) 86/61 119/81 123/70 121/73  Pulse: (!) 57 (!) 53 (!) 43 (!) 59  Resp: 15 (!) 22 18 (!) 21  Temp: (!) 97 F (36.1 C) (!) 97.4 F (36.3 C)    TempSrc: Axillary Axillary  SpO2: 98% 98% 100% 96%  Weight:      Height:        Intake/Output Summary (Last 24 hours) at 09/05/2019 1530 Last data filed at 09/05/2019 1257 Gross per 24 hour  Intake 1622.55 ml  Output --  Net 1622.55 ml   Filed Weights   09/04/19 2214 09/05/19 0100  Weight:  55.3 kg 55.8 kg    Examination:  General:  Pleasantly resting in bed, No acute distress. HEENT:  Normocephalic atraumatic.  Sclerae nonicteric, noninjected.  Extraocular movements intact bilaterally. Neck:  Without mass or deformity.  Trachea is midline. Lungs:  Clear to auscultate bilaterally without rhonchi, wheeze, or rales. Heart:  Regular rate and rhythm.  Without murmurs, rubs, or gallops. Abdomen:  Soft, nontender, nondistended.  Without guarding or rebound. Extremities: Without cyanosis, clubbing, edema, or obvious deformity. Vascular:  Dorsalis pedis and posterior tibial pulses palpable bilaterally. Skin:  Warm and dry, no erythema, no ulcerations.  Data Reviewed: I have personally reviewed following labs and imaging studies  CBC: Recent Labs  Lab 09/04/19 1222 09/05/19 0730  WBC 6.0 3.8*  NEUTROABS 4.6 2.2  HGB 12.8 11.8*  HCT 39.9 36.6  MCV 90.5 90.1  PLT 230 [id number]   Basic Metabolic Panel: Recent Labs  Lab 09/04/19 1222 09/05/19 0730  NA 132*  --   K 3.3*  --   CL 96*  --   CO2 25  --   GLUCOSE 101*  --   BUN 12  --   CREATININE 0.70  --   CALCIUM 8.8*  --   MG  --  1.9  PHOS  --  4.0   GFR: Estimated Creatinine Clearance: 51.9 mL/min (by C-G formula based on SCr of 0.7 mg/dL).  Liver Function Tests: Recent Labs  Lab 09/04/19 1222  AST 22  ALT 12  ALKPHOS 76  BILITOT 0.4  PROT 7.0  ALBUMIN 4.0   Recent Labs  Lab 09/04/19 1222  LIPASE 32   Cardiac Enzymes: Recent Labs  Lab 09/04/19 1656  CKTOTAL 311*  CKMB 2.4   Anemia Panel: Recent Labs    09/04/19 1656 09/05/19 0730  FERRITIN 270 305   Sepsis Labs: Recent Labs  Lab 09/04/19 1222 09/04/19 1658  PROCALCITON  --  <0.10  LATICACIDVEN 1.1  --     Recent Results (from the past 240 hour(s))  Blood Culture (routine x 2)     Status: None (Preliminary result)   Collection Time: 09/04/19 12:22 PM   Specimen: BLOOD  Result Value Ref Range Status   Specimen Description   Final     BLOOD RIGHT ANTECUBITAL Performed at [facility], [place] [address], [place], [place] [address]    Special Requests   Final    BOTTLES DRAWN AEROBIC AND ANAEROBIC Blood Culture adequate volume Performed at [facility] [address], [place], [place] [address]    Culture   Final    NO GROWTH < 24 HOURS Performed at [facility] [address] [place], [place] [address]    Report Status PENDING  Incomplete  Urine culture     Status: None   Collection Time: 09/04/19 12:22 PM   Specimen: In/Out Cath Urine  Result Value Ref Range Status   Specimen Description   Final    IN/OUT CATH URINE Performed at [facility] [address] [place], [place] [address]    Special Requests   Final    NONE Performed at  [facility], [place] [address], [place], [place] [address]    Culture   Final    NO GROWTH Performed at [facility], [place] [address], [place], [place] [address]    Report Status 09/05/2019 FINAL  Final  Blood Culture (routine x 2)     Status: None (Preliminary result)   Collection Time: 09/04/19  8:00 PM   Specimen: BLOOD LEFT HAND  Result Value Ref Range Status   Specimen Description   Final    BLOOD LEFT HAND Performed at [facility] [address] [place], [place] [address]    Special Requests   Final    BOTTLES DRAWN AEROBIC AND ANAEROBIC Blood Culture results may not be optimal due to an inadequate volume of blood received in culture bottles Performed at [facility] [address], [place], [place] [address]    Culture   Final    NO GROWTH < 12 HOURS Performed at [facility] [address] [place], [place] [address]    Report Status PENDING  Incomplete    Radiology Studies: Ct Head Wo Contrast  Result Date: 09/04/2019 CLINICAL DATA:  Head trauma, on anticoagulation EXAM: CT HEAD WITHOUT CONTRAST TECHNIQUE: Contiguous axial  images were obtained from the base of the skull through the vertex without intravenous contrast. COMPARISON:  June 18, 2019 FINDINGS: Brain: There is no acute intracranial hemorrhage, mass-effect, or edema. There is no new loss of gray-white differentiation. 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 Additional confluent hypoattenuation in the supratentorial white matter likely reflects stable advanced chronic microvascular ischemic changes. Prominence of the ventricles and sulci reflects stable parenchymal volume loss. There is stable disproportionate ventricular prominence and communicating (normal pressure) hydrocephalus is not excluded. There is no extra-axial fluid collection. Vascular: There is atherosclerotic calcification at the skull base. Skull: Calvarium is unremarkable. Sinuses/Orbits: No acute finding. Other: None. IMPRESSION: No evidence of acute intracranial injury. Stable chronic findings detailed above. Electronically Signed   By: Macy Mis M.D.   On: 09/04/2019 14:56   Ct Angio Chest Pe W And/or Wo Contrast  Result Date: 09/04/2019 CLINICAL DATA:  Chest pain and abdominal pain. Generalized weakness. COVID-19 positive. EXAM: CT ANGIOGRAPHY CHEST CT ABDOMEN AND PELVIS WITH CONTRAST TECHNIQUE: Multidetector CT imaging of the chest was performed using the standard protocol during bolus administration of intravenous contrast. Multiplanar CT image reconstructions and MIPs were obtained to evaluate the vascular anatomy. Multidetector CT imaging of the abdomen and pelvis was performed using the standard protocol during bolus administration of intravenous contrast. CONTRAST:  18mL OMNIPAQUE IOHEXOL 350 MG/ML SOLN COMPARISON:  None. FINDINGS: CTA CHEST FINDINGS Cardiovascular: The heart is normal in size. No pericardial effusion. There is tortuosity, ectasia and moderate  atherosclerotic calcification of the thoracic aorta but no dissection. 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 The pulmonary arterial tree is well opacified. No filling defects to suggest pulmonary embolism. Mediastinum/Nodes: Small scattered mediastinal and hilar lymph nodes but no mass or overt adenopathy. None of these measures more than 8 mm. The esophagus is grossly normal. Lungs/Pleura: Advanced emphysematous changes are noted along with pulmonary scarring. There are patchy bilateral peripheral ground-glass type infiltrates and interstitial thickening consistent with atypical viral pneumonia such as COVID-19. No focal airspace consolidation or pleural effusion. No worrisome pulmonary lesions. Musculoskeletal: No breast masses, supraclavicular  or axillary adenopathy. The thyroid gland is grossly normal. The bony thorax is intact. No worrisome bone lesions. Moderate osteoporosis is noted and there are remote healed rib fractures noted. Review of the MIP images confirms the above findings. CT ABDOMEN and PELVIS FINDINGS Hepatobiliary: No focal hepatic lesions or intrahepatic biliary dilatation. The gallbladder is normal. No common bile duct dilatation. Pancreas: No mass, inflammation or ductal dilatation. Spleen: Normal size.  No focal lesions. Adrenals/Urinary Tract: Adrenal glands and kidneys are unremarkable. No worrisome renal lesions or hydronephrosis. Stomach/Bowel: The stomach, duodenum, small bowel and colon are grossly normal without oral contrast. No acute inflammatory changes, mass lesions or obstructive findings. Benign-appearing lipoma is noted in the descending duodenum. There is fairly significant diverticulosis involving the descending colon and sigmoid colon but no findings for acute diverticulitis. Vascular/Lymphatic: Advanced atherosclerotic calcifications involving the aorta and iliac arteries and major branch vessel ostia. The major  venous structures are patent. No mesenteric or retroperitoneal mass or lymphadenopathy the. Reproductive: Surgically absent. Other: Evidence of pelvic floor relaxation. There is a moderate cystocele and possible vaginal prolapse also. Musculoskeletal: No significant bony findings. Osteoporosis is noted but no fractures or bone lesions. Review of the MIP images confirms the above findings. IMPRESSION: 1. No CT findings for pulmonary embolism. 2. Tortuosity, ectasia and moderate atherosclerotic calcification involving the thoracic aorta. 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 Recommend annual imaging followup by CTA or MRA. This recommendation follows 2010 ACCF/AHA/AATS/ACR/ASA/SCA/SCAI/SIR/STS/SVM Guidelines for the Diagnosis and Management of Patients with Thoracic Aortic Disease. Circulation. 2010; [id number]. Aortic aneurysm NOS (ICD10-I71.9) 3. Patchy ground-glass infiltrates suggesting atypical viral pneumonia such as COVID-19. 4. Emphysematous changes and pulmonary scarring but no worrisome pulmonary lesions. 5. No acute abdominal/pelvic findings, mass lesions or lymphadenopathy. 6. Advanced atherosclerotic calcifications involving the aorta and iliac arteries and branch vessels but no aneurysm. 7. Evidence of pelvic floor laxity with cystocele and probable vaginal prolapse. Electronically Signed   By: Marijo Sanes M.D.   On: 09/04/2019 15:09   Ct Abdomen Pelvis W Contrast  Result Date: 09/04/2019 CLINICAL DATA:  Chest pain and abdominal pain. Generalized weakness. COVID-19 positive. EXAM: CT ANGIOGRAPHY CHEST CT ABDOMEN AND PELVIS WITH CONTRAST TECHNIQUE: Multidetector CT imaging of the chest was performed using the standard protocol during bolus administration of intravenous contrast. Multiplanar CT image reconstructions and MIPs were obtained to evaluate the vascular anatomy. Multidetector CT imaging of the abdomen and pelvis was performed  using the standard protocol during bolus administration of intravenous contrast. CONTRAST:  164mL OMNIPAQUE IOHEXOL 350 MG/ML SOLN COMPARISON:  None. FINDINGS: CTA CHEST FINDINGS Cardiovascular: The heart is normal in size. No pericardial effusion. There is tortuosity, ectasia and moderate atherosclerotic calcification of the thoracic aorta but no dissection. 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 The pulmonary arterial tree is well opacified. No filling defects to suggest pulmonary embolism. Mediastinum/Nodes: Small scattered mediastinal and hilar lymph nodes but no mass or overt adenopathy. None of these measures more than 8 mm. The esophagus is grossly normal. Lungs/Pleura: Advanced emphysematous changes are noted along with pulmonary scarring. There are patchy bilateral peripheral ground-glass type infiltrates and interstitial thickening consistent with atypical viral pneumonia such as COVID-19. No focal airspace consolidation or pleural effusion. No worrisome pulmonary lesions. Musculoskeletal: No breast masses, supraclavicular or axillary adenopathy. The thyroid gland is grossly normal. The bony thorax is intact. No worrisome bone lesions. Moderate osteoporosis is noted  and there are remote healed rib fractures noted. Review of the MIP images confirms the above findings. CT ABDOMEN and PELVIS FINDINGS Hepatobiliary: No focal hepatic lesions or intrahepatic biliary dilatation. The gallbladder is normal. No common bile duct dilatation. Pancreas: No mass, inflammation or ductal dilatation. Spleen: Normal size.  No focal lesions. Adrenals/Urinary Tract: Adrenal glands and kidneys are unremarkable. No worrisome renal lesions or hydronephrosis. Stomach/Bowel: The stomach, duodenum, small bowel and colon are grossly normal without oral contrast. No acute inflammatory changes, mass lesions or obstructive findings. Benign-appearing lipoma is noted in the  descending duodenum. There is fairly significant diverticulosis involving the descending colon and sigmoid colon but no findings for acute diverticulitis. Vascular/Lymphatic: Advanced atherosclerotic calcifications involving the aorta and iliac arteries and major branch vessel ostia. The major venous structures are patent. No mesenteric or retroperitoneal mass or lymphadenopathy the. Reproductive: Surgically absent. Other: Evidence of pelvic floor relaxation. There is a moderate cystocele and possible vaginal prolapse also. Musculoskeletal: No significant bony findings. Osteoporosis is noted but no fractures or bone lesions. Review of the MIP images confirms the above findings. IMPRESSION: 1. No CT findings for pulmonary embolism. 2. Tortuosity, ectasia and moderate atherosclerotic calcification involving the thoracic aorta. 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 Recommend annual imaging followup by CTA or MRA. This recommendation follows 2010 ACCF/AHA/AATS/ACR/ASA/SCA/SCAI/SIR/STS/SVM Guidelines for the Diagnosis and Management of Patients with Thoracic Aortic Disease. Circulation. 2010; [id number]. Aortic aneurysm NOS (ICD10-I71.9) 3. Patchy ground-glass infiltrates suggesting atypical viral pneumonia such as COVID-19. 4. Emphysematous changes and pulmonary scarring but no worrisome pulmonary lesions. 5. No acute abdominal/pelvic findings, mass lesions or lymphadenopathy. 6. Advanced atherosclerotic calcifications involving the aorta and iliac arteries and branch vessels but no aneurysm. 7. Evidence of pelvic floor laxity with cystocele and probable vaginal prolapse. Electronically Signed   By: Marijo Sanes M.D.   On: 09/04/2019 15:09   Dg Chest Port 1 View  Result Date: 09/04/2019 CLINICAL DATA:  Left-sided chest pain, back pain EXAM: PORTABLE CHEST 1 VIEW COMPARISON:  06/24/2019 FINDINGS: There is no focal consolidation. There is no pleural  effusion or pneumothorax. The heart size is enlarged. There is no acute osseous abnormality. IMPRESSION: No active disease. Electronically Signed   By: Kathreen Devoid   On: 09/04/2019 12:37   Scheduled Meds:  apixaban  5 mg Oral BID   dexamethasone (DECADRON) injection  6 mg Intravenous Daily   donepezil  5 mg Oral QHS   Ipratropium-Albuterol  1 puff Inhalation Q6H   levETIRAcetam  500 mg Oral BID   Melatonin  3 mg Oral QHS   metoprolol tartrate  50 mg Oral BID   multivitamin with minerals  1 tablet Oral Daily   senna  1 tablet Oral BID   vitamin C  500 mg Oral Daily   zinc sulfate  220 mg Oral Daily   Continuous Infusions:  0.9 % NaCl with KCl 20 mEq / L 50 mL/hr at 09/05/19 0113   cefTRIAXone (ROCEPHIN)  IV Stopped (09/05/19 0030)   remdesivir 100 mg in NS 250 mL     Followed by   Derrill Memo ON 09/06/2019] remdesivir 100 mg in NS 250 mL       LOS: 1 day   Time spent: 79min  Chrisangel Eskenazi C Trayven Lumadue, DO [facility]  If 7PM-7AM, please contact night-coverage [email or web address] Password [email or web address] 09/05/2019, 3:30 PM

## 2019-09-06 LAB — CBC WITH DIFFERENTIAL/PLATELET
Abs Immature Granulocytes: 0.04 10*3/uL (ref 0.00–0.07)
Basophils Absolute: 0 10*3/uL (ref 0.0–0.1)
Basophils Relative: 0 %
Eosinophils Absolute: 0 10*3/uL (ref 0.0–0.5)
Eosinophils Relative: 0 %
HCT: 38.1 % (ref 36.0–46.0)
Hemoglobin: 12.4 g/dL (ref 12.0–15.0)
Immature Granulocytes: 1 %
Lymphocytes Relative: 21 %
Lymphs Abs: 1.7 10*3/uL (ref 0.7–4.0)
MCH: 28.8 pg (ref 26.0–34.0)
MCHC: 32.5 g/dL (ref 30.0–36.0)
MCV: 88.4 fL (ref 80.0–100.0)
Monocytes Absolute: 0.7 10*3/uL (ref 0.1–1.0)
Monocytes Relative: 9 %
Neutro Abs: 5.7 10*3/uL (ref 1.7–7.7)
Neutrophils Relative %: 69 %
Platelets: 279 10*3/uL (ref 150–400)
RBC: 4.31 MIL/uL (ref 3.87–5.11)
RDW: 14.1 % (ref 11.5–15.5)
WBC: 8.2 10*3/uL (ref 4.0–10.5)
nRBC: 0 % (ref 0.0–0.2)

## 2019-09-06 LAB — COMPREHENSIVE METABOLIC PANEL
ALT: 12 U/L (ref 0–44)
AST: 22 U/L (ref 15–41)
Albumin: 3.2 g/dL — ABNORMAL LOW (ref 3.5–5.0)
Alkaline Phosphatase: 63 U/L (ref 38–126)
Anion gap: 9 (ref 5–15)
BUN: 21 mg/dL (ref 8–23)
CO2: 20 mmol/L — ABNORMAL LOW (ref 22–32)
Calcium: 9 mg/dL (ref 8.9–10.3)
Chloride: 106 mmol/L (ref 98–111)
Creatinine, Ser: 0.6 mg/dL (ref 0.44–1.00)
GFR calc Af Amer: >60 mL/min (ref >60)
GFR calc non Af Amer: >60 mL/min (ref >60)
Glucose, Bld: 120 mg/dL — ABNORMAL HIGH (ref 70–99)
Potassium: 4.6 mmol/L (ref 3.5–5.1)
Sodium: 135 mmol/L (ref 135–145)
Total Bilirubin: 0.1 mg/dL — ABNORMAL LOW (ref 0.3–1.2)
Total Protein: 6 g/dL — ABNORMAL LOW (ref 6.5–8.1)

## 2019-09-06 LAB — FERRITIN: Ferritin: 426 ng/mL — ABNORMAL HIGH (ref 11–307)

## 2019-09-06 LAB — C-REACTIVE PROTEIN: CRP: 6.9 mg/dL — ABNORMAL HIGH (ref <1.0)

## 2019-09-06 LAB — PHOSPHORUS: Phosphorus: 2.4 mg/dL — ABNORMAL LOW (ref 2.5–4.6)

## 2019-09-06 LAB — D-DIMER, QUANTITATIVE: D-Dimer, Quant: 0.79 ug/mL-FEU — ABNORMAL HIGH (ref 0.00–0.50)

## 2019-09-06 LAB — MAGNESIUM: Magnesium: 1.7 mg/dL (ref 1.7–2.4)

## 2019-09-06 MED ORDER — METOPROLOL TARTRATE 5 MG/5ML IV SOLN: 2.5000 mg | Freq: Four times a day (QID) | INTRAVENOUS | Status: DC | PRN

## 2019-09-06 NOTE — Progress Notes (Signed)
Page sent to MD Harlow Asa to inform about pt HR 120-140's.

## 2019-09-06 NOTE — Plan of Care (Signed)
Pt confused at times, will continue to reorient as needed

## 2019-09-06 NOTE — Progress Notes (Signed)
PROGRESS NOTE    Makenya Sferrazza  [id number] DOB: 1941/07/08 DOA: 09/04/2019 PCP: Patient, No Pcp Per   Brief Narrative:  Halle Hopps is a 78 y.o. female with history of paroxysmal A. fib, seizure disorder, hyponatremia, hypertension, dementia and hyperlipidemia presenting from ALF on 09/04/2019 with the above chief complaints.  Poor historian at [facility] given dementia. Per ED provider, patient reported abdominal pain, back pain and fever.  She reported dizziness with standing, and fall at facility but facility didn't witness fall.  She was a started on Bactrim for presumed UTI at facility this morning. In ED, hemodynamically stable.  Febrile to 100.4.  Desaturated to 84% on room air.  Started on 2 L by nasal cannula and saturation improved to upper 90s on 100.  Sodium 132.  Potassium 3.3.  Otherwise, CBC and CMP not impressive.  Lactic acid negative.  UA with large LE and rare bacteria.  COVID-19 swab positive.  EKG sinus rhythm with occasional PACs but no acute ischemic finding or abnormal interval.  CTA chest negative for PE but groundglass opacities concerning for COVID-19 infection, emphysema and ascending thoracic aortic aneurysm.  No acute or significant finding within the abdomen or pelvis other than possible vaginal prolapse/cystocele.  CT head without acute finding.  Hospitalist was paged for admission for COVID-19 infection and possible UTI.   Assessment & Plan:   Principal Problem:   Hyponatremia Active Problems:   AKI (acute kidney injury) ([place])   Dementia (HCC)   Essential hypertension   Hyperlipidemia   Acute hypoxemic respiratory failure due to COVID-19 ([place])    Acute hypoxic respiratory failure due to COVID-19 infection/pneumonia:  -CTA chest with groundglass opacities.  SpO2: 100 % O2 Flow Rate (L/min): 2 L/min -Continue dexamethasone.  Remdesivir per pharmacy -SABA/SAMA, mucolytic's and vitamins Recent Labs    09/04/19 1656 09/04/19 1658 09/05/19 0730  09/06/19 0055  DDIMER  --  0.98* 0.68* 0.79*  FERRITIN 270  --  305 426*  LDH  --  174  --   --   CRP 7.4*  --  10.0* 6.9*    Paroxysmal atrial fibrillation, currently rate controlled: -In sinus rhythm now.  CHA2DS-2-VASc score greater than 4. -Continue home metoprolol at 50 mg twice daily -Continue home Eliquis.  Questionable urinary tract infection, POA, resolving:  -Reported new urinary incontinence from facility -UA with large LE and rare bacteria -Follow urine cultures - no growth to date -Ceftriaxone completed 09/06/19  Dementia, at baseline:  -Oriented to self, general location(city state), month and name - currently at baseline -Continue home Aricept -Frequent orientation required; continue delirium precautions  Hyponatremia/hypokalemia, stable:  -Started on Bactrim prior to admission - now discontinued  History of seizure -Continue home Keppra  DVT prophylaxis: On Eliquis for A. fib           Code Status: Full code-discussed at admission with patient, daughter and daughter-in-law.  Family Communication: Daughter and daughter-in-law as above. Disposition Plan: Pending clinical course Consults called: None Admission status: Inpatient  Subjective: No acute issues or events overnight, its ongoing shortness of breath, somewhat poor historian.  Otherwise denies chest pain, nausea, vomiting, diarrhea, constipation, headache, fevers, chills.  Objective: Vitals:   09/06/19 0100 09/06/19 0300 09/06/19 0800 09/06/19 0931  BP:  (!) 145/101 (!) 145/98 139/90  Pulse: 94 (!) 146 89 (!) 111  Resp: 20 16 20    Temp:  (!) 97.4 F (36.3 C) 97.7 F (36.5 C)   TempSrc:  Oral Oral   SpO2: 92% 93%  100%   Weight:      Height:        Intake/Output Summary (Last 24 hours) at 09/06/2019 1525 Last data filed at 09/06/2019 1300 Gross per 24 hour  Intake 1529.03 ml  Output -  Net 1529.03 ml   Filed Weights   09/04/19 2214 09/05/19 0100  Weight: 55.3 kg 55.8 kg     Examination:  General:  Pleasantly resting in bed, No acute distress. HEENT:  Normocephalic atraumatic.  Sclerae nonicteric, noninjected.  Extraocular movements intact bilaterally. Neck:  Without mass or deformity.  Trachea is midline. Lungs:  Clear to auscultate bilaterally without rhonchi, wheeze, or rales. Heart:  Regular rate and rhythm.  Without murmurs, rubs, or gallops. Abdomen:  Soft, nontender, nondistended.  Without guarding or rebound. Extremities: Without cyanosis, clubbing, edema, or obvious deformity. Vascular:  Dorsalis pedis and posterior tibial pulses palpable bilaterally. Skin:  Warm and dry, no erythema, no ulcerations.  Data Reviewed: I have personally reviewed following labs and imaging studies  CBC: Recent Labs  Lab 09/04/19 1222 09/05/19 0730 09/06/19 0055  WBC 6.0 3.8* 8.2  NEUTROABS 4.6 2.2 5.7  HGB 12.8 11.8* 12.4  HCT 39.9 36.6 38.1  MCV 90.5 90.1 88.4  PLT 230 241 [id number]   Basic Metabolic Panel: Recent Labs  Lab 09/04/19 1222 09/05/19 0730 09/06/19 0055  NA 132*  --  135  K 3.3*  --  4.6  CL 96*  --  106  CO2 25  --  20*  GLUCOSE 101*  --  120*  BUN 12  --  21  CREATININE 0.70  --  0.60  CALCIUM 8.8*  --  9.0  MG  --  1.9 1.7  PHOS  --  4.0 2.4*   GFR: Estimated Creatinine Clearance: 51.9 mL/min (by C-G formula based on SCr of 0.6 mg/dL).  Liver Function Tests: Recent Labs  Lab 09/04/19 1222 09/06/19 0055  AST 22 22  ALT 12 12  ALKPHOS 76 63  BILITOT 0.4 0.1*  PROT 7.0 6.0*  ALBUMIN 4.0 3.2*   Recent Labs  Lab 09/04/19 1222  LIPASE 32   Cardiac Enzymes: Recent Labs  Lab 09/04/19 1656  CKTOTAL 311*  CKMB 2.4   Anemia Panel: Recent Labs    09/05/19 0730 09/06/19 0055  FERRITIN 305 426*   Sepsis Labs: Recent Labs  Lab 09/04/19 1222 09/04/19 1658  PROCALCITON  --  <0.10  LATICACIDVEN 1.1  --     Recent Results (from the past 240 hour(s))  Blood Culture (routine x 2)     Status: None (Preliminary result)    Collection Time: 09/04/19 12:22 PM   Specimen: BLOOD  Result Value Ref Range Status   Specimen Description   Final    BLOOD RIGHT ANTECUBITAL Performed at [facility], [place] [address], [place], [place] [address]    Special Requests   Final    BOTTLES DRAWN AEROBIC AND ANAEROBIC Blood Culture adequate volume Performed at [facility] [address], [place], [place] [address]    Culture   Final    NO GROWTH 2 DAYS Performed at [facility] [address] [place],  [address]    Report Status PENDING  Incomplete  Urine culture     Status: None   Collection Time: 09/04/19 12:22 PM   Specimen: In/Out Cath Urine  Result Value Ref Range Status   Specimen Description   Final    IN/OUT CATH URINE Performed at [facility]  [place] [address], [place], [place] [address]    Special Requests   Final    NONE Performed at [facility], [place] [address] [place], [place] [address]    Culture   Final    NO GROWTH Performed at [facility], [place] [address] [place], [place] [address]    Report Status 09/05/2019 FINAL  Final  Blood Culture (routine x 2)     Status: None (Preliminary result)   Collection Time: 09/04/19  8:00 PM   Specimen: BLOOD LEFT HAND  Result Value Ref Range Status   Specimen Description   Final    BLOOD LEFT HAND Performed at [facility] [address] [place], [place] [address]    Special Requests   Final    BOTTLES DRAWN AEROBIC AND ANAEROBIC Blood Culture results may not be optimal due to an inadequate volume of blood received in culture bottles Performed at [facility] [address], [place], [place] [address]    Culture   Final    NO GROWTH 2 DAYS Performed at [facility] [address] [place],  [address]    Report Status PENDING  Incomplete    Radiology Studies: No results found. Scheduled Meds: .  apixaban  5 mg Oral BID  . dexamethasone (DECADRON) injection  6 mg Intravenous Daily  . donepezil  5 mg Oral QHS  . Ipratropium-Albuterol  1 puff Inhalation Q6H  . levETIRAcetam  500 mg Oral BID  . Melatonin  3 mg Oral QHS  . metoprolol tartrate  50 mg Oral BID  . multivitamin with minerals  1 tablet Oral Daily  . senna  1 tablet Oral BID  . vitamin C  500 mg Oral Daily  . zinc sulfate  220 mg Oral Daily   Continuous Infusions: . 0.9 % NaCl with KCl 20 mEq / L 50 mL/hr at 09/06/19 1300  . cefTRIAXone (ROCEPHIN)  IV 1 g (09/05/19 2028)  . remdesivir 100 mg in NS 250 mL Stopped (09/06/19 1048)     LOS: 2 days   Time spent: 82min  William C Lancaster, DO [facility]  If 7PM-7AM, please contact night-coverage [email or web address] Password [facility] 09/06/2019, 3:25 PM

## 2019-09-07 LAB — COMPREHENSIVE METABOLIC PANEL
ALT: 11 U/L (ref 0–44)
AST: 19 U/L (ref 15–41)
Albumin: 3 g/dL — ABNORMAL LOW (ref 3.5–5.0)
Alkaline Phosphatase: 61 U/L (ref 38–126)
Anion gap: 10 (ref 5–15)
BUN: 20 mg/dL (ref 8–23)
CO2: 22 mmol/L (ref 22–32)
Calcium: 8.9 mg/dL (ref 8.9–10.3)
Chloride: 103 mmol/L (ref 98–111)
Creatinine, Ser: 0.53 mg/dL (ref 0.44–1.00)
GFR calc Af Amer: >60 mL/min (ref >60)
GFR calc non Af Amer: >60 mL/min (ref >60)
Glucose, Bld: 99 mg/dL (ref 70–99)
Potassium: 3.8 mmol/L (ref 3.5–5.1)
Sodium: 135 mmol/L (ref 135–145)
Total Bilirubin: 0.2 mg/dL — ABNORMAL LOW (ref 0.3–1.2)
Total Protein: 5.5 g/dL — ABNORMAL LOW (ref 6.5–8.1)

## 2019-09-07 LAB — CBC WITH DIFFERENTIAL/PLATELET
Abs Immature Granulocytes: 0.04 10*3/uL (ref 0.00–0.07)
Basophils Absolute: 0 10*3/uL (ref 0.0–0.1)
Basophils Relative: 0 %
Eosinophils Absolute: 0 10*3/uL (ref 0.0–0.5)
Eosinophils Relative: 0 %
HCT: 38.7 % (ref 36.0–46.0)
Hemoglobin: 12.7 g/dL (ref 12.0–15.0)
Immature Granulocytes: 1 %
Lymphocytes Relative: 23 %
Lymphs Abs: 2 10*3/uL (ref 0.7–4.0)
MCH: 28.7 pg (ref 26.0–34.0)
MCHC: 32.8 g/dL (ref 30.0–36.0)
MCV: 87.4 fL (ref 80.0–100.0)
Monocytes Absolute: 1.1 10*3/uL — ABNORMAL HIGH (ref 0.1–1.0)
Monocytes Relative: 12 %
Neutro Abs: 5.5 10*3/uL (ref 1.7–7.7)
Neutrophils Relative %: 64 %
Platelets: 304 10*3/uL (ref 150–400)
RBC: 4.43 MIL/uL (ref 3.87–5.11)
RDW: 14.4 % (ref 11.5–15.5)
WBC: 8.6 10*3/uL (ref 4.0–10.5)
nRBC: 0 % (ref 0.0–0.2)

## 2019-09-07 LAB — MAGNESIUM: Magnesium: 1.8 mg/dL (ref 1.7–2.4)

## 2019-09-07 LAB — PHOSPHORUS: Phosphorus: 2.9 mg/dL (ref 2.5–4.6)

## 2019-09-07 LAB — D-DIMER, QUANTITATIVE: D-Dimer, Quant: 0.62 ug/mL-FEU — ABNORMAL HIGH (ref 0.00–0.50)

## 2019-09-07 LAB — C-REACTIVE PROTEIN: CRP: 3.7 mg/dL — ABNORMAL HIGH (ref <1.0)

## 2019-09-07 LAB — FERRITIN: Ferritin: 363 ng/mL — ABNORMAL HIGH (ref 11–307)

## 2019-09-07 NOTE — Progress Notes (Signed)
PROGRESS NOTE    Kendra Charles  [id number] DOB: January 09, 1941 DOA: 09/04/2019 PCP: Patient, No Pcp Per   Brief Narrative:  Kendra Charles is a 78 y.o. female with history of paroxysmal A. fib, seizure disorder, hyponatremia, hypertension, dementia and hyperlipidemia presenting from ALF on 09/04/2019 with the above chief complaints.  Poor historian at [facility] given dementia. Per ED provider, patient reported abdominal pain, back pain and fever.  She reported dizziness with standing, and fall at facility but facility didn't witness fall.  She was a started on Bactrim for presumed UTI at facility this morning. In ED, hemodynamically stable.  Febrile to 100.4.  Desaturated to 84% on room air.  Started on 2 L by nasal cannula and saturation improved to upper 90s on 100.  Sodium 132.  Potassium 3.3.  Otherwise, CBC and CMP not impressive.  Lactic acid negative.  UA with large LE and rare bacteria.  COVID-19 swab positive.  EKG sinus rhythm with occasional PACs but no acute ischemic finding or abnormal interval.  CTA chest negative for PE but groundglass opacities concerning for COVID-19 infection, emphysema and ascending thoracic aortic aneurysm.  No acute or significant finding within the abdomen or pelvis other than possible vaginal prolapse/cystocele.  CT head without acute finding.  Hospitalist was paged for admission for COVID-19 infection and possible UTI.   Assessment & Plan:   Principal Problem:   Hyponatremia Active Problems:   AKI (acute kidney injury) ([place])   Dementia (HCC)   Essential hypertension   Hyperlipidemia   Acute hypoxemic respiratory failure due to COVID-19 ([place])   Acute hypoxic respiratory failure due to COVID-19 infection/pneumonia:  -CTA chest with groundglass opacities.  -Pt on RA now - poorly ambulatory but will attempt oxygen screen -Continue dexamethasone.  Remdesivir per pharmacy - stop date 09/08/19 -SABA/SAMA, mucolytic's and vitamins Recent Labs    09/04/19  1658 09/05/19 0730 09/06/19 0055 09/07/19 0330  DDIMER 0.98* 0.68* 0.79* 0.62*  FERRITIN  --  305 426* 363*  LDH 174  --   --   --   CRP  --  10.0* 6.9* 3.7*    Paroxysmal atrial fibrillation, currently rate controlled: -CHA2DS-2-VASc score greater than 4. -Continue home metoprolol at 50 mg twice daily; mild bradycardia overnight - asymptomatic -Continue home Eliquis.  Questionable urinary tract infection, POA, resolving:  -Reported new urinary incontinence from facility -UA with large LE and rare bacteria -Follow urine cultures - no growth to date -Ceftriaxone completed 09/06/19  Dementia, at baseline:  -Oriented to self, general location(city state), month and name - currently at baseline -Continue home Aricept -Frequent orientation required; continue delirium precautions  Hyponatremia/hypokalemia, stable:  -Started on Bactrim prior to admission - now discontinued  History of seizure -Continue home Keppra  DVT prophylaxis: On Eliquis for A. fib           Code Status: Full code-discussed at admission with patient, daughter and daughter-in-law.  Family Communication: Daughter and daughter-in-law as above. Disposition Plan: Pending clinical course Consults called: None Admission status: Inpatient - likely discharge back to ALF in the next 24-48h pending clinical course.  Subjective: No acute issues or events overnight, its ongoing shortness of breath, somewhat poor historian.  Otherwise denies chest pain, nausea, vomiting, diarrhea, constipation, headache, fevers, chills.  Objective: Vitals:   09/07/19 0300 09/07/19 0400 09/07/19 0500 09/07/19 0600  BP:  97/84    Pulse: 67 71 (!) 49 (!) 43  Resp: 18 (!) 21 15 20   Temp:  TempSrc:      SpO2: 93% 92% 95% 93%  Weight:      Height:        Intake/Output Summary (Last 24 hours) at 09/07/2019 0816 Last data filed at 09/06/2019 2300 Gross per 24 hour  Intake 1638.65 ml  Output -  Net 1638.65 ml   Filed  Weights   09/04/19 2214 09/05/19 0100  Weight: 55.3 kg 55.8 kg    Examination:  General:  Pleasantly resting in bed, No acute distress. HEENT:  Normocephalic atraumatic.  Sclerae nonicteric, noninjected.  Extraocular movements intact bilaterally. Neck:  Without mass or deformity.  Trachea is midline. Lungs:  Clear to auscultate bilaterally without rhonchi, wheeze, or rales. Heart:  Regular rate and rhythm.  Without murmurs, rubs, or gallops. Abdomen:  Soft, nontender, nondistended.  Without guarding or rebound. Extremities: Without cyanosis, clubbing, edema, or obvious deformity. Vascular:  Dorsalis pedis and posterior tibial pulses palpable bilaterally. Skin:  Warm and dry, no erythema, no ulcerations.  Data Reviewed: I have personally reviewed following labs and imaging studies  CBC: Recent Labs  Lab 09/04/19 1222 09/05/19 0730 09/06/19 0055 09/07/19 0330  WBC 6.0 3.8* 8.2 8.6  NEUTROABS 4.6 2.2 5.7 5.5  HGB 12.8 11.8* 12.4 12.7  HCT 39.9 36.6 38.1 38.7  MCV 90.5 90.1 88.4 87.4  PLT 230 241 279 [id number]   Basic Metabolic Panel: Recent Labs  Lab 09/04/19 1222 09/05/19 0730 09/06/19 0055 09/07/19 0330  NA 132*  --  135 135  K 3.3*  --  4.6 3.8  CL 96*  --  106 103  CO2 25  --  20* 22  GLUCOSE 101*  --  120* 99  BUN 12  --  21 20  CREATININE 0.70  --  0.60 0.53  CALCIUM 8.8*  --  9.0 8.9  MG  --  1.9 1.7 1.8  PHOS  --  4.0 2.4* 2.9   GFR: Estimated Creatinine Clearance: 51.9 mL/min (by C-G formula based on SCr of 0.53 mg/dL).  Liver Function Tests: Recent Labs  Lab 09/04/19 1222 09/06/19 0055 09/07/19 0330  AST 22 22 19   ALT 12 12 11   ALKPHOS 76 63 61  BILITOT 0.4 0.1* 0.2*  PROT 7.0 6.0* 5.5*  ALBUMIN 4.0 3.2* 3.0*   Recent Labs  Lab 09/04/19 1222  LIPASE 32   Cardiac Enzymes: Recent Labs  Lab 09/04/19 1656  CKTOTAL 311*  CKMB 2.4   Anemia Panel: Recent Labs    09/06/19 0055 09/07/19 0330  FERRITIN 426* 363*   Sepsis Labs: Recent Labs   Lab 09/04/19 1222 09/04/19 1658  PROCALCITON  --  <0.10  LATICACIDVEN 1.1  --     Recent Results (from the past 240 hour(s))  Blood Culture (routine x 2)     Status: None (Preliminary result)   Collection Time: 09/04/19 12:22 PM   Specimen: BLOOD  Result Value Ref Range Status   Specimen Description   Final    BLOOD RIGHT ANTECUBITAL Performed at [facility], [place] [address] [place], [place] [address]    Special Requests   Final    BOTTLES DRAWN AEROBIC AND ANAEROBIC Blood Culture adequate volume Performed at [place] [address] [place], [place] [address]    Culture   Final    NO GROWTH 3 DAYS Performed at [facility], [place] [address] [place], [place] [address]    Report Status PENDING  Incomplete  Urine culture  Status: None   Collection Time: 09/04/19 12:22 PM   Specimen: In/Out Cath Urine  Result Value Ref Range Status   Specimen Description   Final    IN/OUT CATH URINE Performed at [facility], [place] [address] [place], [place] [address]    Special Requests   Final    NONE Performed at [facility], [place] [address], [place], [place] [address]    Culture   Final    NO GROWTH Performed at [facility], [place] [address] [place], [place] [address]    Report Status 09/05/2019 FINAL  Final  Blood Culture (routine x 2)     Status: None (Preliminary result)   Collection Time: 09/04/19  8:00 PM   Specimen: BLOOD LEFT HAND  Result Value Ref Range Status   Specimen Description   Final    BLOOD LEFT HAND Performed at [facility] [address] [place], [place] [address]    Special Requests   Final    BOTTLES DRAWN AEROBIC AND ANAEROBIC Blood Culture results may not be optimal due to an inadequate volume of blood received in culture bottles Performed at [place] [address], [place], [place] [address]    Culture   Final     NO GROWTH 3 DAYS Performed at [facility], [place] [address] [place], [place] [address]    Report Status PENDING  Incomplete    Radiology Studies: No results found. Scheduled Meds: . apixaban  5 mg Oral BID  . dexamethasone (DECADRON) injection  6 mg Intravenous Daily  . donepezil  5 mg Oral QHS  . Ipratropium-Albuterol  1 puff Inhalation Q6H  . levETIRAcetam  500 mg Oral BID  . Melatonin  3 mg Oral QHS  . metoprolol tartrate  50 mg Oral BID  . multivitamin with minerals  1 tablet Oral Daily  . senna  1 tablet Oral BID  . vitamin C  500 mg Oral Daily  . zinc sulfate  220 mg Oral Daily   Continuous Infusions: . cefTRIAXone (ROCEPHIN)  IV 1 g (09/05/19 2028)  . remdesivir 100 mg in NS 250 mL Stopped (09/06/19 1048)     LOS: 3 days   Time spent: 32min  Julious Langlois C Darlette Dubow, DO [facility]  If 7PM-7AM, please contact night-coverage [email or web address] Password [email or web address] 09/07/2019, 8:16 AM

## 2019-09-08 LAB — COMPREHENSIVE METABOLIC PANEL
ALT: 16 U/L (ref 0–44)
AST: 25 U/L (ref 15–41)
Albumin: 3 g/dL — ABNORMAL LOW (ref 3.5–5.0)
Alkaline Phosphatase: 62 U/L (ref 38–126)
Anion gap: 9 (ref 5–15)
BUN: 19 mg/dL (ref 8–23)
CO2: 23 mmol/L (ref 22–32)
Calcium: 8.6 mg/dL — ABNORMAL LOW (ref 8.9–10.3)
Chloride: 102 mmol/L (ref 98–111)
Creatinine, Ser: <0.3 mg/dL — ABNORMAL LOW (ref 0.44–1.00)
Glucose, Bld: 117 mg/dL — ABNORMAL HIGH (ref 70–99)
Potassium: 3.7 mmol/L (ref 3.5–5.1)
Sodium: 134 mmol/L — ABNORMAL LOW (ref 135–145)
Total Bilirubin: 0.4 mg/dL (ref 0.3–1.2)
Total Protein: 5.5 g/dL — ABNORMAL LOW (ref 6.5–8.1)

## 2019-09-08 LAB — CBC WITH DIFFERENTIAL/PLATELET
Abs Immature Granulocytes: 0.05 10*3/uL (ref 0.00–0.07)
Basophils Absolute: 0 10*3/uL (ref 0.0–0.1)
Basophils Relative: 0 %
Eosinophils Absolute: 0 10*3/uL (ref 0.0–0.5)
Eosinophils Relative: 0 %
HCT: 36.7 % (ref 36.0–46.0)
Hemoglobin: 12.1 g/dL (ref 12.0–15.0)
Immature Granulocytes: 1 %
Lymphocytes Relative: 29 %
Lymphs Abs: 1.8 10*3/uL (ref 0.7–4.0)
MCH: 28.7 pg (ref 26.0–34.0)
MCHC: 33 g/dL (ref 30.0–36.0)
MCV: 87.2 fL (ref 80.0–100.0)
Monocytes Absolute: 0.5 10*3/uL (ref 0.1–1.0)
Monocytes Relative: 8 %
Neutro Abs: 3.8 10*3/uL (ref 1.7–7.7)
Neutrophils Relative %: 62 %
Platelets: 307 10*3/uL (ref 150–400)
RBC: 4.21 MIL/uL (ref 3.87–5.11)
RDW: 14.4 % (ref 11.5–15.5)
WBC: 6.1 10*3/uL (ref 4.0–10.5)
nRBC: 0 % (ref 0.0–0.2)

## 2019-09-08 LAB — C-REACTIVE PROTEIN: CRP: 3.9 mg/dL — ABNORMAL HIGH (ref <1.0)

## 2019-09-08 LAB — D-DIMER, QUANTITATIVE: D-Dimer, Quant: 0.63 ug/mL-FEU — ABNORMAL HIGH (ref 0.00–0.50)

## 2019-09-08 LAB — PHOSPHORUS: Phosphorus: 3.9 mg/dL (ref 2.5–4.6)

## 2019-09-08 LAB — FERRITIN: Ferritin: 323 ng/mL — ABNORMAL HIGH (ref 11–307)

## 2019-09-08 LAB — MAGNESIUM: Magnesium: 1.9 mg/dL (ref 1.7–2.4)

## 2019-09-08 MED ORDER — PREDNISONE 10 MG PO TABS: ORAL_TABLET | ORAL | 0 refills | Status: AC

## 2019-09-08 NOTE — Evaluation (Signed)
Physical Therapy Evaluation Patient Details Name: Kendra Charles MRN: [id number] DOB: [birth year] Today's Date: 09/08/2019   History of Present Illness  78 y.o. female with history of paroxysmal A. fib, seizure disorder, hyponatremia, hypertension, dementia and hyperlipidemia presenting from ALF on 09/04/2019 with the above chief complaints.  Poor historian at [facility] given dementia. Per ED provider, patient reported abdominal pain, back pain and fever.  She reported dizziness with standing, and fall at facility but facility didn't witness fall  Clinical Impression   Pt admitted with above diagnosis. Pt somewhat confused and fairly confused hx.  States that was living at ALF and was getting around with walker, when asked to describe said walker states she cant. When therapist attempted to describe a 4WW pt states she doesn't know if this was it. Pt currently with functional limitations due to the deficits listed below (see PT Problem List). Pt presents with decreased overall strength and independence, she was abel to complete transfers with min a and cues fo safe walker use, ambulated in room approx 58ft with RW and min guard assist. Pt was on room air throughout and managed to maintain sats in 90s. Pt will benefit from skilled PT to increase their independence and safety with mobility to allow discharge to the venue listed below.       Follow Up Recommendations Supervision for mobility/OOB;Other (comment)(back to ALF)    Equipment Recommendations  None recommended by PT    Recommendations for Other Services       Precautions / Restrictions Precautions Precautions: Fall Restrictions Weight Bearing Restrictions: No      Mobility  Bed Mobility Overal bed mobility: Needs Assistance                Transfers Overall transfer level: Needs assistance Equipment used: Rolling walker (2 wheeled) Transfers: Sit to/from Stand Sit to Stand: Min assist         General transfer  comment: sit<>stand from recliner and commode  Ambulation/Gait Ambulation/Gait assistance: Min guard;Supervision Gait Distance (Feet): 70 Feet Assistive device: Rolling walker (2 wheeled) Gait Pattern/deviations: Step-through pattern Gait velocity: decreased   General Gait Details: ambulated in room on room air and was able to maintain sats in 90s  Stairs            Wheelchair Mobility    Modified Rankin (Stroke Patients Only)       Balance                                             Pertinent Vitals/Pain Pain Assessment: No/denies pain    Home Living Family/patient expects to be discharged to:: Assisted living                      Prior Function Level of Independence: Needs assistance   Gait / Transfers Assistance Needed: walks with rollator and supervision  ADL's / Homemaking Assistance Needed: lives in ALF        Hand Dominance   Dominant Hand: Right    Extremity/Trunk Assessment   Upper Extremity Assessment Upper Extremity Assessment: Generalized weakness    Lower Extremity Assessment Lower Extremity Assessment: Generalized weakness       Communication   Communication: HOH  Cognition Arousal/Alertness: Awake/alert Behavior During Therapy: WFL for tasks assessed/performed Overall Cognitive Status: No family/caregiver present to determine baseline cognitive functioning  General Comments      Exercises     Assessment/Plan    PT Assessment Patient needs continued PT services  PT Problem List Decreased strength;Decreased activity tolerance;Decreased balance;Decreased coordination;Decreased safety awareness       PT Treatment Interventions Gait training;Functional mobility training;Therapeutic activities;Therapeutic exercise;Balance training;Neuromuscular re-education    PT Goals (Current goals can be found in the Care Plan section)  Acute Rehab PT  Goals Patient Stated Goal: to go home PT Goal Formulation: With patient Time For Goal Achievement: 09/22/19 Potential to Achieve Goals: Good    Frequency Min 3X/week   Barriers to discharge        Co-evaluation               AM-PAC PT "6 Clicks" Mobility  Outcome Measure Help needed turning from your back to your side while in a flat bed without using bedrails?: A Little Help needed moving from lying on your back to sitting on the side of a flat bed without using bedrails?: A Little Help needed moving to and from a bed to a chair (including a wheelchair)?: A Little Help needed standing up from a chair using your arms (e.g., wheelchair or bedside chair)?: A Lot Help needed to walk in hospital room?: A Lot Help needed climbing 3-5 steps with a railing? : A Lot 6 Click Score: 15    End of Session   Activity Tolerance: Patient tolerated treatment well Patient left: in chair;with call bell/phone within reach Nurse Communication: Mobility status PT Visit Diagnosis: Unsteadiness on feet (R26.81);Muscle weakness (generalized) (M62.81)    Time: [id number] PT Time Calculation (min) (ACUTE ONLY): 22 min   Charges:   PT Evaluation $PT Eval Moderate Complexity: 1 Mod PT Treatments $Therapeutic Activity: 8-22 mins        Horald Chestnut, PT   Delford Field 09/08/2019, 12:49 PM

## 2019-09-08 NOTE — TOC Transition Note (Signed)
Transition of Care [facility]) - CM/SW Discharge Note   Patient Details  Name: Kendra Charles MRN: [id number] Date of Birth: Aug 04, 1941  Transition of Care [facility] CM/SW Contact:  Amador Cunas, [place] Phone Number: 09/08/2019, 4:15 PM   Clinical Narrative:   Pt for d/c back to [facility] ALF today. Spoke to [place] at [facility] who confirmed they are able to accept pt COVID (+) and will quarantine. Notified pt's son, [name] [phone], who reports agreeable to d/c. RN to call report to [phone] (press [phone] and ask for [place]). DC summary faxed to facility [phone]. SW signing off at d/c.   Wandra Feinstein, MSW, LCSW [phone] (GV coverage)        Final next level of care: Assisted Living Barriers to Discharge: No Barriers Identified   Patient Goals and CMS Choice        Discharge Placement              Patient chooses bed at: [facility] Patient to be transferred to facility by: [facility] Name of family member notified: [name] Patient and family notified of of transfer: 09/08/19  Discharge Plan and Services                                     Social Determinants of Health (SDOH) Interventions     Readmission Risk Interventions Readmission Risk Prevention Plan 06/19/2019  Transportation Screening Complete  PCP or Specialist Appt within 5-7 Days Complete  Home Care Screening Complete  Medication Review (RN CM) Complete

## 2019-09-08 NOTE — Social Work (Signed)
Pt for d/c back to [facility] ALF today. Spoke to [place] at [facility] who confirmed they are able to accept pt COVID (+) and will quarantine. Notified pt's son, [name] [phone], who reports agreeable to d/c. RN to call report to [phone] (press [phone] and ask for [place]). DC summary faxed to facility [phone].   Wandra Feinstein, MSW, LCSW [phone] (GV coverage)

## 2019-09-08 NOTE — Progress Notes (Signed)
OT Cancellation Note  Patient Details Name: Kendra Charles MRN: [id number] DOB: 05-Oct-1941   Cancelled Treatment:    Reason Eval/Treat Not Completed: Other (comment); pt just returned to bed with nursing assist, having been up in chair most of the AM. Will follow up for OT eval as schedule permits.  Lou Cal, OT [facility] Pager [phone] Office [phone]   Raymondo Band 09/08/2019, 1:14 PM

## 2019-09-08 NOTE — Progress Notes (Signed)
2125- patient transferred to [facility] via [place].  PIV x 2 removed prior to d/c. VS 169/92, 22. 94% on RA.  No c/o from patient and no distress noted prior to discharge.  All belongings sent with patient.

## 2019-09-08 NOTE — Discharge Summary (Signed)
Physician Discharge Summary  Kendra Charles [id number] DOB: 06/11/1941 DOA: 09/04/2019  PCP: Patient, No Pcp Per  Admit date: 09/04/2019 Discharge date: 09/08/2019  Admitted From: ALF Disposition:  ALF  Recommendations for Outpatient Follow-up:  1. Follow up with PCP in 1-2 weeks 2. Please obtain BMP/CBC in one week  Discharge Condition: Stable CODE STATUS: Full Diet recommendation: As tolerated  Brief/Interim Summary: Kendra Charles a 78 y.o.femalewith history ofparoxysmal A. fib, seizure disorder, hyponatremia, hypertension,andhyperlipidemia presenting from ALF on 09/04/2019 with the above chief complaints.  Poor historian at [place] given dementia. Per ED provider, patient reported abdominal pain, back painandfever.She reported dizziness with standing, and fall at facility butfacilitydidn't witness fall.She was a started on Bactrim for presumed UTI at facility this morning. In ED, hemodynamically stable. Febrile to 100.4. Desaturated to 84% on room air.Started on 2 L by nasal cannula and saturation improved to upper 90s on 100. Sodium 132. Potassium 3.3. Otherwise, CBC and CMP not impressive. Lactic acid negative. UA with large LE and rare bacteria. COVID-19 swab positive. EKG sinus rhythm with occasional PACs but no acute ischemic finding or abnormal interval. CTA chest negative for PE but groundglass opacities concerning for COVID-19 infection,emphysema and ascending thoracic aortic aneurysm. No acute or significant finding within the abdomen or pelvis other than possible vaginal prolapse/cystocele. CT head without acute finding. Hospitalist was paged for admission for COVID-19 infection and possible UTI.  Patient admitted as above with acute hypoxic respiratory failure in the setting of COVID-19 pneumonia.  Hypoxia resolved soon after admission after administration of steroids, Remdesivir, and supportive care.  Questionable UTI on admission treated with  ceftriaxone, patient now completed remdesivir, will continue steroid taper at discharge.  And resolution of symptoms, patient without hypoxia further evaluation with PT and OT recommending return back to assisted living facility.  Patient should continue quarantine for at least 14 days from Covid swab on 09/04/2019.  Otherwise patient can follow with PCP as previously scheduled, no medication changes while inpatient other than initiation of prednisone taper and discontinuation of antibiotics at admission as patient has completed her course.  Discharge Diagnoses:  Principal Problem:   Hyponatremia Active Problems:   AKI (acute kidney injury) ([place])   Dementia ([place])   Essential hypertension   Hyperlipidemia   Acute hypoxemic respiratory failure due to COVID-19 [facility])    Discharge Instructions  Discharge Instructions    Call MD for:  difficulty breathing, headache or visual disturbances   Complete by: As directed    Call MD for:  extreme fatigue   Complete by: As directed    Call MD for:  hives   Complete by: As directed    Call MD for:  persistant dizziness or light-headedness   Complete by: As directed    Call MD for:  persistant nausea and vomiting   Complete by: As directed    Call MD for:  severe uncontrolled pain   Complete by: As directed    Call MD for:  temperature >100.4   Complete by: As directed    Diet - low sodium heart healthy   Complete by: As directed    Increase activity slowly   Complete by: As directed      Allergies as of 09/08/2019   No Known Allergies     Medication List    STOP taking these medications   sulfamethoxazole-trimethoprim 800-160 MG tablet Commonly known as: BACTRIM DS     TAKE these medications   acetaminophen 325 MG tablet Commonly known as: TYLENOL  Take 650 mg by mouth every 6 (six) hours as needed for mild pain.   alum & mag hydroxide-simeth 200-200-20 MG/5ML suspension Commonly known as: MAALOX/MYLANTA Take 30 mLs by mouth as  needed for indigestion or heartburn.   apixaban 5 MG Tabs tablet Commonly known as: ELIQUIS Take 1 tablet (5 mg total) by mouth 2 (two) times daily.   diazepam 5 MG tablet Commonly known as: VALIUM Take 1 tablet (5 mg total) by mouth every 12 (twelve) hours as needed for anxiety or muscle spasms.   diclofenac Sodium 1 % Gel Commonly known as: VOLTAREN Apply 2 g topically 4 (four) times daily.   donepezil 5 MG tablet Commonly known as: ARICEPT Take 5 mg by mouth at bedtime.   levETIRAcetam 500 MG tablet Commonly known as: KEPPRA Take 1 tablet (500 mg total) by mouth 2 (two) times daily.   lisinopril 30 MG tablet Commonly known as: ZESTRIL Take 30 mg by mouth daily.   loperamide 2 MG capsule Commonly known as: IMODIUM Take 2 mg by mouth as needed for diarrhea or loose stools.   lovastatin 40 MG tablet Commonly known as: MEVACOR Take 40 mg by mouth at bedtime.   Melatonin 3 MG Tabs Take 3 mg by mouth at bedtime.   Metoprolol Tartrate 75 MG Tabs Take 75 mg by mouth 2 (two) times daily. What changed: how much to take   predniSONE 10 MG tablet Commonly known as: DELTASONE Take 4 tablets (40 mg total) by mouth daily for 3 days, THEN 3 tablets (30 mg total) daily for 3 days, THEN 2 tablets (20 mg total) daily for 3 days, THEN 1 tablet (10 mg total) daily for 3 days. Start taking on: September 08, 2019       No Known Allergies   Procedures/Studies: Ct Head Wo Contrast  Result Date: 09/04/2019 CLINICAL DATA:  Head trauma, on anticoagulation EXAM: CT HEAD WITHOUT CONTRAST TECHNIQUE: Contiguous axial images were obtained from the base of the skull through the vertex without intravenous contrast. COMPARISON:  June 18, 2019 FINDINGS: Brain: There is no acute intracranial hemorrhage, mass-effect, or edema. There is no new loss of gray-white differentiation. 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 Additional confluent hypoattenuation in the supratentorial white matter likely reflects stable advanced chronic microvascular ischemic changes. Prominence of the ventricles and sulci reflects stable parenchymal volume loss. There is stable disproportionate ventricular prominence and communicating (normal pressure) hydrocephalus is not excluded. There is no extra-axial fluid collection. Vascular: There is atherosclerotic calcification at the skull base. Skull: Calvarium is unremarkable. Sinuses/Orbits: No acute finding. Other: None. IMPRESSION: No evidence of acute intracranial injury. Stable chronic findings detailed above. Electronically Signed   By: Macy Mis M.D.   On: 09/04/2019 14:56   Ct Angio Chest Pe W And/or Wo Contrast  Result Date: 09/04/2019 CLINICAL DATA:  Chest pain and abdominal pain. Generalized weakness. COVID-19 positive. EXAM: CT ANGIOGRAPHY CHEST CT ABDOMEN AND PELVIS WITH CONTRAST TECHNIQUE: Multidetector CT imaging of the chest was performed using the standard protocol during bolus administration of intravenous contrast. Multiplanar CT image reconstructions and MIPs were obtained to evaluate the vascular anatomy. Multidetector CT imaging of the abdomen and pelvis was performed using the standard protocol during bolus administration of intravenous contrast. CONTRAST:  114mL OMNIPAQUE IOHEXOL 350 MG/ML SOLN COMPARISON:  None. FINDINGS: CTA CHEST FINDINGS Cardiovascular: The heart is normal in size. No pericardial effusion. There  is tortuosity, ectasia and moderate atherosclerotic calcification of the thoracic aorta but no dissection. 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 The pulmonary arterial tree is well opacified. No filling defects to suggest pulmonary embolism. Mediastinum/Nodes: Small scattered mediastinal and hilar lymph nodes but no mass or overt adenopathy. None of these measures more  than 8 mm. The esophagus is grossly normal. Lungs/Pleura: Advanced emphysematous changes are noted along with pulmonary scarring. There are patchy bilateral peripheral ground-glass type infiltrates and interstitial thickening consistent with atypical viral pneumonia such as COVID-19. No focal airspace consolidation or pleural effusion. No worrisome pulmonary lesions. Musculoskeletal: No breast masses, supraclavicular or axillary adenopathy. The thyroid gland is grossly normal. The bony thorax is intact. No worrisome bone lesions. Moderate osteoporosis is noted and there are remote healed rib fractures noted. Review of the MIP images confirms the above findings. CT ABDOMEN and PELVIS FINDINGS Hepatobiliary: No focal hepatic lesions or intrahepatic biliary dilatation. The gallbladder is normal. No common bile duct dilatation. Pancreas: No mass, inflammation or ductal dilatation. Spleen: Normal size.  No focal lesions. Adrenals/Urinary Tract: Adrenal glands and kidneys are unremarkable. No worrisome renal lesions or hydronephrosis. Stomach/Bowel: The stomach, duodenum, small bowel and colon are grossly normal without oral contrast. No acute inflammatory changes, mass lesions or obstructive findings. Benign-appearing lipoma is noted in the descending duodenum. There is fairly significant diverticulosis involving the descending colon and sigmoid colon but no findings for acute diverticulitis. Vascular/Lymphatic: Advanced atherosclerotic calcifications involving the aorta and iliac arteries and major branch vessel ostia. The major venous structures are patent. No mesenteric or retroperitoneal mass or lymphadenopathy the. Reproductive: Surgically absent. Other: Evidence of pelvic floor relaxation. There is a moderate cystocele and possible vaginal prolapse also. Musculoskeletal: No significant bony findings. Osteoporosis is noted but no fractures or bone lesions. Review of the MIP images confirms the above findings.  IMPRESSION: 1. No CT findings for pulmonary embolism. 2. Tortuosity, ectasia and moderate atherosclerotic calcification involving the thoracic aorta. 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 Recommend annual imaging followup by CTA or MRA. This recommendation follows 2010 ACCF/AHA/AATS/ACR/ASA/SCA/SCAI/SIR/STS/SVM Guidelines for the Diagnosis and Management of Patients with Thoracic Aortic Disease. Circulation. 2010; [id number]. Aortic aneurysm NOS (ICD10-I71.9) 3. Patchy ground-glass infiltrates suggesting atypical viral pneumonia such as COVID-19. 4. Emphysematous changes and pulmonary scarring but no worrisome pulmonary lesions. 5. No acute abdominal/pelvic findings, mass lesions or lymphadenopathy. 6. Advanced atherosclerotic calcifications involving the aorta and iliac arteries and branch vessels but no aneurysm. 7. Evidence of pelvic floor laxity with cystocele and probable vaginal prolapse. Electronically Signed   By: Marijo Sanes M.D.   On: 09/04/2019 15:09   Ct Abdomen Pelvis W Contrast  Result Date: 09/04/2019 CLINICAL DATA:  Chest pain and abdominal pain. Generalized weakness. COVID-19 positive. EXAM: CT ANGIOGRAPHY CHEST CT ABDOMEN AND PELVIS WITH CONTRAST TECHNIQUE: Multidetector CT imaging of the chest was performed using the standard protocol during bolus administration of intravenous contrast. Multiplanar CT image reconstructions and MIPs were obtained to evaluate the vascular anatomy. Multidetector CT imaging of the abdomen and pelvis was performed using the standard protocol during bolus administration of intravenous contrast. CONTRAST:  185mL OMNIPAQUE IOHEXOL 350 MG/ML SOLN COMPARISON:  None. FINDINGS: CTA CHEST FINDINGS Cardiovascular: The heart is normal in size. No pericardial effusion. There is tortuosity, ectasia and moderate atherosclerotic calcification of the thoracic aorta but no dissection. There is fusiform aneurysmal  dilatation of the  ascending aorta with maximum diameter of 4.3 cm. The pulmonary arterial tree is well opacified. No filling defects to suggest pulmonary embolism. Mediastinum/Nodes: Small scattered mediastinal and hilar lymph nodes but no mass or overt adenopathy. None of these measures more than 8 mm. The esophagus is grossly normal. Lungs/Pleura: Advanced emphysematous changes are noted along with pulmonary scarring. There are patchy bilateral peripheral ground-glass type infiltrates and interstitial thickening consistent with atypical viral pneumonia such as COVID-19. No focal airspace consolidation or pleural effusion. No worrisome pulmonary lesions. Musculoskeletal: No breast masses, supraclavicular or axillary adenopathy. The thyroid gland is grossly normal. The bony thorax is intact. No worrisome bone lesions. Moderate osteoporosis is noted and there are remote healed rib fractures noted. Review of the MIP images confirms the above findings. CT ABDOMEN and PELVIS FINDINGS Hepatobiliary: No focal hepatic lesions or intrahepatic biliary dilatation. The gallbladder is normal. No common bile duct dilatation. Pancreas: No mass, inflammation or ductal dilatation. Spleen: Normal size.  No focal lesions. Adrenals/Urinary Tract: Adrenal glands and kidneys are unremarkable. No worrisome renal lesions or hydronephrosis. Stomach/Bowel: The stomach, duodenum, small bowel and colon are grossly normal without oral contrast. No acute inflammatory changes, mass lesions or obstructive findings. Benign-appearing lipoma is noted in the descending duodenum. There is fairly significant diverticulosis involving the descending colon and sigmoid colon but no findings for acute diverticulitis. Vascular/Lymphatic: Advanced atherosclerotic calcifications involving the aorta and iliac arteries and major branch vessel ostia. The major venous structures are patent. No mesenteric or retroperitoneal mass or lymphadenopathy the.  Reproductive: Surgically absent. Other: Evidence of pelvic floor relaxation. There is a moderate cystocele and possible vaginal prolapse also. Musculoskeletal: No significant bony findings. Osteoporosis is noted but no fractures or bone lesions. Review of the MIP images confirms the above findings. IMPRESSION: 1. No CT findings for pulmonary embolism. 2. Tortuosity, ectasia and moderate atherosclerotic calcification involving the thoracic aorta. 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 Recommend annual imaging followup by CTA or MRA. This recommendation follows 2010 ACCF/AHA/AATS/ACR/ASA/SCA/SCAI/SIR/STS/SVM Guidelines for the Diagnosis and Management of Patients with Thoracic Aortic Disease. Circulation. 2010; [id number]. Aortic aneurysm NOS (ICD10-I71.9) 3. Patchy ground-glass infiltrates suggesting atypical viral pneumonia such as COVID-19. 4. Emphysematous changes and pulmonary scarring but no worrisome pulmonary lesions. 5. No acute abdominal/pelvic findings, mass lesions or lymphadenopathy. 6. Advanced atherosclerotic calcifications involving the aorta and iliac arteries and branch vessels but no aneurysm. 7. Evidence of pelvic floor laxity with cystocele and probable vaginal prolapse. Electronically Signed   By: Marijo Sanes M.D.   On: 09/04/2019 15:09   Dg Chest Port 1 View  Result Date: 09/04/2019 CLINICAL DATA:  Left-sided chest pain, back pain EXAM: PORTABLE CHEST 1 VIEW COMPARISON:  06/24/2019 FINDINGS: There is no focal consolidation. There is no pleural effusion or pneumothorax. The heart size is enlarged. There is no acute osseous abnormality. IMPRESSION: No active disease. Electronically Signed   By: Kathreen Devoid   On: 09/04/2019 12:37    Subjective: No acute issues or events overnight, patient tolerating p.o. quite well, tolerating PT quite well given her age and known morbidities.  Patient declines shortness of breath, chest  pain, nausea, vomiting, diarrhea, constipation, headache, fevers, chills.   Discharge Exam: Vitals:   09/08/19 0800 09/08/19 1139  BP:  (!) 129/91  Pulse:  74  Resp:  16  Temp: 97.9 F (36.6 C) 98 F (36.7 C)  SpO2:  97%   Vitals:   09/08/19 0400 09/08/19  0500 09/08/19 0800 09/08/19 1139  BP: 126/81   (!) 129/91  Pulse: 67 81  74  Resp: 18 19  16   Temp: 98.4 F (36.9 C)  97.9 F (36.6 C) 98 F (36.7 C)  TempSrc: Oral  Oral Oral  SpO2: 91% 94%  97%  Weight:      Height:        General:  Pleasantly resting in bed, No acute distress.  Alert to person general situation only. HEENT:  Normocephalic atraumatic.  Sclerae nonicteric, noninjected.  Extraocular movements intact bilaterally. Neck:  Without mass or deformity.  Trachea is midline. Lungs:  Clear to auscultate bilaterally without rhonchi, wheeze, or rales. Heart:  Regular rate and rhythm.  Without murmurs, rubs, or gallops. Abdomen:  Soft, nontender, nondistended.  Without guarding or rebound. Extremities: Without cyanosis, clubbing, edema, or obvious deformity. Vascular:  Dorsalis pedis and posterior tibial pulses palpable bilaterally. Skin:  Warm and dry, no erythema, no ulcerations.   The results of significant diagnostics from this hospitalization (including imaging, microbiology, ancillary and laboratory) are listed below for reference.     Microbiology: Recent Results (from the past 240 hour(s))  Blood Culture (routine x 2)     Status: None (Preliminary result)   Collection Time: 09/04/19 12:22 PM   Specimen: BLOOD  Result Value Ref Range Status   Specimen Description   Final    BLOOD RIGHT ANTECUBITAL Performed at [facility] [address] [place], [place] [address]    Special Requests   Final    BOTTLES DRAWN AEROBIC AND ANAEROBIC Blood Culture adequate volume Performed at [facility] [address] [place], [place] [address]    Culture   Final    NO  GROWTH 4 DAYS Performed at [facility], [place] [address], [place], [place] [address]    Report Status PENDING  Incomplete  Urine culture     Status: None   Collection Time: 09/04/19 12:22 PM   Specimen: In/Out Cath Urine  Result Value Ref Range Status   Specimen Description   Final    IN/OUT CATH URINE Performed at [facility] [address], [place], [place] [address]    Special Requests   Final    NONE Performed at [facility], [place] [address] [place], [place] [address]    Culture   Final    NO GROWTH Performed at [facility], [place] [address] [place], [place] [address]    Report Status 09/05/2019 FINAL  Final  Blood Culture (routine x 2)     Status: None (Preliminary result)   Collection Time: 09/04/19  8:00 PM   Specimen: BLOOD LEFT HAND  Result Value Ref Range Status   Specimen Description   Final    BLOOD LEFT HAND Performed at [facility] [address], [place], [place] [address]    Special Requests   Final    BOTTLES DRAWN AEROBIC AND ANAEROBIC Blood Culture results may not be optimal due to an inadequate volume of blood received in culture bottles Performed at [facility] [address] [place], [place] [address]    Culture   Final    NO GROWTH 4 DAYS Performed at [facility], [place] [address], [place], [place] [address]    Report Status PENDING  Incomplete     Labs: BNP (last 3 results) Recent Labs    09/04/19 1656  BNP 286.2*  Basic Metabolic Panel: Recent Labs  Lab 09/04/19 1222 09/05/19 0730 09/06/19 0055 09/07/19 0330 09/08/19 0328  NA 132*  --  135 135 134*  K 3.3*  --  4.6 3.8 3.7  CL 96*  --  106 103 102  CO2 25  --  20* 22 23  GLUCOSE 101*  --  120* 99 117*  BUN 12  --  21 20 19   CREATININE 0.70  --  0.60 0.53 <0.30*  CALCIUM 8.8*  --  9.0 8.9 8.6*  MG  --  1.9 1.7 1.8 1.9  PHOS  --  4.0 2.4* 2.9 3.9   Liver Function Tests: Recent  Labs  Lab 09/04/19 1222 09/06/19 0055 09/07/19 0330 09/08/19 0328  AST 22 22 19 25   ALT 12 12 11 16   ALKPHOS 76 63 61 62  BILITOT 0.4 0.1* 0.2* 0.4  PROT 7.0 6.0* 5.5* 5.5*  ALBUMIN 4.0 3.2* 3.0* 3.0*   Recent Labs  Lab 09/04/19 1222  LIPASE 32   No results for input(s): AMMONIA in the last 168 hours. CBC: Recent Labs  Lab 09/04/19 1222 09/05/19 0730 09/06/19 0055 09/07/19 0330 09/08/19 0328  WBC 6.0 3.8* 8.2 8.6 6.1  NEUTROABS 4.6 2.2 5.7 5.5 3.8  HGB 12.8 11.8* 12.4 12.7 12.1  HCT 39.9 36.6 38.1 38.7 36.7  MCV 90.5 90.1 88.4 87.4 87.2  PLT 230 241 279 304 307   Cardiac Enzymes: Recent Labs  Lab 09/04/19 1656  CKTOTAL 311*  CKMB 2.4   BNP: Invalid input(s): POCBNP CBG: No results for input(s): GLUCAP in the last 168 hours. D-Dimer Recent Labs    09/07/19 0330 09/08/19 0328  DDIMER 0.62* 0.63*   Hgb A1c No results for input(s): HGBA1C in the last 72 hours. Lipid Profile No results for input(s): CHOL, HDL, LDLCALC, TRIG, CHOLHDL, LDLDIRECT in the last 72 hours. Thyroid function studies No results for input(s): TSH, T4TOTAL, T3FREE, THYROIDAB in the last 72 hours.  Invalid input(s): FREET3 Anemia work up Recent Labs    09/07/19 0330 09/08/19 0328  FERRITIN 363* 323*   Urinalysis    Component Value Date/Time   COLORURINE YELLOW 09/04/2019 [place] 09/04/2019 1222   LABSPEC 1.034 (H) 09/04/2019 1222   PHURINE 6.0 09/04/2019 1222   GLUCOSEU NEGATIVE 09/04/2019 1222   HGBUR NEGATIVE 09/04/2019 1222   BILIRUBINUR NEGATIVE 09/04/2019 [place] 09/04/2019 1222   PROTEINUR NEGATIVE 09/04/2019 1222   NITRITE NEGATIVE 09/04/2019 1222   LEUKOCYTESUR LARGE (A) 09/04/2019 1222   Sepsis Labs Invalid input(s): PROCALCITONIN,  WBC,  LACTICIDVEN Microbiology Recent Results (from the past 240 hour(s))  Blood Culture (routine x 2)     Status: None (Preliminary result)   Collection Time: 09/04/19 12:22 PM   Specimen:  BLOOD  Result Value Ref Range Status   Specimen Description   Final    BLOOD RIGHT ANTECUBITAL Performed at [facility], [place] [address] [place], [place] [address]    Special Requests   Final    BOTTLES DRAWN AEROBIC AND ANAEROBIC Blood Culture adequate volume Performed at [facility] [address], [place], [place] [address]    Culture   Final    NO GROWTH 4 DAYS Performed at [facility], [place] [address], [place],  [address]    Report Status PENDING  Incomplete  Urine culture     Status: None   Collection Time: 09/04/19 12:22 PM   Specimen: In/Out Cath Urine  Result Value Ref Range Status   Specimen Description   Final    IN/OUT CATH URINE Performed at [facility] [address], [place], [place] [address]    Special Requests   Final    NONE Performed at [facility], [place] [address], [place], [place] [address]    Culture   Final    NO GROWTH Performed at [facility], [place] [address], [place], [place] [address]    Report Status 09/05/2019 FINAL  Final  Blood Culture (routine x 2)     Status: None (Preliminary result)   Collection Time: 09/04/19  8:00 PM   Specimen: BLOOD LEFT HAND  Result Value Ref Range Status   Specimen Description   Final    BLOOD LEFT HAND Performed at [facility] [address], [place], [place] [address]    Special Requests   Final    BOTTLES DRAWN AEROBIC AND ANAEROBIC Blood Culture results may not be optimal due to an inadequate volume of blood received in culture bottles Performed at [facility] [address] [place], [place] [address]    Culture   Final    NO GROWTH 4 DAYS Performed at [facility], [place] [address], [place], [place] [address]    Report Status PENDING  Incomplete     Time coordinating discharge: Over 30 minutes  SIGNED:   Little Ishikawa, DO [facility] 09/08/2019, 3:29 PM Pager   If 7PM-7AM, please contact night-coverage [email or web address] Password [email or web address]

## 2019-09-09 LAB — CULTURE, BLOOD (ROUTINE X 2)
Culture: NO GROWTH
Culture: NO GROWTH
Special Requests: ADEQUATE

## 2019-11-10 ENCOUNTER — Ambulatory Visit (INDEPENDENT_AMBULATORY_CARE_PROVIDER_SITE_OTHER): Payer: Medicare Other | Admitting: Obstetrics & Gynecology

## 2019-11-10 ENCOUNTER — Other Ambulatory Visit: Payer: Self-pay

## 2019-11-10 VITALS — BP 200/105 | HR 79

## 2019-11-10 DIAGNOSIS — N813 Complete uterovaginal prolapse: Secondary | ICD-10-CM | POA: Diagnosis present

## 2019-11-10 NOTE — Progress Notes (Signed)
   Subjective:    Patient ID: Kendra Charles, female    DOB: 1940/11/19, 79 y.o.   MRN: [id number]  HPI 79 yo P4 here from the nursing home for a pessary placement. 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 I saw her 08/14/19 and offered a pessary PROVIDED that she come in at least every month for a cleaning/exam. At that time she declined but she is back here today with her daughter in law because the nursing home personnel were worried about the prolapse.   Review of Systems She reports that she was not given her BP meds this morning.    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 Reassurance given They were reminded that she needs her BP meds.

## 2020-04-14 ENCOUNTER — Ambulatory Visit: Payer: Medicare Other | Admitting: Family Medicine

## 2020-04-14 ENCOUNTER — Encounter: Payer: Self-pay | Admitting: Family Medicine

## 2020-04-14 NOTE — Progress Notes (Signed)
Patient did not keep appointment today. She may call to reschedule.  

## 2020-07-12 IMAGING — DX DG CHEST 1V PORT
1 series · 1 of 1 positions shown · non-contrast
Comparison: June 18, 2019

CLINICAL DATA: Altered mental status. Chest pain.

EXAM:
PORTABLE CHEST 1 VIEW

[chest ap]
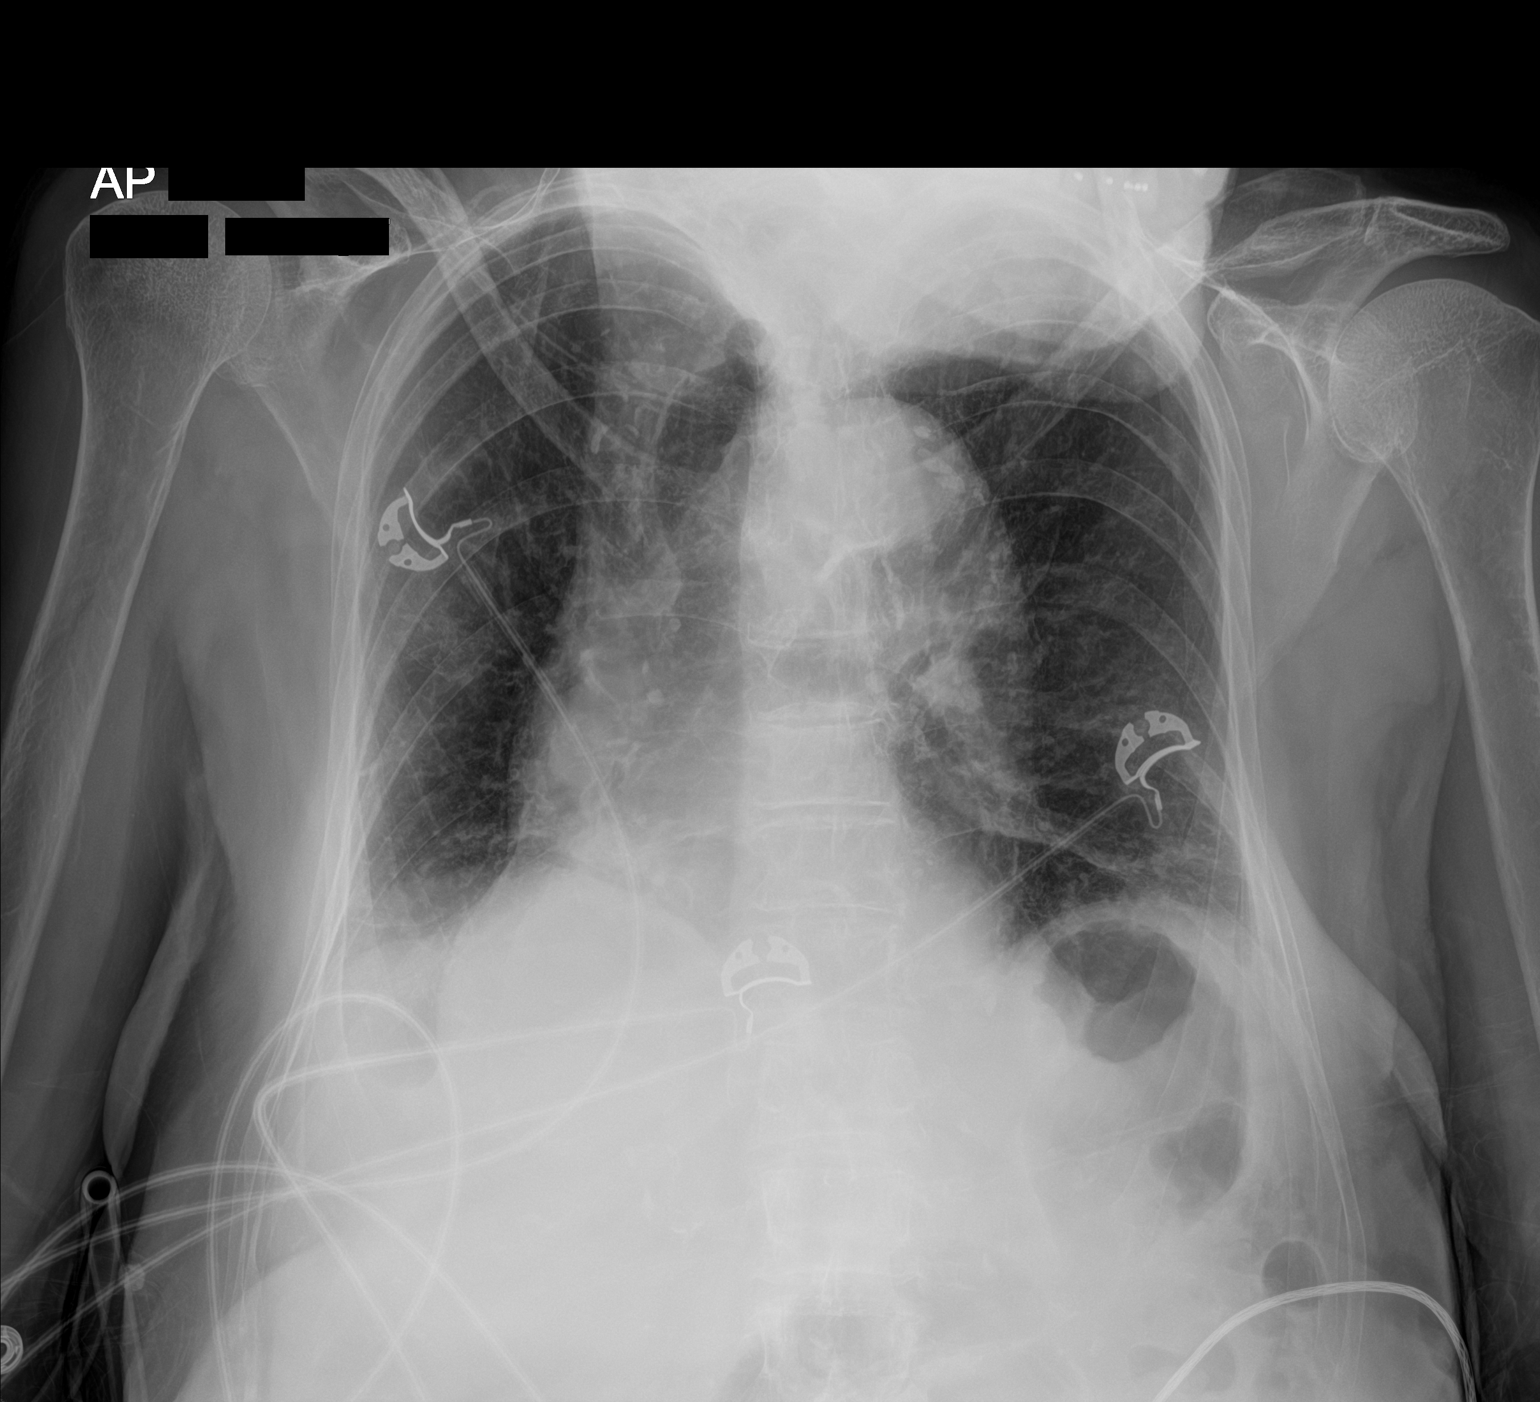

[1 of 1 positions shown; findings below may reference images not displayed]

FINDINGS: Mildly enlarged heart. Calcific atherosclerotic disease and
tortuosity of the aorta.

Bilateral lower lobe atelectasis versus scarring, not significantly
changed.

Osseous structures are without acute abnormality. Soft tissues are
grossly normal.
IMPRESSION: Bilateral lower lobe atelectasis versus scarring, not significantly
changed.

## 2020-09-15 ENCOUNTER — Emergency Department (HOSPITAL_COMMUNITY): Payer: Medicare Other

## 2020-09-15 ENCOUNTER — Emergency Department (HOSPITAL_COMMUNITY)
Admission: EM | Admit: 2020-09-15 | Discharge: 2020-09-15 | Disposition: A | Discharge status: Home / Self Care | Payer: Medicare Other | Attending: Emergency Medicine | Admitting: Emergency Medicine

## 2020-09-15 ENCOUNTER — Encounter (HOSPITAL_COMMUNITY): Payer: Self-pay

## 2020-09-15 ENCOUNTER — Other Ambulatory Visit: Payer: Self-pay

## 2020-09-15 DIAGNOSIS — F0390 Unspecified dementia without behavioral disturbance: Secondary | ICD-10-CM | POA: Insufficient documentation

## 2020-09-15 DIAGNOSIS — Z79899 Other long term (current) drug therapy: Secondary | ICD-10-CM | POA: Diagnosis not present

## 2020-09-15 DIAGNOSIS — I4891 Unspecified atrial fibrillation: Secondary | ICD-10-CM | POA: Diagnosis not present

## 2020-09-15 DIAGNOSIS — Z87891 Personal history of nicotine dependence: Secondary | ICD-10-CM | POA: Insufficient documentation

## 2020-09-15 DIAGNOSIS — R55 Syncope and collapse: Secondary | ICD-10-CM | POA: Insufficient documentation

## 2020-09-15 DIAGNOSIS — I10 Essential (primary) hypertension: Secondary | ICD-10-CM | POA: Diagnosis not present

## 2020-09-15 DIAGNOSIS — Z8616 Personal history of COVID-19: Secondary | ICD-10-CM | POA: Diagnosis not present

## 2020-09-15 DIAGNOSIS — Z7901 Long term (current) use of anticoagulants: Secondary | ICD-10-CM | POA: Diagnosis not present

## 2020-09-15 DIAGNOSIS — N3001 Acute cystitis with hematuria: Secondary | ICD-10-CM | POA: Diagnosis not present

## 2020-09-15 DIAGNOSIS — N3000 Acute cystitis without hematuria: Secondary | ICD-10-CM

## 2020-09-15 LAB — URINALYSIS, ROUTINE W REFLEX MICROSCOPIC
Bilirubin Urine: NEGATIVE
Glucose, UA: NEGATIVE mg/dL
Hgb urine dipstick: NEGATIVE
Ketones, ur: NEGATIVE mg/dL
Nitrite: POSITIVE — AB
Protein, ur: NEGATIVE mg/dL
Specific Gravity, Urine: 1.009 (ref 1.005–1.030)
pH: 7 (ref 5.0–8.0)

## 2020-09-15 LAB — COMPREHENSIVE METABOLIC PANEL
ALT: 21 U/L (ref 0–44)
AST: 38 U/L (ref 15–41)
Albumin: 4 g/dL (ref 3.5–5.0)
Alkaline Phosphatase: 67 U/L (ref 38–126)
Anion gap: 13 (ref 5–15)
BUN: 13 mg/dL (ref 8–23)
CO2: 29 mmol/L (ref 22–32)
Calcium: 9.7 mg/dL (ref 8.9–10.3)
Chloride: 97 mmol/L — ABNORMAL LOW (ref 98–111)
Creatinine, Ser: 0.82 mg/dL (ref 0.44–1.00)
GFR, Estimated: >60 mL/min (ref >60)
Glucose, Bld: 110 mg/dL — ABNORMAL HIGH (ref 70–99)
Potassium: 5.1 mmol/L (ref 3.5–5.1)
Sodium: 139 mmol/L (ref 135–145)
Total Bilirubin: 1.2 mg/dL (ref 0.3–1.2)
Total Protein: 6.3 g/dL — ABNORMAL LOW (ref 6.5–8.1)

## 2020-09-15 LAB — CBC WITH DIFFERENTIAL/PLATELET
Abs Immature Granulocytes: 0.03 10*3/uL (ref 0.00–0.07)
Basophils Absolute: 0.1 10*3/uL (ref 0.0–0.1)
Basophils Relative: 1 %
Eosinophils Absolute: 0.2 10*3/uL (ref 0.0–0.5)
Eosinophils Relative: 1 %
HCT: 40.1 % (ref 36.0–46.0)
Hemoglobin: 13 g/dL (ref 12.0–15.0)
Immature Granulocytes: 0 %
Lymphocytes Relative: 25 %
Lymphs Abs: 2.8 10*3/uL (ref 0.7–4.0)
MCH: 29.7 pg (ref 26.0–34.0)
MCHC: 32.4 g/dL (ref 30.0–36.0)
MCV: 91.8 fL (ref 80.0–100.0)
Monocytes Absolute: 0.9 10*3/uL (ref 0.1–1.0)
Monocytes Relative: 9 %
Neutro Abs: 6.9 10*3/uL (ref 1.7–7.7)
Neutrophils Relative %: 64 %
Platelets: 233 10*3/uL (ref 150–400)
RBC: 4.37 MIL/uL (ref 3.87–5.11)
RDW: 13.2 % (ref 11.5–15.5)
WBC: 10.8 10*3/uL — ABNORMAL HIGH (ref 4.0–10.5)
nRBC: 0 % (ref 0.0–0.2)

## 2020-09-15 LAB — TROPONIN I (HIGH SENSITIVITY)
Troponin I (High Sensitivity): 4 ng/L (ref <18)
Troponin I (High Sensitivity): 5 ng/L (ref <18)

## 2020-09-15 LAB — MAGNESIUM: Magnesium: 2.1 mg/dL (ref 1.7–2.4)

## 2020-09-15 LAB — CBG MONITORING, ED: Glucose-Capillary: 118 mg/dL — ABNORMAL HIGH (ref 70–99)

## 2020-09-15 LAB — LIPASE, BLOOD: Lipase: 27 U/L (ref 11–51)

## 2020-09-15 MED ORDER — LEVETIRACETAM IN NACL 1000 MG/100ML IV SOLN
1000.0000 mg | Freq: Once | INTRAVENOUS | Status: DC
  Filled 2020-09-15: qty 100

## 2020-09-15 MED ORDER — CEPHALEXIN 500 MG PO CAPS: 500.0000 mg | ORAL_CAPSULE | Freq: Four times a day (QID) | ORAL | 0 refills | Status: DC

## 2020-09-15 MED ORDER — SODIUM CHLORIDE 0.9 % IV SOLN
1.0000 g | Freq: Once | INTRAVENOUS | Status: AC
  Administered 2020-09-15: 1 g via INTRAVENOUS
  Filled 2020-09-15: qty 10

## 2020-09-15 NOTE — ED Provider Notes (Signed)
[name] EMERGENCY DEPARTMENT Provider Note   CSN: [id number] Arrival date & time: 09/15/20  1547     History Chief Complaint  Patient presents with  . Loss of Consciousness    LEVEL 5 CAVEAT - DEMENTIA  Kendra Charles is a 79 y.o. female with PMHx A fib on Eliquis, Dementia, Seizures on Keppra, and HTN who presents to the ED today via EMS for syncopal episode. Per EMS pt was on her toilet when she was found by staff at [facility] slumped over and looking like she was going to fall. They helped her down from the toilet and she remained unresponsive for 3-4 minutes. When EMS arrived pt was at her baseline. Pt states she cannot recall what happened - she remembers being on the toilet with the "runs" this morning which is atypical for her. She states that the next thing she knew she was on the ground. She denies any pain at this time however states she was having some abdominal pain earlier while on the toilet.   Additional information obtained by staff - states that pt was found by housekeeping who went in to clean her room. She seemed like she was about to fall sideways from the toilet so she was lowered down. They state that she was not responding for a short period of time and her eyes seemed to be "rolling in the back of her her head."   The history is provided by the patient, the EMS personnel, the nursing home and medical records.       Past Medical History:  Diagnosis Date  . Hypercholesteremia   . Hypertension     Patient Active Problem List   Diagnosis Date Noted  . Acute hypoxemic respiratory failure due to COVID-19 ([place]) 09/04/2019  . Seizure ([place]) 06/19/2019  . Hyponatremia 06/18/2019  . AKI (acute kidney injury) ([place]) 06/18/2019  . Dementia ([place]) 06/18/2019  . Essential hypertension 06/18/2019  . Hyperlipidemia 06/18/2019    History reviewed. No pertinent surgical history.   OB History   No obstetric history on file.     Family  History  Problem Relation Age of Onset  . Hypertension Mother   . Hypertension Father     Social History   Tobacco Use  . Smoking status: Former Research scientist (life sciences)  . Smokeless tobacco: Never Used  Vaping Use  . Vaping Use: Never used  Substance Use Topics  . Alcohol use: Not Currently  . Drug use: Not Currently    Home Medications Prior to Admission medications   Medication Sig Start Date End Date Taking? Authorizing Provider  acetaminophen (TYLENOL) 325 MG tablet Take 650 mg by mouth every 6 (six) hours as needed for mild pain.    [provider]  alum & mag hydroxide-simeth (MAALOX/MYLANTA) 200-200-20 MG/5ML suspension Take 30 mLs by mouth as needed for indigestion or heartburn.    [provider]  apixaban (ELIQUIS) 5 MG TABS tablet Take 1 tablet (5 mg total) by mouth 2 (two) times daily. 06/23/19   Earlene Plater, MD  cephALEXin (KEFLEX) 500 MG capsule Take 1 capsule (500 mg total) by mouth 4 (four) times daily. 09/15/20   Alroy Bailiff, Srah Ake, PA-C  diazepam (VALIUM) 5 MG tablet Take 1 tablet (5 mg total) by mouth every 12 (twelve) hours as needed for anxiety or muscle spasms. 06/25/19   Earlene Plater, MD  diclofenac Sodium (VOLTAREN) 1 % GEL Apply 2 g topically 4 (four) times daily.    [provider]  donepezil (ARICEPT) 5 MG tablet Take 5 mg by mouth at bedtime.    [provider]  levETIRAcetam (KEPPRA) 500 MG tablet Take 1 tablet (500 mg total) by mouth 2 (two) times daily. 06/25/19   Earlene Plater, MD  lisinopril (ZESTRIL) 30 MG tablet Take 30 mg by mouth daily.    [provider]  loperamide (IMODIUM) 2 MG capsule Take 2 mg by mouth as needed for diarrhea or loose stools.    [provider]  lovastatin (MEVACOR) 40 MG tablet Take 40 mg by mouth at bedtime.  06/17/19   [provider]  Melatonin 3 MG TABS Take 3 mg by mouth at bedtime.    [provider]  Metoprolol Tartrate 75 MG TABS Take 75 mg by mouth 2 (two)  times daily. Patient taking differently: Take 50 mg by mouth 2 (two) times daily.  06/25/19   Earlene Plater, MD    Allergies    Patient has no known allergies.  Review of Systems   Review of Systems  Unable to perform ROS: Dementia  Respiratory: Negative for shortness of breath.   Cardiovascular: Negative for chest pain.  Gastrointestinal: Positive for abdominal pain (resolved) and diarrhea. Negative for nausea and vomiting.  Musculoskeletal: Negative for arthralgias.  Neurological: Positive for syncope.    Physical Exam Updated Vital Signs BP (!) 154/110 (BP Location: Right Arm)   Pulse 98   Temp 98.2 F (36.8 C) (Oral)   Resp 14   SpO2 98%   Physical Exam Vitals and nursing note reviewed.  Constitutional:      Appearance: She is not ill-appearing.  HENT:     Head: Normocephalic and atraumatic.     Comments: No signs of head trauma. No racoon's sign or battle's sign. Negative hemotympanum bilaterally.     Right Ear: Tympanic membrane normal.     Left Ear: Tympanic membrane normal.  Eyes:     Extraocular Movements: Extraocular movements intact.     Conjunctiva/sclera: Conjunctivae normal.     Pupils: Pupils are equal, round, and reactive to light.  Cardiovascular:     Rate and Rhythm: Normal rate and regular rhythm.  Pulmonary:     Effort: Pulmonary effort is normal.     Breath sounds: Normal breath sounds. No wheezing, rhonchi or rales.  Abdominal:     Palpations: Abdomen is soft.     Tenderness: There is no abdominal tenderness. There is no right CVA tenderness, left CVA tenderness, guarding or rebound.  Musculoskeletal:     Cervical back: Neck supple. No tenderness.  Skin:    General: Skin is warm and dry.  Neurological:     Mental Status: She is alert.     Comments: Alert and oriented to self, place, and event. Pt is unsure what year it is but knows that it is December.   Speech is fluent, clear without dysarthria or dysphasia.   Strength 5/5 in  upper/lower extremities  Sensation intact in upper/lower extremities   Negative Romberg. No pronator drift.  Normal finger-to-nose and feet tapping.  CN I not tested  CN II grossly intact visual fields bilaterally. Did not visualize posterior eye.   CN III, IV, VI PERRLA and EOMs intact bilaterally  CN V Intact sensation to sharp and light touch to the face  CN VII facial movements symmetric  CN VIII not tested  CN IX, X no uvula deviation, symmetric rise of soft palate  CN XI 5/5 SCM and trapezius strength bilaterally  CN  XII Midline tongue protrusion, symmetric L/R movements      ED Results / Procedures / Treatments   Labs (all labs ordered are listed, but only abnormal results are displayed) Labs Reviewed  COMPREHENSIVE METABOLIC PANEL - Abnormal; Notable for the following components:      Result Value   Chloride 97 (*)    Glucose, Bld 110 (*)    Total Protein 6.3 (*)    All other components within normal limits  CBC WITH DIFFERENTIAL/PLATELET - Abnormal; Notable for the following components:   WBC 10.8 (*)    All other components within normal limits  URINALYSIS, ROUTINE W REFLEX MICROSCOPIC - Abnormal; Notable for the following components:   Nitrite POSITIVE (*)    Leukocytes,Ua MODERATE (*)    Bacteria, UA RARE (*)    All other components within normal limits  CBG MONITORING, ED - Abnormal; Notable for the following components:   Glucose-Capillary 118 (*)    All other components within normal limits  URINE CULTURE  LIPASE, BLOOD  MAGNESIUM  TROPONIN I (HIGH SENSITIVITY)  TROPONIN I (HIGH SENSITIVITY)    EKG None  Radiology CT Head Wo Contrast  Result Date: 09/15/2020 CLINICAL DATA:  79 year old female with head trauma. EXAM: CT HEAD WITHOUT CONTRAST TECHNIQUE: Contiguous axial images were obtained from the base of the skull through the vertex without intravenous contrast. COMPARISON:  Head CT dated 09/04/2019. FINDINGS: Brain: There is moderate age-related  atrophy and chronic microvascular ischemic changes. 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 Small left thalamic old lacunar infarct. There is no acute intracranial hemorrhage. No mass effect midline shift. No extra-axial fluid collection. Vascular: No hyperdense vessel or unexpected calcification. Skull: Normal. Negative for fracture or focal lesion. Sinuses/Orbits: No acute finding. Other: None IMPRESSION: 1. No acute intracranial hemorrhage. 2. Moderate age-related atrophy and chronic microvascular ischemic changes. 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 Electronically Signed   By: Anner Crete M.D.   On: 09/15/2020 18:08    Procedures Procedures (including critical care time)  Medications Ordered in ED Medications  cefTRIAXone (ROCEPHIN) 1 g in sodium chloride 0.9 % 100 mL IVPB (1 g Intravenous New Bag/Given 09/15/20 1807)    ED Course  I have reviewed the triage vital signs and the nursing notes.  Pertinent labs & imaging results that were available during my care of the patient were reviewed by me and considered in my medical decision making (see chart for details).    MDM Rules/Calculators/A&P                          79 year old female who presents to the ED from [facility] for syncopal episode.  It appears patient was on her toilet which was found slightly slumped over and brought back to down to the floor.  [place] denies hitting head however patient thinks she may have hit her head, she is anticoagulated on Eliquis.  It is unsure why she is on Eliquis however does appear she has a history of A. fib and is A. fib on the monitor and EKG.  Patient denies any precipitating events prior to passing out, does states she was on the toilet with diarrhea.  Denies any recent suspicious food intake.  No recent antibiotic use per [facility] that she was sent with.  States she had some abdominal pain while in the toilet however  on exam she has no focal abdominal tenderness palpation.  On arrival  vitals are stable.  Patient is afebrile, nontachycardic nontachypneic.  She is pleasantly demented without any signs of head trauma.  Remainder of exam unremarkable.  Additional information obtained by staff at nursing home he reports he seemed slightly more confused when coming to, unsure if she had a seizure, states her eyes were rolling in the back of her head however denies any tonic-clonic jerking.  Patient is on 500 mg twice daily of Keppra.  We will plan for lab work at this time including CT head, CBC, CMP, lipase, UA, mag, troponin.  Discussed loading with keppra with attending physician Dr. Alvino Chapel who does not believe it is warranted today; pt likely had a vasovagal response from being on the toilet and passed out. Will continue to monitor.   CBC with leukocytosis 10.8; likely elevated s/2 acute phase reactant CMP without electrolyte abnormalities Lipase 27 Troponin of 4 Mag 2.1 CT Head negative for bleed U/A has returned with positive nitrites and leuks with 11-20 WBCs and rare bacteria. Will obtain culture and treat with rocephin at this time.   Repeat troponin of 5 Will plan to discharge at this time. Again suspect pt likely had a vasovagal response. Will have her follow up with PCP for same. Prescribed keflex for UTI.   This note was prepared using Dragon voice recognition software and may include unintentional dictation errors due to the inherent limitations of voice recognition software.  Final Clinical Impression(s) / ED Diagnoses Final diagnoses:  Syncope and collapse  Acute cystitis without hematuria    Rx / DC Orders ED Discharge Orders         Ordered    cephALEXin (KEFLEX) 500 MG capsule  4 times daily        09/15/20 1939           Discharge Instructions     You were found to have a urinary tract infection today. Please take antibiotics as prescribed.  The remainder of your labs and  imaging were reassuring. Follow up with your PCP regarding your ED visit.  Return to the ED for any worsening symptoms       Eustaquio Maize, Hershal Coria 09/15/20 1940    Davonna Belling, MD 09/16/20 [phone]

## 2020-09-15 NOTE — ED Triage Notes (Signed)
Patient arrives via ems after syncopal episode at [facility]. Housekeeping saw her lethargic on toilet and by the time the nurse arrived the patient was unresponsive. Patient was lowered to the floor by nurse and remained unresponsive approx 3-4 min. Hx of dementia and is at baseline.  HR 78 BP 136/90

## 2020-09-15 NOTE — ED Notes (Signed)
Called [facility] 

## 2020-09-15 NOTE — Discharge Instructions (Addendum)
You were found to have a urinary tract infection today. Please take antibiotics as prescribed.  The remainder of your labs and imaging were reassuring. Follow up with your PCP regarding your ED visit.  Return to the ED for any worsening symptoms

## 2020-09-18 LAB — URINE CULTURE: Culture: >=100000 — AB

## 2020-09-19 ENCOUNTER — Telehealth: Payer: Self-pay | Admitting: *Deleted

## 2020-09-19 NOTE — Telephone Encounter (Signed)
Post ED Visit - Positive Culture Follow-up  Culture report reviewed by antimicrobial stewardship pharmacist: [place] Team []  Elenor Quinones, Pharm.D. []  Heide Guile, Pharm.D., BCPS AQ-ID []  Parks Neptune, Pharm.D., BCPS []  Alycia Rossetti, Pharm.D., BCPS []  [place], Pharm.D., BCPS, AAHIVP []  Legrand Como, Pharm.D., BCPS, AAHIVP []  Salome Arnt, PharmD, BCPS []  Johnnette Gourd, PharmD, BCPS []  Hughes Better, PharmD, BCPS []  Leeroy Cha, PharmD []  Laqueta Linden, PharmD, BCPS []  Albertina Parr, PharmD  [place] Team []  Leodis Sias, PharmD []  Lindell Spar, PharmD []  Royetta Asal, PharmD []  Graylin Shiver, Rph []  Rema Fendt) Glennon Mac, PharmD []  Arlyn Dunning, PharmD []  Netta Cedars, PharmD []  Dia Sitter, PharmD []  Leone Haven, PharmD []  Gretta Arab, PharmD []  Theodis Shove, PharmD []  Peggyann Juba, PharmD []  Reuel Boom, PharmD   Positive urine culture Treated with Cephalexin, organism sensitive to the same and no further patient follow-up is required at this time.  Harlon Flor [facility] 09/19/2020, 12:28 PM

## 2021-03-18 ENCOUNTER — Emergency Department (HOSPITAL_COMMUNITY): Payer: Medicare Other

## 2021-03-18 ENCOUNTER — Inpatient Hospital Stay (HOSPITAL_COMMUNITY)
Admission: EM | Admit: 2021-03-18 | Discharge: 2021-03-23 | DRG: 871 | Disposition: A | Discharge status: Skilled Nursing Facility | Payer: Medicare Other | Attending: Family Medicine | Admitting: Family Medicine

## 2021-03-18 DIAGNOSIS — G40909 Epilepsy, unspecified, not intractable, without status epilepticus: Secondary | ICD-10-CM | POA: Diagnosis present

## 2021-03-18 DIAGNOSIS — E876 Hypokalemia: Secondary | ICD-10-CM

## 2021-03-18 DIAGNOSIS — R4189 Other symptoms and signs involving cognitive functions and awareness: Secondary | ICD-10-CM

## 2021-03-18 DIAGNOSIS — E785 Hyperlipidemia, unspecified: Secondary | ICD-10-CM | POA: Diagnosis present

## 2021-03-18 DIAGNOSIS — Z79899 Other long term (current) drug therapy: Secondary | ICD-10-CM

## 2021-03-18 DIAGNOSIS — D72829 Elevated white blood cell count, unspecified: Secondary | ICD-10-CM | POA: Diagnosis present

## 2021-03-18 DIAGNOSIS — G9341 Metabolic encephalopathy: Secondary | ICD-10-CM | POA: Diagnosis present

## 2021-03-18 DIAGNOSIS — Z7901 Long term (current) use of anticoagulants: Secondary | ICD-10-CM

## 2021-03-18 DIAGNOSIS — N179 Acute kidney failure, unspecified: Secondary | ICD-10-CM | POA: Diagnosis present

## 2021-03-18 DIAGNOSIS — F0390 Unspecified dementia without behavioral disturbance: Secondary | ICD-10-CM | POA: Diagnosis present

## 2021-03-18 DIAGNOSIS — J9690 Respiratory failure, unspecified, unspecified whether with hypoxia or hypercapnia: Secondary | ICD-10-CM

## 2021-03-18 DIAGNOSIS — N390 Urinary tract infection, site not specified: Secondary | ICD-10-CM | POA: Diagnosis present

## 2021-03-18 DIAGNOSIS — I480 Paroxysmal atrial fibrillation: Secondary | ICD-10-CM | POA: Diagnosis present

## 2021-03-18 DIAGNOSIS — A419 Sepsis, unspecified organism: Principal | ICD-10-CM

## 2021-03-18 DIAGNOSIS — Z20822 Contact with and (suspected) exposure to covid-19: Secondary | ICD-10-CM | POA: Diagnosis present

## 2021-03-18 DIAGNOSIS — E7800 Pure hypercholesterolemia, unspecified: Secondary | ICD-10-CM | POA: Diagnosis present

## 2021-03-18 DIAGNOSIS — I4891 Unspecified atrial fibrillation: Secondary | ICD-10-CM | POA: Diagnosis present

## 2021-03-18 DIAGNOSIS — E872 Acidosis: Secondary | ICD-10-CM | POA: Diagnosis present

## 2021-03-18 DIAGNOSIS — Z8249 Family history of ischemic heart disease and other diseases of the circulatory system: Secondary | ICD-10-CM | POA: Diagnosis not present

## 2021-03-18 DIAGNOSIS — Z87891 Personal history of nicotine dependence: Secondary | ICD-10-CM

## 2021-03-18 DIAGNOSIS — R569 Unspecified convulsions: Secondary | ICD-10-CM

## 2021-03-18 DIAGNOSIS — R6520 Severe sepsis without septic shock: Secondary | ICD-10-CM | POA: Diagnosis present

## 2021-03-18 DIAGNOSIS — J9601 Acute respiratory failure with hypoxia: Secondary | ICD-10-CM | POA: Diagnosis present

## 2021-03-18 DIAGNOSIS — R197 Diarrhea, unspecified: Secondary | ICD-10-CM | POA: Diagnosis present

## 2021-03-18 DIAGNOSIS — Z8616 Personal history of COVID-19: Secondary | ICD-10-CM

## 2021-03-18 DIAGNOSIS — J690 Pneumonitis due to inhalation of food and vomit: Secondary | ICD-10-CM | POA: Diagnosis present

## 2021-03-18 DIAGNOSIS — B965 Pseudomonas (aeruginosa) (mallei) (pseudomallei) as the cause of diseases classified elsewhere: Secondary | ICD-10-CM | POA: Diagnosis present

## 2021-03-18 DIAGNOSIS — R4182 Altered mental status, unspecified: Secondary | ICD-10-CM

## 2021-03-18 DIAGNOSIS — I10 Essential (primary) hypertension: Secondary | ICD-10-CM | POA: Diagnosis present

## 2021-03-18 DIAGNOSIS — R404 Transient alteration of awareness: Secondary | ICD-10-CM

## 2021-03-18 LAB — CBC WITH DIFFERENTIAL/PLATELET
Abs Immature Granulocytes: 0.41 10*3/uL — ABNORMAL HIGH (ref 0.00–0.07)
Basophils Absolute: 0.1 10*3/uL (ref 0.0–0.1)
Basophils Relative: 0 %
Eosinophils Absolute: 0.1 10*3/uL (ref 0.0–0.5)
Eosinophils Relative: 0 %
HCT: 36.8 % (ref 36.0–46.0)
Hemoglobin: 12.6 g/dL (ref 12.0–15.0)
Immature Granulocytes: 2 %
Lymphocytes Relative: 4 %
Lymphs Abs: 1.2 10*3/uL (ref 0.7–4.0)
MCH: 30.1 pg (ref 26.0–34.0)
MCHC: 34.2 g/dL (ref 30.0–36.0)
MCV: 88 fL (ref 80.0–100.0)
Monocytes Absolute: 2.3 10*3/uL — ABNORMAL HIGH (ref 0.1–1.0)
Monocytes Relative: 8 %
Neutro Abs: 22.9 10*3/uL — ABNORMAL HIGH (ref 1.7–7.7)
Neutrophils Relative %: 86 %
Platelets: 265 10*3/uL (ref 150–400)
RBC: 4.18 MIL/uL (ref 3.87–5.11)
RDW: 13.2 % (ref 11.5–15.5)
WBC: 26.9 10*3/uL — ABNORMAL HIGH (ref 4.0–10.5)
nRBC: 0 % (ref 0.0–0.2)

## 2021-03-18 LAB — RESP PANEL BY RT-PCR (FLU A&B, COVID) ARPGX2
Influenza A by PCR: NEGATIVE
Influenza B by PCR: NEGATIVE
SARS Coronavirus 2 by RT PCR: NEGATIVE

## 2021-03-18 LAB — COMPREHENSIVE METABOLIC PANEL
ALT: 14 U/L (ref 0–44)
AST: 25 U/L (ref 15–41)
Albumin: 3.3 g/dL — ABNORMAL LOW (ref 3.5–5.0)
Alkaline Phosphatase: 67 U/L (ref 38–126)
Anion gap: 9 (ref 5–15)
BUN: 16 mg/dL (ref 8–23)
CO2: 24 mmol/L (ref 22–32)
Calcium: 8.5 mg/dL — ABNORMAL LOW (ref 8.9–10.3)
Chloride: 100 mmol/L (ref 98–111)
Creatinine, Ser: 1.06 mg/dL — ABNORMAL HIGH (ref 0.44–1.00)
GFR, Estimated: 54 mL/min — ABNORMAL LOW (ref >60)
Glucose, Bld: 145 mg/dL — ABNORMAL HIGH (ref 70–99)
Potassium: 2.7 mmol/L — CL (ref 3.5–5.1)
Sodium: 133 mmol/L — ABNORMAL LOW (ref 135–145)
Total Bilirubin: 1.3 mg/dL — ABNORMAL HIGH (ref 0.3–1.2)
Total Protein: 5.9 g/dL — ABNORMAL LOW (ref 6.5–8.1)

## 2021-03-18 LAB — LACTIC ACID, PLASMA
Lactic Acid, Venous: 2.6 mmol/L (ref 0.5–1.9)
Lactic Acid, Venous: 2.6 mmol/L (ref 0.5–1.9)

## 2021-03-18 MED ORDER — SODIUM CHLORIDE 0.9 % IV SOLN
2.0000 g | Freq: Once | INTRAVENOUS | Status: AC
  Administered 2021-03-18: 2 g via INTRAVENOUS
  Filled 2021-03-18: qty 20

## 2021-03-18 MED ORDER — LACTATED RINGERS IV BOLUS
1000.0000 mL | Freq: Once | INTRAVENOUS | Status: AC
  Administered 2021-03-18: 1000 mL via INTRAVENOUS

## 2021-03-18 MED ORDER — POTASSIUM CHLORIDE 20 MEQ PO PACK
20.0000 meq | PACK | Freq: Two times a day (BID) | ORAL | Status: DC
  Administered 2021-03-18: 20 meq via ORAL
  Filled 2021-03-18: qty 1

## 2021-03-18 MED ORDER — LACTATED RINGERS IV BOLUS (SEPSIS)
500.0000 mL | Freq: Once | INTRAVENOUS | Status: AC
  Administered 2021-03-19: 500 mL via INTRAVENOUS

## 2021-03-18 MED ORDER — POTASSIUM CHLORIDE 10 MEQ/100ML IV SOLN
10.0000 meq | INTRAVENOUS | Status: AC
  Administered 2021-03-19 (×3): 10 meq via INTRAVENOUS
  Filled 2021-03-18 (×3): qty 100

## 2021-03-18 NOTE — ED Notes (Signed)
IV infiltrated. Attempted PIV x2 unsuccessful iv team requested

## 2021-03-18 NOTE — ED Provider Notes (Signed)
[facility] EMERGENCY DEPARTMENT Provider Note   CSN: [id number] Arrival date & time: 03/18/21  2021     History Chief Complaint  Patient presents with  . Altered Mental Status    Kendra Charles is a 80 y.o. female.  HPI: Patient is a 80 year old female with a history of dementia, paroxysmal atrial fibrillation, hypertension, seizures on Keppra and hyperlipidemia who presented to the emergency department via EMS for altered mental status. Report from EMS was that she was found unresponsive and CPR was given by the facility prior to their arrival.  When EMS found the patient she was alert and oriented to person and place but not time.  Arrival to the emergency department patient was asymptomatic.  She said that she may have been feeling sick earlier today however had no complaints at the time of my exam.  I called and spoke with the [facility] where the patient is a resident.  I spoke with the patient's caregiver who found her unresponsive.  She tells me that the patient was sitting in a reclining chair and was initially unresponsive to voice but then woke up to painful stimuli.  No CPR was given.  Patient received no chest compressions.  They said that she may have urinated on herself.  It does not appear as though she was choking. Caregiver at the facility says that otherwise patient has been in her normal state of health and has not been complaining of anything new today or recently.  Says that she has not been on any new medications or antibiotics.  History limited by dementia. Level 5 caveat applies.     Past Medical History:  Diagnosis Date  . Hypercholesteremia   . Hypertension     Patient Active Problem List   Diagnosis Date Noted  . A-fib ([place]) 03/19/2021  . Hypokalemia 03/18/2021  . Leucocytosis 03/18/2021  . Acute metabolic encephalopathy 10/93/2355  . Acute hypoxemic respiratory failure due to COVID-19 ([place]) 09/04/2019  . Seizure ([place])  06/19/2019  . Hyponatremia 06/18/2019  . AKI (acute kidney injury) ([place]) 06/18/2019  . Dementia ([place]) 06/18/2019  . Essential hypertension 06/18/2019  . Hyperlipidemia 06/18/2019    No past surgical history on file.   OB History   No obstetric history on file.     Family History  Problem Relation Age of Onset  . Hypertension Mother   . Hypertension Father     Social History   Tobacco Use  . Smoking status: Former Research scientist (life sciences)  . Smokeless tobacco: Never Used  Vaping Use  . Vaping Use: Never used  Substance Use Topics  . Alcohol use: Not Currently  . Drug use: Not Currently    Home Medications Prior to Admission medications   Medication Sig Start Date End Date Taking? Authorizing Provider  acetaminophen (TYLENOL) 325 MG tablet Take 650 mg by mouth 2 (two) times daily.   Yes [provider]  alum & mag hydroxide-simeth (MAALOX/MYLANTA) 200-200-20 MG/5ML suspension Take 20 mLs by mouth 3 (three) times daily as needed for indigestion.   Yes [provider]  amLODipine (NORVASC) 5 MG tablet Take 5 mg by mouth daily.   Yes [provider]  apixaban (ELIQUIS) 5 MG TABS tablet Take 1 tablet (5 mg total) by mouth 2 (two) times daily. 06/23/19  Yes Earlene Plater, MD  diclofenac Sodium (VOLTAREN) 1 % GEL Apply 4 g topically 4 (four) times daily as needed (right knee pain).   Yes [provider]  diphenhydrAMINE-zinc acetate (  BENADRYL ITCH STOPPING) cream Apply 1 application topically 2 (two) times daily. Apply to rash on chest and back   Yes [provider]  donepezil (ARICEPT) 5 MG tablet Take 5 mg by mouth at bedtime.   Yes [provider]  hydrochlorothiazide (HYDRODIURIL) 25 MG tablet Take 25 mg by mouth every morning.   Yes [provider]  hydrOXYzine (ATARAX/VISTARIL) 25 MG tablet Take 25 mg by mouth 3 (three) times daily. For itching   Yes [provider]  ivermectin (STROMECTOL) 3 MG TABS tablet Take 12 mg  by mouth every Wednesday.   Yes [provider]  levETIRAcetam (KEPPRA) 500 MG tablet Take 1 tablet (500 mg total) by mouth 2 (two) times daily. 06/25/19  Yes Earlene Plater, MD  lisinopril (ZESTRIL) 40 MG tablet Take 40 mg by mouth daily.   Yes [provider]  loperamide (IMODIUM) 2 MG capsule Take 2 mg by mouth as needed for diarrhea or loose stools (not to exceed 8 doses in 24 hours).   Yes [provider]  loratadine (CLARITIN) 10 MG tablet Take 10 mg by mouth daily.   Yes [provider]  lovastatin (MEVACOR) 40 MG tablet Take 40 mg by mouth at bedtime.  06/17/19  Yes [provider]  magnesium hydroxide (MILK OF MAGNESIA) 400 MG/5ML suspension Take 30 mLs by mouth at bedtime as needed (constipation).   Yes [provider]  Melatonin-Theanine 10-5.5 MG TABS Take 1 tablet by mouth at bedtime.   Yes [provider]  metoprolol tartrate (LOPRESSOR) 50 MG tablet Take 50 mg by mouth 2 (two) times daily.   Yes [provider]  ondansetron (ZOFRAN) 4 MG tablet Take 4 mg by mouth every 6 (six) hours as needed for nausea or vomiting.   Yes [provider]  traMADol (ULTRAM) 50 MG tablet Take 50 mg by mouth 2 (two) times daily as needed (pain).   Yes [provider]  traZODone (DESYREL) 50 MG tablet Take 50 mg by mouth at bedtime.   Yes [provider]    Allergies    Patient has no known allergies.  Review of Systems   Review of Systems  Constitutional: Negative for chills and fever.  HENT: Negative for ear pain and sore throat.   Eyes: Negative for pain and visual disturbance.  Respiratory: Negative for cough and shortness of breath.   Cardiovascular: Negative for chest pain and palpitations.  Gastrointestinal: Negative for abdominal pain and vomiting.  Genitourinary: Negative for dysuria and hematuria.  Musculoskeletal: Negative for arthralgias and back pain.  Skin: Negative for color change  and rash.  Neurological: Positive for weakness (generalized, chronic). Negative for seizures and syncope.  All other systems reviewed and are negative.   Physical Exam Updated Vital Signs BP 112/79   Pulse 90   Temp 99.5 F (37.5 C) (Rectal)   Resp (!) 24   SpO2 93%   Physical Exam Vitals and nursing note reviewed. Exam conducted with a chaperone present.  Constitutional:      General: She is not in acute distress.    Appearance: She is well-developed and overweight. She is ill-appearing (chronically).  HENT:     Head: Normocephalic and atraumatic.     Mouth/Throat:     Lips: No lesions.     Mouth: Mucous membranes are dry.     Pharynx: Oropharynx is clear.  Eyes:     Extraocular Movements: Extraocular movements intact.     Conjunctiva/sclera: Conjunctivae normal.  Pupils: Pupils are equal.     Right eye: Pupil is round and reactive.     Left eye: Pupil is round and reactive.  Cardiovascular:     Rate and Rhythm: Normal rate and regular rhythm.     Pulses:          Radial pulses are 2+ on the right side and 2+ on the left side.     Heart sounds: No murmur heard.   Pulmonary:     Effort: Pulmonary effort is normal. No tachypnea, accessory muscle usage or respiratory distress.     Breath sounds: Normal breath sounds. No decreased air movement or transmitted upper airway sounds. No wheezing.  Chest:     Comments:  No external evidence of trauma to the chest. No TTP about the chest wall. Abdominal:     General: Abdomen is flat.     Palpations: Abdomen is soft.     Tenderness: There is no abdominal tenderness.  Musculoskeletal:     Cervical back: Full passive range of motion without pain, normal range of motion and neck supple. No rigidity. No pain with movement, spinous process tenderness or muscular tenderness.     Right lower leg: No edema.     Left lower leg: No edema.     Comments:  No obvious joint deformities or swelling.  Skin:    General: Skin is warm and  dry.     Comments:  Excoriations on her anterior shoulder bilaterally as well as chest and back with scabbing. Appear to be healing.  Neurological:     Mental Status: She is alert. She is confused.     GCS: GCS eye subscore is 4. GCS verbal subscore is 5. GCS motor subscore is 6.     Cranial Nerves: Dysarthria (somewhat slurred speech though patient can improve with effort. Reported as BL when described to caregiver at facility.) present. No cranial nerve deficit or facial asymmetry.     Sensory: Sensation is intact. No sensory deficit.     Motor: Motor function is intact. No tremor, abnormal muscle tone, seizure activity or pronator drift.     Coordination: Finger-Nose-Finger Test normal.     Comments:  Generalized and symmetric weakness 4/5 in upper extremities. 3/5 strength in lower extremities, symmetric.   Psychiatric:        Behavior: Behavior is cooperative.     ED Results / Procedures / Treatments   Labs (all labs ordered are listed, but only abnormal results are displayed) Labs Reviewed  COMPREHENSIVE METABOLIC PANEL - Abnormal; Notable for the following components:      Result Value   Sodium 133 (*)    Potassium 2.7 (*)    Glucose, Bld 145 (*)    Creatinine, Ser 1.06 (*)    Calcium 8.5 (*)    Total Protein 5.9 (*)    Albumin 3.3 (*)    Total Bilirubin 1.3 (*)    GFR, Estimated 54 (*)    All other components within normal limits  CBC WITH DIFFERENTIAL/PLATELET - Abnormal; Notable for the following components:   WBC 26.9 (*)    Neutro Abs 22.9 (*)    Monocytes Absolute 2.3 (*)    Abs Immature Granulocytes 0.41 (*)    All other components within normal limits  URINALYSIS, COMPLETE (UACMP) WITH MICROSCOPIC - Abnormal; Notable for the following components:   APPearance HAZY (*)    Hgb urine dipstick SMALL (*)    Protein, ur 30 (*)    Leukocytes,Ua  MODERATE (*)    Bacteria, UA RARE (*)    All other components within normal limits  LACTIC ACID, PLASMA - Abnormal;  Notable for the following components:   Lactic Acid, Venous 2.6 (*)    All other components within normal limits  LACTIC ACID, PLASMA - Abnormal; Notable for the following components:   Lactic Acid, Venous 2.6 (*)    All other components within normal limits  CBC - Abnormal; Notable for the following components:   WBC 27.8 (*)    All other components within normal limits  COMPREHENSIVE METABOLIC PANEL - Abnormal; Notable for the following components:   Sodium 133 (*)    Potassium 3.3 (*)    Glucose, Bld 103 (*)    Total Protein 6.1 (*)    Albumin 3.3 (*)    All other components within normal limits  RESP PANEL BY RT-PCR (FLU A&B, COVID) ARPGX2  CULTURE, BLOOD (ROUTINE X 2)  CULTURE, BLOOD (ROUTINE X 2)  URINE CULTURE  RAPID URINE DRUG SCREEN, HOSP PERFORMED  LEVETIRACETAM LEVEL  CBG MONITORING, ED  I-STAT VENOUS BLOOD GAS, ED    EKG EKG Interpretation  Date/Time:  Saturday March 18 2021 20:27:50 EDT Ventricular Rate:  77 PR Interval:    QRS Duration: 102 QT Interval:  504 QTC Calculation: 571 R Axis:   68 Text Interpretation: Atrial fibrillation Minimal ST depression, diffuse leads Prolonged QT interval Confirmed by Sherwood Gambler [phone]) on 03/18/2021 9:28:06 PM   Radiology CT HEAD WO CONTRAST  Result Date: 03/18/2021 CLINICAL DATA:  Altered mental status EXAM: CT HEAD WITHOUT CONTRAST TECHNIQUE: Contiguous axial images were obtained from the base of the skull through the vertex without intravenous contrast. COMPARISON:  Head CT dated 09/15/2020 FINDINGS: Brain: Generalized parenchymal volume loss with commensurate dilatation of the ventricles and sulci. Ventricles are stable in size and configuration. 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 Chronic small vessel ischemic changes again noted within the bilateral periventricular and subcortical white matter regions. No mass, hemorrhage, edema or other evidence of acute parenchymal abnormality.  No extra-axial hemorrhage. Vascular: Chronic calcified atherosclerotic changes of the large vessels at the skull base. No unexpected hyperdense vessel. Skull: Normal. Negative for fracture or focal lesion. Sinuses/Orbits: No acute finding. Other: None. IMPRESSION: 1. No acute findings. No intracranial mass, hemorrhage or edema. 2. 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 3. Chronic small vessel ischemic changes in the white matter. Electronically Signed   By: Franki Cabot M.D.   On: 03/18/2021 22:30   DG Chest Port 1 View  Result Date: 03/18/2021 CLINICAL DATA:  Altered mental status EXAM: PORTABLE CHEST 1 VIEW COMPARISON:  Chest x-rays dated 09/04/2019 and 06/18/2019. FINDINGS: Heart size and mediastinal contours are stable. Lungs are clear. No pleural effusion or pneumothorax is seen. No acute-appearing osseous abnormality. IMPRESSION: No active disease. No evidence of pneumonia or pulmonary edema. Electronically Signed   By: Franki Cabot M.D.   On: 03/18/2021 22:31    Procedures Procedures   Medications Ordered in ED Medications  pravastatin (PRAVACHOL) tablet 40 mg (has no administration in time range)  apixaban (ELIQUIS) tablet 5 mg (5 mg Oral Given 03/19/21 0957)  levETIRAcetam (KEPPRA) tablet 500 mg (500 mg Oral Given 03/19/21 0958)  acetaminophen (TYLENOL) tablet 650 mg (650 mg Oral Given 03/19/21 0957)  alum & mag hydroxide-simeth (MAALOX/MYLANTA) 200-200-20 MG/5ML suspension 20 mL (has no administration in time range)  diclofenac Sodium (VOLTAREN) 1 % topical gel 4 g (has no administration in time range)  donepezil (ARICEPT) tablet 5 mg (has no administration in time range)  loperamide (IMODIUM) capsule 2 mg (has no administration in time range)  amLODipine (NORVASC) tablet 5 mg (5 mg Oral Given 03/19/21 0958)  diphenhydrAMINE-zinc acetate (BENADRYL) 2-5.8 % cream 1 application (1 application Topical Given 03/19/21 0958)  hydrOXYzine (ATARAX/VISTARIL) tablet 25 mg (25 mg Oral Given 03/19/21  0957)  lisinopril (ZESTRIL) tablet 40 mg (40 mg Oral Given 03/19/21 0957)  loratadine (CLARITIN) tablet 10 mg (10 mg Oral Given 03/19/21 0958)  melatonin tablet 10 mg (has no administration in time range)  metoprolol tartrate (LOPRESSOR) tablet 50 mg (50 mg Oral Given 03/19/21 0957)  magnesium hydroxide (MILK OF MAGNESIA) suspension 30 mL (has no administration in time range)  traMADol (ULTRAM) tablet 50 mg (has no administration in time range)  traZODone (DESYREL) tablet 50 mg (has no administration in time range)  0.9 % NaCl with KCl 40 mEq / L  infusion ( Intravenous Rate/Dose Change 03/19/21 0828)  ondansetron (ZOFRAN) tablet 4 mg (has no administration in time range)    Or  ondansetron (ZOFRAN) injection 4 mg (has no administration in time range)  potassium chloride SA (KLOR-CON) CR tablet 40 mEq (40 mEq Oral Given 03/19/21 0957)  cefTRIAXone (ROCEPHIN) 2 g in sodium chloride 0.9 % 100 mL IVPB (0 g Intravenous Stopped 03/19/21 1437)  cetaphil lotion ( Topical Given 03/19/21 1421)  lactated ringers bolus 1,000 mL (0 mLs Intravenous Stopped 03/19/21 0244)  cefTRIAXone (ROCEPHIN) 2 g in sodium chloride 0.9 % 100 mL IVPB (0 g Intravenous Stopped 03/19/21 0007)  lactated ringers bolus 500 mL (0 mLs Intravenous Stopped 03/19/21 0345)  potassium chloride 10 mEq in 100 mL IVPB (0 mEq Intravenous Stopped 03/19/21 0448)    ED Course  I have reviewed the triage vital signs and the nursing notes.  Pertinent labs & imaging results that were available during my care of the patient were reviewed by me and considered in my medical decision making (see chart for details).  Clinical Course as of 03/19/21 1546  Sat Mar 18, 2021  2113 DG Chest 1 View [ZB]  2136 Has been borderline hypotensive since arrival to the ED. Has a leukocytosis up to 27. Could have been a seizure however with an unclear history and limited exam 2/2 dementia will empirically give abx and workup for sepsis of unclear origin. [ZB]  2139 Reviewed past  culture data - has had previous UTIs that were largely pan-sensitive other than pcn analogues. No risk factors or previous history of MRSA. Ordered rocephin only for now pending further w/u and can add on additional coverage if needed. [ZB]  2217 Comprehensive metabolic panel(!!) Hypokalemic. Unclear cause. Facility mentioned she may have been having diarrhea. I added on a magnesium and ordered for PO and IV replacement [ZB]    Clinical Course User Index [ZB] Pearson Grippe, DO   MDM Rules/Calculators/A&P                          This is a 80 year old female with history as above who presented to the emergency department via EMS sent from nursing facility with altered mental status. After discussion with caregiver at her facility, her mental and neurologic exam for me is her baseline.  It sounds like could have possibly just been sleeping and that was what seemed to represent unresponsiveness. However, still considering seizure, arrhythmia, metabolic/electrolyte derangement. History and exam does not suggestive of a particular etiology for an infection  however will evaluate with chest x-ray and urinalysis.  Please see clinical course above for further medical decision making and ED course.  Admitted to medicine for sepsis of unclear origin, likely urine.   Final Clinical Impression(s) / ED Diagnoses Final diagnoses:  Altered mental status, unspecified altered mental status type  Hypokalemia  Sepsis without acute organ dysfunction, due to unspecified organism [facility])    Rx / [place] Orders ED Discharge Orders    None       Pearson Grippe, DO 03/19/21 1547    Sherwood Gambler, MD 03/21/21 1819

## 2021-03-18 NOTE — H&P (Signed)
History and Physical   Kendra Charles ZOX:[id number] DOB: [birth year] DOA: 03/18/2021  Referring MD/NP/PA: Dr. Langston Masker  PCP: Patient, No Pcp Per (Inactive)   Outpatient Specialists: None  Patient coming from: Skilled nursing facility  Chief Complaint: Altered mental status  HPI: Kendra Charles is a 80 y.o. female with medical history significant of dementia, essential hypertension, hyperlipidemia, seizure disorder, previous COVID-19 infection and hyponatremia among other things who was brought in from skilled nursing facility today secondary to worsening mental status.  Patient was confused.  She was not noted to be having any seizure disorder.  She is on Keppra for her seizures and has been taking it.  She was noted to have overall weakness with her confusion.  Patient came to the ER where she appears to be more stable.  Still confused but hard of hearing.  Not able to give any coherent history.  Work-up ongoing.  UTI suspected on urinalysis currently pending patient has significant low potassium of 2.7.  Also AKI with creatinine 1.06.  Baseline is less than 0.8.  She has leukocytosis with a white count of 27,000.  Lactic acid is 2.6.  Patient seen and evaluated and being admitted to the hospital for further evaluation.  ED Course: Temperature is 97.8, blood pressure 97/73, pulse 80 respirate 23 and oxygen sats 93% room air.  Sodium is 133, potassium 2.7, chloride 100.  CO2 24, glucose 145 BUN 16 creatinine 1.06 and calcium 8.5.  Albumin 3.3.  Total bilirubin 1.3.  White count 26.9 the rest of the CBC within normal.  Urinalysis currently pending.  COVID-19 screen pending.  Patient being admitted with possible early sepsis leading to all acute metabolic encephalopathy  Review of Systems: As per HPI otherwise 10 point review of systems negative.    Past Medical History:  Diagnosis Date  . Hypercholesteremia   . Hypertension     No past surgical history on file.   reports that she has quit  smoking. She has never used smokeless tobacco. She reports previous alcohol use. She reports previous drug use.  No Known Allergies  Family History  Problem Relation Age of Onset  . Hypertension Mother   . Hypertension Father      Prior to Admission medications   Medication Sig Start Date End Date Taking? Authorizing Provider  acetaminophen (TYLENOL) 325 MG tablet Take 650 mg by mouth 2 (two) times daily.   Yes [provider]  alum & mag hydroxide-simeth (MAALOX/MYLANTA) 200-200-20 MG/5ML suspension Take 20 mLs by mouth 3 (three) times daily as needed for indigestion.   Yes [provider]  amLODipine (NORVASC) 5 MG tablet Take 5 mg by mouth daily.   Yes [provider]  apixaban (ELIQUIS) 5 MG TABS tablet Take 1 tablet (5 mg total) by mouth 2 (two) times daily. 06/23/19  Yes Earlene Plater, MD  diclofenac Sodium (VOLTAREN) 1 % GEL Apply 4 g topically 4 (four) times daily as needed (right knee pain).   Yes [provider]  diphenhydrAMINE-zinc acetate (BENADRYL ITCH STOPPING) cream Apply 1 application topically 2 (two) times daily. Apply to rash on chest and back   Yes [provider]  donepezil (ARICEPT) 5 MG tablet Take 5 mg by mouth at bedtime.   Yes [provider]  hydrochlorothiazide (HYDRODIURIL) 25 MG tablet Take 25 mg by mouth every morning.   Yes [provider]  hydrOXYzine (ATARAX/VISTARIL) 25 MG tablet Take 25 mg by mouth 3 (three) times daily. For itching   Yes [provider]  ivermectin (STROMECTOL) 3 MG TABS tablet Take 12 mg by mouth every Wednesday.   Yes [provider]  levETIRAcetam (KEPPRA) 500 MG tablet Take 1 tablet (500 mg total) by mouth 2 (two) times daily. 06/25/19  Yes Earlene Plater, MD  lisinopril (ZESTRIL) 40 MG tablet Take 40 mg by mouth daily.   Yes [provider]  loperamide (IMODIUM) 2 MG capsule Take 2 mg by mouth as needed for diarrhea or loose stools (not to  exceed 8 doses in 24 hours).   Yes [provider]  loratadine (CLARITIN) 10 MG tablet Take 10 mg by mouth daily.   Yes [provider]  lovastatin (MEVACOR) 40 MG tablet Take 40 mg by mouth at bedtime.  06/17/19  Yes [provider]  magnesium hydroxide (MILK OF MAGNESIA) 400 MG/5ML suspension Take 30 mLs by mouth at bedtime as needed (constipation).   Yes [provider]  Melatonin-Theanine 10-5.5 MG TABS Take 1 tablet by mouth at bedtime.   Yes [provider]  metoprolol tartrate (LOPRESSOR) 50 MG tablet Take 50 mg by mouth 2 (two) times daily.   Yes [provider]  ondansetron (ZOFRAN) 4 MG tablet Take 4 mg by mouth every 6 (six) hours as needed for nausea or vomiting.   Yes [provider]  traMADol (ULTRAM) 50 MG tablet Take 50 mg by mouth 2 (two) times daily as needed (pain).   Yes [provider]  traZODone (DESYREL) 50 MG tablet Take 50 mg by mouth at bedtime.   Yes [provider]    Physical Exam: Vitals:   03/18/21 2100 03/18/21 2103 03/18/21 2200 03/18/21 2300  BP: 97/73  106/80 115/77  Pulse: 80  75 80  Resp: (!) 23  18 17   Temp:  97.8 F (36.6 C)    SpO2: 93%  95% 95%      Constitutional: Confused, no distress, hard of hearing Vitals:   03/18/21 2100 03/18/21 2103 03/18/21 2200 03/18/21 2300  BP: 97/73  106/80 115/77  Pulse: 80  75 80  Resp: (!) 23  18 17   Temp:  97.8 F (36.6 C)    SpO2: 93%  95% 95%   Eyes: PERRL, lids and conjunctivae normal ENMT: Mucous membranes are moist. Posterior pharynx clear of any exudate or lesions.Normal dentition.  Neck: normal, supple, no masses, no thyromegaly Respiratory: clear to auscultation bilaterally, no wheezing, no crackles. Normal respiratory effort. No accessory muscle use.  Cardiovascular: Irregularly irregular rate and rhythm, no murmurs / rubs / gallops. No extremity edema. 2+ pedal pulses. No carotid bruits.  Abdomen: no tenderness, no  masses palpated. No hepatosplenomegaly. Bowel sounds positive.  Musculoskeletal: no clubbing / cyanosis. No joint deformity upper and lower extremities. Good ROM, no contractures. Normal muscle tone.  Skin: no rashes, lesions, ulcers. No induration Neurologic: CN 2-12 grossly intact. Sensation intact, DTR normal. Strength 5/5 in all 4.  Psychiatric: Confused, no significant distress    Labs on Admission: I have personally reviewed following labs and imaging studies  CBC: Recent Labs  Lab 03/18/21 2040  WBC 26.9*  NEUTROABS 22.9*  HGB 12.6  HCT 36.8  MCV 88.0  PLT 629   Basic Metabolic Panel: Recent Labs  Lab 03/18/21 2040  NA 133*  K 2.7*  CL 100  CO2 24  GLUCOSE 145*  BUN 16  CREATININE 1.06*  CALCIUM 8.5*   GFR: CrCl cannot be calculated (Unknown ideal weight.). Liver Function Tests: Recent Labs  Lab 03/18/21  2040  AST 25  ALT 14  ALKPHOS 67  BILITOT 1.3*  PROT 5.9*  ALBUMIN 3.3*   No results for input(s): LIPASE, AMYLASE in the last 168 hours. No results for input(s): AMMONIA in the last 168 hours. Coagulation Profile: No results for input(s): INR, PROTIME in the last 168 hours. Cardiac Enzymes: No results for input(s): CKTOTAL, CKMB, CKMBINDEX, TROPONINI in the last 168 hours. BNP (last 3 results) No results for input(s): PROBNP in the last 8760 hours. HbA1C: No results for input(s): HGBA1C in the last 72 hours. CBG: No results for input(s): GLUCAP in the last 168 hours. Lipid Profile: No results for input(s): CHOL, HDL, LDLCALC, TRIG, CHOLHDL, LDLDIRECT in the last 72 hours. Thyroid Function Tests: No results for input(s): TSH, T4TOTAL, FREET4, T3FREE, THYROIDAB in the last 72 hours. Anemia Panel: No results for input(s): VITAMINB12, FOLATE, FERRITIN, TIBC, IRON, RETICCTPCT in the last 72 hours. Urine analysis:    Component Value Date/Time   COLORURINE YELLOW 09/15/2020 [place] 09/15/2020 1655   LABSPEC 1.009 09/15/2020 1655    PHURINE 7.0 09/15/2020 1655   GLUCOSEU NEGATIVE 09/15/2020 1655   HGBUR NEGATIVE 09/15/2020 1655   BILIRUBINUR NEGATIVE 09/15/2020 1655   KETONESUR NEGATIVE 09/15/2020 1655   PROTEINUR NEGATIVE 09/15/2020 1655   NITRITE POSITIVE (A) 09/15/2020 1655   LEUKOCYTESUR MODERATE (A) 09/15/2020 1655   Sepsis Labs: @LABRCNTIP (procalcitonin:4,lacticidven:4) ) Recent Results (from the past 240 hour(s))  Resp Panel by RT-PCR (Flu A&B, Covid) Nasopharyngeal Swab     Status: None   Collection Time: 03/18/21  8:44 PM   Specimen: Nasopharyngeal Swab; Nasopharyngeal(NP) swabs in vial transport medium  Result Value Ref Range Status   SARS Coronavirus 2 by RT PCR NEGATIVE NEGATIVE Final    Comment: (NOTE) SARS-CoV-2 target nucleic acids are NOT DETECTED.  The SARS-CoV-2 RNA is generally detectable in upper respiratory specimens during the acute phase of infection. The lowest concentration of SARS-CoV-2 viral copies this assay can detect is 138 copies/mL. A negative result does not preclude SARS-Cov-2 infection and should not be used as the sole basis for treatment or other patient management decisions. A negative result may occur with  improper specimen collection/handling, submission of specimen other than nasopharyngeal swab, presence of viral mutation(s) within the areas targeted by this assay, and inadequate number of viral copies(<138 copies/mL). A negative result must be combined with clinical observations, patient history, and epidemiological information. The expected result is Negative.  Fact Sheet for Patients:  EntrepreneurPulse.com.au  Fact Sheet for Healthcare Providers:  IncredibleEmployment.be  This test is no t yet approved or cleared by the [place] FDA and  has been authorized for detection and/or diagnosis of SARS-CoV-2 by FDA under an Emergency Use Authorization (EUA). This EUA will remain  in effect (meaning this test can be used)  for the duration of the COVID-19 declaration under Section 564(b)(1) of the Act, 21 U.S.C.section 360bbb-3(b)(1), unless the authorization is terminated  or revoked sooner.       Influenza A by PCR NEGATIVE NEGATIVE Final   Influenza B by PCR NEGATIVE NEGATIVE Final    Comment: (NOTE) The Xpert Xpress SARS-CoV-2/FLU/RSV plus assay is intended as an aid in the diagnosis of influenza from Nasopharyngeal swab specimens and should not be used as a sole basis for treatment. Nasal washings and aspirates are unacceptable for Xpert Xpress SARS-CoV-2/FLU/RSV testing.  Fact Sheet for Patients: EntrepreneurPulse.com.au  Fact Sheet for Healthcare Providers: IncredibleEmployment.be  This test is not yet approved or cleared by  the [employer] and has been authorized for detection and/or diagnosis of SARS-CoV-2 by FDA under an Emergency Use Authorization (EUA). This EUA will remain in effect (meaning this test can be used) for the duration of the COVID-19 declaration under Section 564(b)(1) of the Act, 21 U.S.C. section 360bbb-3(b)(1), unless the authorization is terminated or revoked.  Performed at [facility], [place] [address], [place], [place] [address]      Radiological Exams on Admission: CT HEAD WO CONTRAST  Result Date: 03/18/2021 CLINICAL DATA:  Altered mental status EXAM: CT HEAD WITHOUT CONTRAST TECHNIQUE: Contiguous axial images were obtained from the base of the skull through the vertex without intravenous contrast. COMPARISON:  Head CT dated 09/15/2020 FINDINGS: Brain: Generalized parenchymal volume loss with commensurate dilatation of the ventricles and sulci. Ventricles are stable in size and configuration. 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 Chronic small vessel ischemic changes again noted within the bilateral periventricular and subcortical white matter regions. No mass, hemorrhage, edema or  other evidence of acute parenchymal abnormality. No extra-axial hemorrhage. Vascular: Chronic calcified atherosclerotic changes of the large vessels at the skull base. No unexpected hyperdense vessel. Skull: Normal. Negative for fracture or focal lesion. Sinuses/Orbits: No acute finding. Other: None. IMPRESSION: 1. No acute findings. No intracranial mass, hemorrhage or edema. 2. 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 3. Chronic small vessel ischemic changes in the white matter. Electronically Signed   By: Franki Cabot M.D.   On: 03/18/2021 22:30   DG Chest Port 1 View  Result Date: 03/18/2021 CLINICAL DATA:  Altered mental status EXAM: PORTABLE CHEST 1 VIEW COMPARISON:  Chest x-rays dated 09/04/2019 and 06/18/2019. FINDINGS: Heart size and mediastinal contours are stable. Lungs are clear. No pleural effusion or pneumothorax is seen. No acute-appearing osseous abnormality. IMPRESSION: No active disease. No evidence of pneumonia or pulmonary edema. Electronically Signed   By: Franki Cabot M.D.   On: 03/18/2021 22:31    EKG: Independently reviewed.  Showed atrial fibrillation with a rate of 77, prolonged QT and nonspecific ST changes  Assessment/Plan Principal Problem:   Acute metabolic encephalopathy Active Problems:   AKI (acute kidney injury) ([place])   Dementia (HCC)   Essential hypertension   Hyperlipidemia   Seizure (HCC)   Hypokalemia   Leucocytosis     #1 acute metabolic encephalopathy: Probably multifactorial.  Patient suspected to have UTI, early sepsis, possible neurologic in nature.  Patient had seizure disorder on Keppra.  Keppra level is currently pending.  We will admit the patient and continue work-up.  CVA is remotely possible especially with a history of A. Fib.  #2 seizure disorder: Continue Keppra and check level.  #3 AKI: Probably prerenal.  Hydrate and monitor renal function  #4 essential hypertension: Continue blood pressure medications namely amlodipine, lisinopril  and metoprolol.  Hold hydrochlorothiazide due to hypokalemia.  #5 hypokalemia: Potassium 2.7.  Replete.  Hold diuretics  #6 dementia: On Aricept.  Continue  #7 atrial fibrillation: Continue metoprolol and Eliquis.  #8 hyperlipidemia: Not on statin.  Continue to monitor   DVT prophylaxis: Eliquis Code Status: Full code Family Communication: No family at bedside Disposition Plan: Home Consults called: None Admission status: Inpatient  Severity of Illness: The appropriate patient status for this patient is INPATIENT. Inpatient status is judged to be reasonable and necessary in order to provide the required intensity of service to ensure the patient's safety. The patient's presenting symptoms, physical exam findings, and initial radiographic and laboratory data in the context  of their chronic comorbidities is felt to place them at high risk for further clinical deterioration. Furthermore, it is not anticipated that the patient will be medically stable for discharge from the hospital within 2 midnights of admission. The following factors support the patient status of inpatient.   " The patient's presenting symptoms include confusion with altered mental status. " The worrisome physical exam findings include altered mental status. " The initial radiographic and laboratory data are worrisome because of potassium of 2.7. " The chronic co-morbidities include dementia.   * I certify that at the point of admission it is my clinical judgment that the patient will require inpatient hospital care spanning beyond 2 midnights from the point of admission due to high intensity of service, high risk for further deterioration and high frequency of surveillance required.Barbette Merino MD [facility] Pager [phone]  If 7PM-7AM, please contact night-coverage [email or web address] Password [facility]  03/18/2021, 11:22 PM

## 2021-03-18 NOTE — ED Triage Notes (Signed)
Pt brought to ED by [facility] from [facility] with c/o AMS. Per EMS, initial dispatch called for unresponsive pt, states staff at [facility] initiated CPR and pt became responsive but not quite to baseline. Pt Alert with no active distress at triage. Able to express needs to staff at time of triage.

## 2021-03-18 NOTE — ED Notes (Signed)
Patient transported to CT 

## 2021-03-19 ENCOUNTER — Other Ambulatory Visit: Payer: Self-pay

## 2021-03-19 DIAGNOSIS — I480 Paroxysmal atrial fibrillation: Secondary | ICD-10-CM | POA: Diagnosis present

## 2021-03-19 DIAGNOSIS — I4891 Unspecified atrial fibrillation: Secondary | ICD-10-CM | POA: Diagnosis present

## 2021-03-19 LAB — CBC
HCT: 38.9 % (ref 36.0–46.0)
Hemoglobin: 12.9 g/dL (ref 12.0–15.0)
MCH: 30.2 pg (ref 26.0–34.0)
MCHC: 33.2 g/dL (ref 30.0–36.0)
MCV: 91.1 fL (ref 80.0–100.0)
Platelets: 230 10*3/uL (ref 150–400)
RBC: 4.27 MIL/uL (ref 3.87–5.11)
RDW: 13.2 % (ref 11.5–15.5)
WBC: 27.8 10*3/uL — ABNORMAL HIGH (ref 4.0–10.5)
nRBC: 0 % (ref 0.0–0.2)

## 2021-03-19 LAB — URINALYSIS, COMPLETE (UACMP) WITH MICROSCOPIC
Bilirubin Urine: NEGATIVE
Glucose, UA: NEGATIVE mg/dL
Ketones, ur: NEGATIVE mg/dL
Nitrite: NEGATIVE
Protein, ur: 30 mg/dL — AB
Specific Gravity, Urine: 1.017 (ref 1.005–1.030)
pH: 5 (ref 5.0–8.0)

## 2021-03-19 LAB — RAPID URINE DRUG SCREEN, HOSP PERFORMED
Amphetamines: NOT DETECTED
Barbiturates: NOT DETECTED
Benzodiazepines: NOT DETECTED
Cocaine: NOT DETECTED
Opiates: NOT DETECTED
Tetrahydrocannabinol: NOT DETECTED

## 2021-03-19 LAB — COMPREHENSIVE METABOLIC PANEL
ALT: 16 U/L (ref 0–44)
AST: 40 U/L (ref 15–41)
Albumin: 3.3 g/dL — ABNORMAL LOW (ref 3.5–5.0)
Alkaline Phosphatase: 73 U/L (ref 38–126)
Anion gap: 13 (ref 5–15)
BUN: 12 mg/dL (ref 8–23)
CO2: 22 mmol/L (ref 22–32)
Calcium: 9 mg/dL (ref 8.9–10.3)
Chloride: 98 mmol/L (ref 98–111)
Creatinine, Ser: 0.8 mg/dL (ref 0.44–1.00)
GFR, Estimated: >60 mL/min (ref >60)
Glucose, Bld: 103 mg/dL — ABNORMAL HIGH (ref 70–99)
Potassium: 3.3 mmol/L — ABNORMAL LOW (ref 3.5–5.1)
Sodium: 133 mmol/L — ABNORMAL LOW (ref 135–145)
Total Bilirubin: 1.2 mg/dL (ref 0.3–1.2)
Total Protein: 6.1 g/dL — ABNORMAL LOW (ref 6.5–8.1)

## 2021-03-19 MED ORDER — ACETAMINOPHEN 325 MG PO TABS
650.0000 mg | ORAL_TABLET | Freq: Two times a day (BID) | ORAL | Status: DC
  Administered 2021-03-19: 650 mg via ORAL
  Filled 2021-03-19: qty 2

## 2021-03-19 MED ORDER — CETAPHIL MOISTURIZING EX LOTN
TOPICAL_LOTION | Freq: Every day | CUTANEOUS | Status: DC
  Filled 2021-03-19: qty 473

## 2021-03-19 MED ORDER — TRAZODONE HCL 50 MG PO TABS
50.0000 mg | ORAL_TABLET | Freq: Every day | ORAL | Status: DC
  Administered 2021-03-20 – 2021-03-22 (×3): 50 mg via ORAL
  Filled 2021-03-19 (×3): qty 1

## 2021-03-19 MED ORDER — LEVETIRACETAM 500 MG PO TABS
500.0000 mg | ORAL_TABLET | Freq: Two times a day (BID) | ORAL | Status: DC
  Administered 2021-03-19 – 2021-03-23 (×9): 500 mg via ORAL
  Filled 2021-03-19 (×9): qty 1

## 2021-03-19 MED ORDER — TRAMADOL HCL 50 MG PO TABS
50.0000 mg | ORAL_TABLET | Freq: Two times a day (BID) | ORAL | Status: DC | PRN
  Administered 2021-03-21 – 2021-03-23 (×2): 50 mg via ORAL
  Filled 2021-03-19 (×2): qty 1

## 2021-03-19 MED ORDER — HYDROXYZINE HCL 25 MG PO TABS
25.0000 mg | ORAL_TABLET | Freq: Three times a day (TID) | ORAL | Status: DC
  Administered 2021-03-19 – 2021-03-23 (×14): 25 mg via ORAL
  Filled 2021-03-19 (×15): qty 1

## 2021-03-19 MED ORDER — DONEPEZIL HCL 5 MG PO TABS
5.0000 mg | ORAL_TABLET | Freq: Every day | ORAL | Status: DC
  Administered 2021-03-19 – 2021-03-22 (×4): 5 mg via ORAL
  Filled 2021-03-19 (×5): qty 1

## 2021-03-19 MED ORDER — APIXABAN 5 MG PO TABS
5.0000 mg | ORAL_TABLET | Freq: Two times a day (BID) | ORAL | Status: DC
  Administered 2021-03-19 – 2021-03-23 (×9): 5 mg via ORAL
  Filled 2021-03-19 (×9): qty 1

## 2021-03-19 MED ORDER — PRAVASTATIN SODIUM 40 MG PO TABS
40.0000 mg | ORAL_TABLET | Freq: Every day | ORAL | Status: DC
  Administered 2021-03-19 – 2021-03-22 (×4): 40 mg via ORAL
  Filled 2021-03-19 (×5): qty 1

## 2021-03-19 MED ORDER — POTASSIUM CHLORIDE CRYS ER 20 MEQ PO TBCR
40.0000 meq | EXTENDED_RELEASE_TABLET | Freq: Three times a day (TID) | ORAL | Status: AC
  Administered 2021-03-19 (×3): 40 meq via ORAL
  Filled 2021-03-19 (×3): qty 2

## 2021-03-19 MED ORDER — ONDANSETRON HCL 4 MG/2ML IJ SOLN: 4.0000 mg | Freq: Four times a day (QID) | INTRAMUSCULAR | Status: DC | PRN

## 2021-03-19 MED ORDER — MAGNESIUM HYDROXIDE 400 MG/5ML PO SUSP
30.0000 mL | Freq: Every evening | ORAL | Status: DC | PRN
  Filled 2021-03-19: qty 30

## 2021-03-19 MED ORDER — DIPHENHYDRAMINE-ZINC ACETATE 2-0.1 % EX CREA
1.0000 "application " | TOPICAL_CREAM | Freq: Two times a day (BID) | CUTANEOUS | Status: DC
  Administered 2021-03-19 – 2021-03-23 (×8): 1 via TOPICAL
  Filled 2021-03-19: qty 28

## 2021-03-19 MED ORDER — ONDANSETRON HCL 4 MG PO TABS: 4.0000 mg | ORAL_TABLET | Freq: Four times a day (QID) | ORAL | Status: DC | PRN

## 2021-03-19 MED ORDER — SODIUM CHLORIDE 0.9 % IV SOLN
2.0000 g | INTRAVENOUS | Status: AC
  Administered 2021-03-19 – 2021-03-20 (×2): 2 g via INTRAVENOUS
  Filled 2021-03-19 (×2): qty 20

## 2021-03-19 MED ORDER — AMLODIPINE BESYLATE 5 MG PO TABS
5.0000 mg | ORAL_TABLET | Freq: Every day | ORAL | Status: DC
  Administered 2021-03-19 – 2021-03-23 (×5): 5 mg via ORAL
  Filled 2021-03-19 (×5): qty 1

## 2021-03-19 MED ORDER — POTASSIUM CHLORIDE IN NACL 40-0.9 MEQ/L-% IV SOLN
INTRAVENOUS | Status: AC
  Filled 2021-03-19 (×2): qty 1000

## 2021-03-19 MED ORDER — LOPERAMIDE HCL 2 MG PO CAPS: 2.0000 mg | ORAL_CAPSULE | ORAL | Status: DC | PRN

## 2021-03-19 MED ORDER — METOPROLOL TARTRATE 50 MG PO TABS
50.0000 mg | ORAL_TABLET | Freq: Two times a day (BID) | ORAL | Status: DC
  Administered 2021-03-19 – 2021-03-23 (×9): 50 mg via ORAL
  Filled 2021-03-19 (×2): qty 1
  Filled 2021-03-19: qty 2
  Filled 2021-03-19 (×6): qty 1

## 2021-03-19 MED ORDER — LORATADINE 10 MG PO TABS
10.0000 mg | ORAL_TABLET | Freq: Every day | ORAL | Status: DC
  Administered 2021-03-19 – 2021-03-23 (×5): 10 mg via ORAL
  Filled 2021-03-19 (×4): qty 1

## 2021-03-19 MED ORDER — ACETAMINOPHEN 325 MG PO TABS
325.0000 mg | ORAL_TABLET | ORAL | Status: DC | PRN
  Administered 2021-03-19 – 2021-03-23 (×3): 325 mg via ORAL
  Filled 2021-03-19 (×3): qty 1

## 2021-03-19 MED ORDER — MELATONIN 5 MG PO TABS
10.0000 mg | ORAL_TABLET | Freq: Every day | ORAL | Status: DC
  Administered 2021-03-19 – 2021-03-22 (×4): 10 mg via ORAL
  Filled 2021-03-19 (×4): qty 2

## 2021-03-19 MED ORDER — DICLOFENAC SODIUM 1 % EX GEL: 4.0000 g | Freq: Four times a day (QID) | CUTANEOUS | Status: DC | PRN

## 2021-03-19 MED ORDER — LISINOPRIL 40 MG PO TABS
40.0000 mg | ORAL_TABLET | Freq: Every day | ORAL | Status: DC
  Administered 2021-03-19 – 2021-03-23 (×5): 40 mg via ORAL
  Filled 2021-03-19: qty 2
  Filled 2021-03-19 (×4): qty 1

## 2021-03-19 MED ORDER — IVERMECTIN 3 MG PO TABS: 12.0000 mg | ORAL_TABLET | ORAL | Status: DC

## 2021-03-19 MED ORDER — ALUM & MAG HYDROXIDE-SIMETH 200-200-20 MG/5ML PO SUSP: 20.0000 mL | Freq: Three times a day (TID) | ORAL | Status: DC | PRN

## 2021-03-19 NOTE — ED Notes (Signed)
Pt status given to [facility] staff.

## 2021-03-19 NOTE — ED Notes (Signed)
Pt brought over to be by previous Therapist, sports.Pt covered in blood. Pt IV is not working. Pt is kicking and yelling cuss words at this RN.Pt was told that this RN is trying to get her cleaned up. Pt told this RN multiple times to get off of her.

## 2021-03-19 NOTE — ED Notes (Signed)
Tele  Breakfast Ordered 

## 2021-03-19 NOTE — Progress Notes (Signed)
PROGRESS NOTE    Kendra Charles  CWC:[id number] DOB: 1941/08/18 DOA: 03/18/2021 PCP: Patient, No Pcp Per (Inactive)   Brief Narrative:   Kendra Charles is a 80 y.o. female with medical history significant of dementia, essential hypertension, hyperlipidemia, seizure disorder, previous COVID-19 infection and hyponatremia among other things who was brought in from skilled nursing facility today secondary to acute mental status changes.  No seizure-like activity was noted, some complaints of increased urinary frequency and dysuria at intake concerning for UTI.  Assessment & Plan:   Principal Problem:   Acute metabolic encephalopathy Active Problems:   AKI (acute kidney injury) ([place])   Dementia ([place])   Essential hypertension   Hyperlipidemia   Seizure (HCC)   Hypokalemia   Leucocytosis   A-fib (HCC)   Sepsis, severe secondary to UTI, POA  Concurrent acute metabolic encephalopathy:  -Continue ceftriaxone, IV fluids  -Trend lactic acidosis/leukocyte/electrolyte abnormalities  -UDS unremarkable -CT head unremarkable for any acute findings, old infarct noted  Seizure disorder: Continue Keppra -Keppra level pending.  AKI:  -Likely prerenal in the setting of poor p.o. intake, increased urine output as above -Continue IV fluids  Essential hypertension -Continue amlodipine, lisinopril and metoprolol.   -Hydrochlorothiazide held in the setting of hypokalemia.  Hypokalemia:  Potassium 2.7 at intake, continue repletion as necessary repeat 3.3 this morning  Dementia:  Continue home Aricept Baseline is awake alert oriented to person and general situation, conversive, can have meaningful conversation but is not aware of current events   A-fibrillation:  Continue metoprolol and Eliquis.  Hyperlipidemia:  Not on statin questionably secondary to intolerance.  Continue to monitor  DVT prophylaxis: Eliquis Code Status: Full code Family Communication: Son Rob at  bedside  Status is: Inpatient  Dispo: The patient is from: Facility              Anticipated d/c is to: Same              Anticipated d/c date is: 24 to 48 hours              Patient currently not medically stable for discharge  Consultants:   None  Procedures:   None  Antimicrobials:  Ceftriaxone x3 days  Subjective: No acute issues or events overnight, review of systems somewhat limited given patient's mental status but appears to be improving per son at bedside  Objective: Vitals:   03/19/21 0350 03/19/21 0515 03/19/21 0645 03/19/21 0700  BP:      Pulse: (!) 112 99 96 100  Resp: (!) 28 (!) 21 (!) 26 (!) 25  Temp:    99.5 F (37.5 C)  TempSrc:    Rectal  SpO2: 94% 100% 97% 96%    Intake/Output Summary (Last 24 hours) at 03/19/2021 0708 Last data filed at 03/19/2021 0244 Gross per 24 hour  Intake 5784.44 ml  Output --  Net 5784.44 ml   There were no vitals filed for this visit.  Examination:  General exam: Appears calm and comfortable  Respiratory system: Clear to auscultation. Respiratory effort normal. Cardiovascular system: S1 & S2 heard, RRR. No JVD, murmurs, rubs, gallops or clicks. No pedal edema. Gastrointestinal system: Abdomen is nondistended, soft and nontender. No organomegaly or masses felt. Normal bowel sounds heard. Central nervous system: Alert and oriented. No focal neurological deficits. Extremities: Symmetric 5 x 5 power. Skin: Multiple excoriations over her upper extremities chest and abdomen but no overt rash noted  Data Reviewed: I have personally reviewed following labs and imaging studies  CBC: Recent Labs  Lab 03/18/21 2040  WBC 26.9*  NEUTROABS 22.9*  HGB 12.6  HCT 36.8  MCV 88.0  PLT 350   Basic Metabolic Panel: Recent Labs  Lab 03/18/21 2040  NA 133*  K 2.7*  CL 100  CO2 24  GLUCOSE 145*  BUN 16  CREATININE 1.06*  CALCIUM 8.5*   GFR: CrCl cannot be calculated (Unknown ideal weight.). Liver Function  Tests: Recent Labs  Lab 03/18/21 2040  AST 25  ALT 14  ALKPHOS 67  BILITOT 1.3*  PROT 5.9*  ALBUMIN 3.3*   No results for input(s): LIPASE, AMYLASE in the last 168 hours. No results for input(s): AMMONIA in the last 168 hours. Coagulation Profile: No results for input(s): INR, PROTIME in the last 168 hours. Cardiac Enzymes: No results for input(s): CKTOTAL, CKMB, CKMBINDEX, TROPONINI in the last 168 hours. BNP (last 3 results) No results for input(s): PROBNP in the last 8760 hours. HbA1C: No results for input(s): HGBA1C in the last 72 hours. CBG: No results for input(s): GLUCAP in the last 168 hours. Lipid Profile: No results for input(s): CHOL, HDL, LDLCALC, TRIG, CHOLHDL, LDLDIRECT in the last 72 hours. Thyroid Function Tests: No results for input(s): TSH, T4TOTAL, FREET4, T3FREE, THYROIDAB in the last 72 hours. Anemia Panel: No results for input(s): VITAMINB12, FOLATE, FERRITIN, TIBC, IRON, RETICCTPCT in the last 72 hours. Sepsis Labs: Recent Labs  Lab 03/18/21 2040 03/18/21 2151  LATICACIDVEN 2.6* 2.6*    Recent Results (from the past 240 hour(s))  Resp Panel by RT-PCR (Flu A&B, Covid) Nasopharyngeal Swab     Status: None   Collection Time: 03/18/21  8:44 PM   Specimen: Nasopharyngeal Swab; Nasopharyngeal(NP) swabs in vial transport medium  Result Value Ref Range Status   SARS Coronavirus 2 by RT PCR NEGATIVE NEGATIVE Final    Comment: (NOTE) SARS-CoV-2 target nucleic acids are NOT DETECTED.  The SARS-CoV-2 RNA is generally detectable in upper respiratory specimens during the acute phase of infection. The lowest concentration of SARS-CoV-2 viral copies this assay can detect is 138 copies/mL. A negative result does not preclude SARS-Cov-2 infection and should not be used as the sole basis for treatment or other patient management decisions. A negative result may occur with  improper specimen collection/handling, submission of specimen other than nasopharyngeal  swab, presence of viral mutation(s) within the areas targeted by this assay, and inadequate number of viral copies(<138 copies/mL). A negative result must be combined with clinical observations, patient history, and epidemiological information. The expected result is Negative.  Fact Sheet for Patients:  EntrepreneurPulse.com.au  Fact Sheet for Healthcare Providers:  IncredibleEmployment.be  This test is no t yet approved or cleared by the [place] FDA and  has been authorized for detection and/or diagnosis of SARS-CoV-2 by FDA under an Emergency Use Authorization (EUA). This EUA will remain  in effect (meaning this test can be used) for the duration of the COVID-19 declaration under Section 564(b)(1) of the Act, 21 U.S.C.section 360bbb-3(b)(1), unless the authorization is terminated  or revoked sooner.       Influenza A by PCR NEGATIVE NEGATIVE Final   Influenza B by PCR NEGATIVE NEGATIVE Final    Comment: (NOTE) The Xpert Xpress SARS-CoV-2/FLU/RSV plus assay is intended as an aid in the diagnosis of influenza from Nasopharyngeal swab specimens and should not be used as a sole basis for treatment. Nasal washings and aspirates are unacceptable for Xpert Xpress SARS-CoV-2/FLU/RSV testing.  Fact Sheet for Patients: EntrepreneurPulse.com.au  Fact Sheet for  Healthcare Providers: IncredibleEmployment.be  This test is not yet approved or cleared by the [place] and has been authorized for detection and/or diagnosis of SARS-CoV-2 by FDA under an Emergency Use Authorization (EUA). This EUA will remain in effect (meaning this test can be used) for the duration of the COVID-19 declaration under Section 564(b)(1) of the Act, 21 U.S.C. section 360bbb-3(b)(1), unless the authorization is terminated or revoked.  Performed at [facility], [place] [address], [place], [place] [address]           Radiology Studies: CT HEAD WO CONTRAST  Result Date: 03/18/2021 CLINICAL DATA:  Altered mental status EXAM: CT HEAD WITHOUT CONTRAST TECHNIQUE: Contiguous axial images were obtained from the base of the skull through the vertex without intravenous contrast. COMPARISON:  Head CT dated 09/15/2020 FINDINGS: Brain: Generalized parenchymal volume loss with commensurate dilatation of the ventricles and sulci. Ventricles are stable in size and configuration. 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 Chronic small vessel ischemic changes again noted within the bilateral periventricular and subcortical white matter regions. No mass, hemorrhage, edema or other evidence of acute parenchymal abnormality. No extra-axial hemorrhage. Vascular: Chronic calcified atherosclerotic changes of the large vessels at the skull base. No unexpected hyperdense vessel. Skull: Normal. Negative for fracture or focal lesion. Sinuses/Orbits: No acute finding. Other: None. IMPRESSION: 1. No acute findings. No intracranial mass, hemorrhage or edema. 2. 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 3. Chronic small vessel ischemic changes in the white matter. Electronically Signed   By: Franki Cabot M.D.   On: 03/18/2021 22:30   DG Chest Port 1 View  Result Date: 03/18/2021 CLINICAL DATA:  Altered mental status EXAM: PORTABLE CHEST 1 VIEW COMPARISON:  Chest x-rays dated 09/04/2019 and 06/18/2019. FINDINGS: Heart size and mediastinal contours are stable. Lungs are clear. No pleural effusion or pneumothorax is seen. No acute-appearing osseous abnormality. IMPRESSION: No active disease. No evidence of pneumonia or pulmonary edema. Electronically Signed   By: Franki Cabot M.D.   On: 03/18/2021 22:31        Scheduled Meds: . acetaminophen  650 mg Oral BID  . amLODipine  5 mg Oral Daily  . apixaban  5 mg Oral BID  . diphenhydrAMINE-zinc acetate  1 application Topical BID  . donepezil  5 mg Oral  QHS  . hydrOXYzine  25 mg Oral TID  . levETIRAcetam  500 mg Oral BID  . lisinopril  40 mg Oral Daily  . loratadine  10 mg Oral Daily  . melatonin  10 mg Oral QHS  . metoprolol tartrate  50 mg Oral BID  . potassium chloride  20 mEq Oral BID  . pravastatin  40 mg Oral q1800  . traZODone  50 mg Oral QHS   Continuous Infusions: . 0.9 % NaCl with KCl 40 mEq / L 100 mL/hr at 03/19/21 0448     LOS: 1 day   Time spent: 62min  Jazzlene Huot C Domingue Coltrain, DO [facility]  If 7PM-7AM, please contact night-coverage [email or web address]  03/19/2021, 7:08 AM

## 2021-03-19 NOTE — Plan of Care (Signed)
  Problem: Elimination: Goal: Will not experience complications related to bowel motility Outcome: Progressing   Problem: Elimination: Goal: Will not experience complications related to urinary retention Outcome: Progressing   

## 2021-03-19 NOTE — Progress Notes (Signed)
NEW ADMISSION NOTE New Admission Note:   Arrival Method:  Stretcher  Mental Orientation:  Alert to self only  Telemetry: yes Box 5 Assessment: Completed Skin: intact, ecchymosis arms  IV: Left Hand  Pain: denies  Tubes: none Safety Measures: Safety Fall Prevention Plan has been given, discussed  Admission: Completed [facility] Orientation: Patient has been orientated to the room, unit and staff.  Family: none present  Orders have been reviewed and implemented. Will continue to monitor the patient. Call light has been placed within reach and bed alarm has been activated.   Berneta Levins, RN

## 2021-03-20 ENCOUNTER — Inpatient Hospital Stay (HOSPITAL_COMMUNITY): Payer: Medicare Other

## 2021-03-20 LAB — COMPREHENSIVE METABOLIC PANEL
ALT: 23 U/L (ref 0–44)
AST: 51 U/L — ABNORMAL HIGH (ref 15–41)
Albumin: 2.7 g/dL — ABNORMAL LOW (ref 3.5–5.0)
Alkaline Phosphatase: 59 U/L (ref 38–126)
Anion gap: 9 (ref 5–15)
BUN: 17 mg/dL (ref 8–23)
CO2: 22 mmol/L (ref 22–32)
Calcium: 8.8 mg/dL — ABNORMAL LOW (ref 8.9–10.3)
Chloride: 104 mmol/L (ref 98–111)
Creatinine, Ser: 0.7 mg/dL (ref 0.44–1.00)
GFR, Estimated: >60 mL/min (ref >60)
Glucose, Bld: 108 mg/dL — ABNORMAL HIGH (ref 70–99)
Potassium: 3.6 mmol/L (ref 3.5–5.1)
Sodium: 135 mmol/L (ref 135–145)
Total Bilirubin: 1 mg/dL (ref 0.3–1.2)
Total Protein: 5.4 g/dL — ABNORMAL LOW (ref 6.5–8.1)

## 2021-03-20 LAB — BLOOD GAS, ARTERIAL
Acid-base deficit: 0.4 mmol/L (ref 0.0–2.0)
Bicarbonate: 23.4 mmol/L (ref 20.0–28.0)
FIO2: 36
O2 Saturation: 94 %
Patient temperature: 37
pCO2 arterial: 35.8 mmHg (ref 32.0–48.0)
pH, Arterial: 7.43 (ref 7.350–7.450)
pO2, Arterial: 68.5 mmHg — ABNORMAL LOW (ref 83.0–108.0)

## 2021-03-20 LAB — CBC
HCT: 35.6 % — ABNORMAL LOW (ref 36.0–46.0)
Hemoglobin: 12.1 g/dL (ref 12.0–15.0)
MCH: 30.3 pg (ref 26.0–34.0)
MCHC: 34 g/dL (ref 30.0–36.0)
MCV: 89.2 fL (ref 80.0–100.0)
Platelets: 208 10*3/uL (ref 150–400)
RBC: 3.99 MIL/uL (ref 3.87–5.11)
RDW: 13.3 % (ref 11.5–15.5)
WBC: 23.9 10*3/uL — ABNORMAL HIGH (ref 4.0–10.5)
nRBC: 0 % (ref 0.0–0.2)

## 2021-03-20 LAB — C DIFFICILE QUICK SCREEN W PCR REFLEX
C Diff antigen: NEGATIVE
C Diff interpretation: NOT DETECTED
C Diff toxin: NEGATIVE

## 2021-03-20 LAB — POCT I-STAT EG7
Acid-base deficit: 1 mmol/L (ref 0.0–2.0)
Bicarbonate: 23.2 mmol/L (ref 20.0–28.0)
Calcium, Ion: 1 mmol/L — ABNORMAL LOW (ref 1.15–1.40)
HCT: 33 % — ABNORMAL LOW (ref 36.0–46.0)
Hemoglobin: 11.2 g/dL — ABNORMAL LOW (ref 12.0–15.0)
O2 Saturation: 67 %
Potassium: 2.8 mmol/L — ABNORMAL LOW (ref 3.5–5.1)
Sodium: 138 mmol/L (ref 135–145)
TCO2: 24 mmol/L (ref 22–32)
pCO2, Ven: 34.8 mmHg — ABNORMAL LOW (ref 44.0–60.0)
pH, Ven: 7.432 — ABNORMAL HIGH (ref 7.250–7.430)
pO2, Ven: 34 mmHg (ref 32.0–45.0)

## 2021-03-20 MED ORDER — SODIUM CHLORIDE 0.9 % IV SOLN
2.0000 g | INTRAVENOUS | Status: DC
  Administered 2021-03-21 – 2021-03-23 (×3): 2 g via INTRAVENOUS
  Filled 2021-03-20 (×3): qty 20

## 2021-03-20 NOTE — Progress Notes (Signed)
PROGRESS NOTE   Kendra Charles  XBM:[id number] DOB: 06/09/1941 DOA: 03/18/2021 PCP: Patient, No Pcp Per (Inactive)   Brief Narrative:   Kendra Charles is a 80 y.o. female with medical history significant of dementia, essential hypertension, hyperlipidemia, seizure disorder, previous COVID-19 infection and hyponatremia among other things who was brought in from skilled nursing facility today secondary to acute mental status changes.  No seizure-like activity was noted, some complaints of increased urinary frequency and dysuria at intake concerning for UTI.  Assessment & Plan:   Principal Problem:   Acute metabolic encephalopathy Active Problems:   AKI (acute kidney injury) ([place])   Dementia ([place])   Essential hypertension   Hyperlipidemia   Seizure (HCC)   Hypokalemia   Leucocytosis   A-fib (HCC)   Sepsis, severe secondary to UTI, POA  Rule out C. Difficile/infectious diarrhea Concurrent acute metabolic encephalopathy resolving:  -Continue ceftriaxone, IV fluids  -Trend lactic acidosis/leukocyte/electrolyte abnormalities  -UDS unremarkable -CT head unremarkable for any acute findings, old infarct noted -Patient reports diarrhea today, soft to watery will follow C. difficile and stool testing protocol and initiate enteric precautions  Seizure disorder: - Continue Keppra -Keppra level pending  AKI, resolving:  - Likely prerenal in the setting of poor p.o. intake, increased urine output as above - Continue IV fluids  Essential hypertension: - Continue amlodipine, lisinopril and metoprolol  - Hydrochlorothiazide held in the setting of hypokalemia  Hypokalemia, resolving:  - Potassium 2.7 at admission, 3.6 today, continue to increase p.o. intake as tolerated  Dementia:  Continue home Aricept Baseline is awake alert oriented to person and general situation, conversive, can have meaningful conversation but is not aware of current events   A-fibrillation:  Continue  metoprolol and Eliquis.  Hyperlipidemia:  Not on statin questionably secondary to intolerance.  Continue to monitor  DVT prophylaxis: Eliquis Code Status: Full code Family Communication: Son Kendra Charles previously updated  Status is: Inpatient  Dispo: The patient is from: Facility              Anticipated d/c is to: Same              Anticipated d/c date is: 24 to 48 hours              Patient currently not medically stable for discharge  Consultants:   None  Procedures:   None  Antimicrobials:  Ceftriaxone x3 days  Subjective: Patient reports liquid to loose stool over the past 12 hours, low-grade fevers ongoing overnight; otherwise much more awake alert oriented denies nausea vomiting constipation headache fevers chills chest pain shortness of breath.  Objective: Vitals:   03/19/21 2045 03/19/21 2310 03/20/21 0105 03/20/21 0519  BP: 126/85 117/80 98/78 117/74  Pulse: (!) 109 94 92 90  Resp: 19 17 17 16   Temp: (!) 101.1 F (38.4 C) (!) 100.5 F (38.1 C) 99.7 F (37.6 C) 98.4 F (36.9 C)  TempSrc: Oral Oral Oral   SpO2: 92% 92% 91% 91%  Weight:      Height:        Intake/Output Summary (Last 24 hours) at 03/20/2021 0735 Last data filed at 03/20/2021 0600 Gross per 24 hour  Intake 1267.5 ml  Output 50 ml  Net 1217.5 ml   Filed Weights   03/19/21 1618  Weight: 58.7 kg    Examination:  General exam: Appears calm and comfortable  Respiratory system: Clear to auscultation. Respiratory effort normal. Cardiovascular system: S1 & S2 heard, RRR. No JVD, murmurs, rubs, gallops or  clicks. No pedal edema. Gastrointestinal system: Abdomen is nondistended, soft and nontender. No organomegaly or masses felt. Normal bowel sounds heard. Central nervous system: Alert and oriented to person and situation this morning. No focal neurological deficits. Extremities: Symmetric 5 x 5 power. Skin: Multiple excoriations over her upper extremities chest and abdomen but no overt rash  noted  Data Reviewed: I have personally reviewed following labs and imaging studies  CBC: Recent Labs  Lab 03/18/21 2040 03/19/21 0500 03/20/21 0509  WBC 26.9* 27.8* 23.9*  NEUTROABS 22.9*  --   --   HGB 12.6 12.9 12.1  HCT 36.8 38.9 35.6*  MCV 88.0 91.1 89.2  PLT 265 230 035   Basic Metabolic Panel: Recent Labs  Lab 03/18/21 2040 03/19/21 0500 03/20/21 0509  NA 133* 133* 135  K 2.7* 3.3* 3.6  CL 100 98 104  CO2 24 22 22   GLUCOSE 145* 103* 108*  BUN 16 12 17   CREATININE 1.06* 0.80 0.70  CALCIUM 8.5* 9.0 8.8*   GFR: Estimated Creatinine Clearance: 52.2 mL/min (by C-G formula based on SCr of 0.7 mg/dL). Liver Function Tests: Recent Labs  Lab 03/18/21 2040 03/19/21 0500 03/20/21 0509  AST 25 40 51*  ALT 14 16 23   ALKPHOS 67 73 59  BILITOT 1.3* 1.2 1.0  PROT 5.9* 6.1* 5.4*  ALBUMIN 3.3* 3.3* 2.7*   No results for input(s): LIPASE, AMYLASE in the last 168 hours. No results for input(s): AMMONIA in the last 168 hours. Coagulation Profile: No results for input(s): INR, PROTIME in the last 168 hours. Cardiac Enzymes: No results for input(s): CKTOTAL, CKMB, CKMBINDEX, TROPONINI in the last 168 hours. BNP (last 3 results) No results for input(s): PROBNP in the last 8760 hours. HbA1C: No results for input(s): HGBA1C in the last 72 hours. CBG: No results for input(s): GLUCAP in the last 168 hours. Lipid Profile: No results for input(s): CHOL, HDL, LDLCALC, TRIG, CHOLHDL, LDLDIRECT in the last 72 hours. Thyroid Function Tests: No results for input(s): TSH, T4TOTAL, FREET4, T3FREE, THYROIDAB in the last 72 hours. Anemia Panel: No results for input(s): VITAMINB12, FOLATE, FERRITIN, TIBC, IRON, RETICCTPCT in the last 72 hours. Sepsis Labs: Recent Labs  Lab 03/18/21 2040 03/18/21 2151  LATICACIDVEN 2.6* 2.6*    Recent Results (from the past 240 hour(s))  Resp Panel by RT-PCR (Flu A&B, Covid) Nasopharyngeal Swab     Status: None   Collection Time: 03/18/21   8:44 PM   Specimen: Nasopharyngeal Swab; Nasopharyngeal(NP) swabs in vial transport medium  Result Value Ref Range Status   SARS Coronavirus 2 by RT PCR NEGATIVE NEGATIVE Final    Comment: (NOTE) SARS-CoV-2 target nucleic acids are NOT DETECTED.  The SARS-CoV-2 RNA is generally detectable in upper respiratory specimens during the acute phase of infection. The lowest concentration of SARS-CoV-2 viral copies this assay can detect is 138 copies/mL. A negative result does not preclude SARS-Cov-2 infection and should not be used as the sole basis for treatment or other patient management decisions. A negative result may occur with  improper specimen collection/handling, submission of specimen other than nasopharyngeal swab, presence of viral mutation(s) within the areas targeted by this assay, and inadequate number of viral copies(<138 copies/mL). A negative result must be combined with clinical observations, patient history, and epidemiological information. The expected result is Negative.  Fact Sheet for Patients:  EntrepreneurPulse.com.au  Fact Sheet for Healthcare Providers:  IncredibleEmployment.be  This test is no t yet approved or cleared by the [place] and  has been authorized for detection and/or diagnosis of SARS-CoV-2 by FDA under an Emergency Use Authorization (EUA). This EUA will remain  in effect (meaning this test can be used) for the duration of the COVID-19 declaration under Section 564(b)(1) of the Act, 21 U.S.C.section 360bbb-3(b)(1), unless the authorization is terminated  or revoked sooner.       Influenza A by PCR NEGATIVE NEGATIVE Final   Influenza B by PCR NEGATIVE NEGATIVE Final    Comment: (NOTE) The Xpert Xpress SARS-CoV-2/FLU/RSV plus assay is intended as an aid in the diagnosis of influenza from Nasopharyngeal swab specimens and should not be used as a sole basis for treatment. Nasal washings and aspirates  are unacceptable for Xpert Xpress SARS-CoV-2/FLU/RSV testing.  Fact Sheet for Patients: EntrepreneurPulse.com.au  Fact Sheet for Healthcare Providers: IncredibleEmployment.be  This test is not yet approved or cleared by the [place] FDA and has been authorized for detection and/or diagnosis of SARS-CoV-2 by FDA under an Emergency Use Authorization (EUA). This EUA will remain in effect (meaning this test can be used) for the duration of the COVID-19 declaration under Section 564(b)(1) of the Act, 21 U.S.C. section 360bbb-3(b)(1), unless the authorization is terminated or revoked.  Performed at [facility], [place] [address], [place], [place] [address]          Radiology Studies: CT HEAD WO CONTRAST  Result Date: 03/18/2021 CLINICAL DATA:  Altered mental status EXAM: CT HEAD WITHOUT CONTRAST TECHNIQUE: Contiguous axial images were obtained from the base of the skull through the vertex without intravenous contrast. COMPARISON:  Head CT dated 09/15/2020 FINDINGS: Brain: Generalized parenchymal volume loss with commensurate dilatation of the ventricles and sulci. Ventricles are stable in size and configuration. 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 Chronic small vessel ischemic changes again noted within the bilateral periventricular and subcortical white matter regions. No mass, hemorrhage, edema or other evidence of acute parenchymal abnormality. No extra-axial hemorrhage. Vascular: Chronic calcified atherosclerotic changes of the large vessels at the skull base. No unexpected hyperdense vessel. Skull: Normal. Negative for fracture or focal lesion. Sinuses/Orbits: No acute finding. Other: None. IMPRESSION: 1. No acute findings. No intracranial mass, hemorrhage or edema. 2. 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 3. Chronic small vessel ischemic changes in the white matter. Electronically Signed   By: Franki Cabot M.D.   On: 03/18/2021 22:30   DG Chest Port 1 View  Result Date: 03/18/2021 CLINICAL DATA:  Altered mental status EXAM: PORTABLE CHEST 1 VIEW COMPARISON:  Chest x-rays dated 09/04/2019 and 06/18/2019. FINDINGS: Heart size and mediastinal contours are stable. Lungs are clear. No pleural effusion or pneumothorax is seen. No acute-appearing osseous abnormality. IMPRESSION: No active disease. No evidence of pneumonia or pulmonary edema. Electronically Signed   By: Franki Cabot M.D.   On: 03/18/2021 22:31    Scheduled Meds: . amLODipine  5 mg Oral Daily  . apixaban  5 mg Oral BID  . cetaphil   Topical Daily  . diphenhydrAMINE-zinc acetate  1 application Topical BID  . donepezil  5 mg Oral QHS  . hydrOXYzine  25 mg Oral TID  . levETIRAcetam  500 mg Oral BID  . lisinopril  40 mg Oral Daily  . loratadine  10 mg Oral Daily  . melatonin  10 mg Oral QHS  . metoprolol tartrate  50 mg Oral BID  . pravastatin  40 mg Oral q1800  . traZODone  50 mg Oral QHS   Continuous Infusions: . cefTRIAXone (ROCEPHIN)  IV Stopped (03/19/21 1437)     LOS: 2 days   Time spent: 80min  Lelynd Poer C Ryver Poblete, DO [facility]  If 7PM-7AM, please contact night-coverage [email or web address]  03/20/2021, 7:35 AM

## 2021-03-20 NOTE — Progress Notes (Signed)
Patient has temp of 101.1 BP 122/92 HR 101 RR 20 O2 sat 83% on 2L/[place]. Patient on yellow MEWS. MD, [place] notified via secure chat. Order received. Will continue to monitor.

## 2021-03-20 NOTE — Progress Notes (Addendum)
HOSPITAL MEDICINE OVERNIGHT EVENT NOTE    Notified by nursing the patient is exhibiting a yellow MEWS score.  Patient is exhibiting a fever of 101 F.  This is despite receiving 3 days of intravenous ceftriaxone for suspected sepsis secondary to urinary tract infection.  Orders reviewed, it seems that patient's ceftriaxone regimen has expired.  Due to ongoing fevers and yellow mews score we will extend this regimen to 7 days or at least until final urine and blood culture results/  Also of note, nursing reports the patient is exhibiting an increasing oxygen requirement.  Patient required 2 L of oxygen yesterday but is now is requiring 6 L.  Nursing reports the patient does not exhibit any associated respiratory distress however.  We will obtain stat chest x-ray and ABG in follow-up.  Kendra Emerald  MD [facility]   ADDENDUM  ABG revealing PaO2 of 68 but otherwise unremarkable.  Chest x-ray revealing developing right lower lobe pneumonia.  Patient is already on intravenous ceftriaxone as mentioned above, we will add intravenous azithromycin.  Continue to monitor patient closely.  Kendra Charles

## 2021-03-20 NOTE — Progress Notes (Signed)
   03/19/21 2045  Assess: MEWS Score  Temp (!) 101.1 F (38.4 C)  BP 126/85  Pulse Rate (!) 109  Resp 19  Level of Consciousness Alert  SpO2 92 %  O2 Device Nasal Cannula  Patient Activity (if Appropriate) In bed  O2 Flow Rate (L/min) 2 L/min  Assess: MEWS Score  MEWS Temp 1  MEWS Systolic 0  MEWS Pulse 1  MEWS RR 0  MEWS LOC 0  MEWS Score 2  MEWS Score Color Yellow  Assess: if the MEWS score is Yellow or Red  Were vital signs taken at a resting state? Yes  Focused Assessment Change from prior assessment (see assessment flowsheet)  Early Detection of Sepsis Score *See Row Information* Medium  MEWS guidelines implemented *See Row Information* Yes  Treat  MEWS Interventions Administered prn meds/treatments  Pain Scale 0-10  Pain Score 0  Take Vital Signs  Increase Vital Sign Frequency  Yellow: Q 2hr X 2 then Q 4hr X 2, if remains yellow, continue Q 4hrs  Escalate  MEWS: Escalate Yellow: discuss with charge nurse/RN and consider discussing with provider and RRT  Notify: Charge Nurse/RN  Name of Charge Nurse/RN Notified Percell Boston RN  Date Charge Nurse/RN Notified 03/19/21  Time Charge Nurse/RN Notified 2050  Document  Patient Outcome Stabilized after interventions  Progress note created (see row info) Yes   Fever of 101.1 brought patient into Yellow MEWS.  MEWS protocol implemented.  Tylenol administered and patient's MEWS went to green.  Will continue to monitor patient according to the MEWS Protocol.  Earleen Reaper RN

## 2021-03-21 LAB — GASTROINTESTINAL PANEL BY PCR, STOOL (REPLACES STOOL CULTURE)

## 2021-03-21 LAB — CBC
HCT: 32.4 % — ABNORMAL LOW (ref 36.0–46.0)
Hemoglobin: 10.6 g/dL — ABNORMAL LOW (ref 12.0–15.0)
MCH: 29.7 pg (ref 26.0–34.0)
MCHC: 32.7 g/dL (ref 30.0–36.0)
MCV: 90.8 fL (ref 80.0–100.0)
Platelets: 243 10*3/uL (ref 150–400)
RBC: 3.57 MIL/uL — ABNORMAL LOW (ref 3.87–5.11)
RDW: 13.6 % (ref 11.5–15.5)
WBC: 21.8 10*3/uL — ABNORMAL HIGH (ref 4.0–10.5)
nRBC: 0 % (ref 0.0–0.2)

## 2021-03-21 LAB — COMPREHENSIVE METABOLIC PANEL
ALT: 21 U/L (ref 0–44)
AST: 36 U/L (ref 15–41)
Albumin: 2.4 g/dL — ABNORMAL LOW (ref 3.5–5.0)
Alkaline Phosphatase: 73 U/L (ref 38–126)
Anion gap: 8 (ref 5–15)
BUN: 15 mg/dL (ref 8–23)
CO2: 26 mmol/L (ref 22–32)
Calcium: 8.4 mg/dL — ABNORMAL LOW (ref 8.9–10.3)
Chloride: 100 mmol/L (ref 98–111)
Creatinine, Ser: 0.64 mg/dL (ref 0.44–1.00)
GFR, Estimated: >60 mL/min (ref >60)
Glucose, Bld: 105 mg/dL — ABNORMAL HIGH (ref 70–99)
Potassium: 2.8 mmol/L — ABNORMAL LOW (ref 3.5–5.1)
Sodium: 134 mmol/L — ABNORMAL LOW (ref 135–145)
Total Bilirubin: 0.5 mg/dL (ref 0.3–1.2)
Total Protein: 5.1 g/dL — ABNORMAL LOW (ref 6.5–8.1)

## 2021-03-21 MED ORDER — POTASSIUM CHLORIDE CRYS ER 20 MEQ PO TBCR
40.0000 meq | EXTENDED_RELEASE_TABLET | Freq: Three times a day (TID) | ORAL | Status: AC
  Administered 2021-03-21 (×2): 40 meq via ORAL
  Filled 2021-03-21 (×2): qty 2

## 2021-03-21 MED ORDER — SODIUM CHLORIDE 0.9 % IV SOLN
500.0000 mg | Freq: Every day | INTRAVENOUS | Status: DC
  Administered 2021-03-21: 500 mg via INTRAVENOUS
  Filled 2021-03-21 (×4): qty 500

## 2021-03-21 NOTE — Plan of Care (Signed)
  Problem: Clinical Measurements: Goal: Will remain free from infection Outcome: Progressing   Problem: Nutrition: Goal: Adequate nutrition will be maintained Outcome: Progressing   Problem: Elimination: Goal: Will not experience complications related to bowel motility Outcome: Progressing

## 2021-03-21 NOTE — Progress Notes (Signed)
PROGRESS NOTE   Iviana Blasingame  DDU:[id number] DOB: 1940-10-16 DOA: 03/18/2021 PCP: Patient, No Pcp Per (Inactive)   Brief Narrative:   Elnor Renovato is a 80 y.o. female with medical history significant of dementia, essential hypertension, hyperlipidemia, seizure disorder, previous COVID-19 infection and hyponatremia among other things who was brought in from skilled nursing facility today secondary to acute mental status changes.  No seizure-like activity was noted, some complaints of increased urinary frequency and dysuria at intake concerning for UTI.  Assessment & Plan:   Principal Problem:   Acute metabolic encephalopathy Active Problems:   AKI (acute kidney injury) ([place])   Dementia ([place])   Essential hypertension   Hyperlipidemia   Seizure (H[facility])   Hypokalemia   Leucocytosis   A-fib (HCC)   Severe sepsis, multifactorial, POA, ongoing Pseudomonas UTI, POA  EAEC diarrhea  Concurrent acute metabolic encephalopathy resolved(back to baseline):  -Questionable aspiration event overnight given acute hypoxia (see below) - azithyromycin/ceftriaxone initiated overnight -UTI treatment completed (3 days ceftriaxone) - cultures now showing 40k Pseudomonas - if symptoms recur consider quinolone -Watery diarrhea improving - C-diff and toxin negative -  - stool panel positive for Enteroaggregative E Coli (Typically self limiting food borne illness - will not treat given minimal symptoms - if patient has worsening symptoms consider ID discussion/evaluation).  Acute hypoxic respiratory failure Rule out aspiration pneumonitis/pneumonia - Continue oxygen supplementation - wean as tolerated to room air (baseline) - Abx as above - Speech eval to ensure no silent aspiration  Hypokalemia secondary to diarrheal illness, above -Potassium supplementation daily as needed; 2.8 today - 120MEQ given throughout the day  Seizure disorder: - Continue Keppra  AKI, resolving:  - Likely prerenal in the  setting of poor p.o. intake, increased urine output as above - Continue IV fluids  Essential hypertension: - Continue amlodipine, lisinopril and metoprolol  - Hydrochlorothiazide held in the setting of hypokalemia  Hypokalemia, resolving:  - Potassium 2.7 at admission, 3.6 today, continue to increase p.o. intake as tolerated  Dementia:  Continue home Aricept Baseline is awake alert oriented to person and general situation, conversive, can have meaningful conversation but is not aware of current events   A-fibrillation:  Continue metoprolol and Eliquis.  Hyperlipidemia:  Not on statin questionably secondary to intolerance.  Continue to monitor  DVT prophylaxis: Eliquis Code Status: Full code Family Communication: Son Rob previously updated  Status is: Inpatient  Dispo: The patient is from: Facility              Anticipated d/c is to: Same              Anticipated d/c date is: 24 to 48 hours              Patient currently not medically stable for discharge  Consultants:   None  Procedures:   None  Antimicrobials:  Ceftriaxone x3 days  Subjective: Acute hypoxic event overnight - likely aspiration;fever and ongoing diarrhea also noted. Denies chest pain, headache, chills, urinary symptoms or overt dyspnea. Denies history of swallowing problems.  Objective: Vitals:   03/20/21 2142 03/20/21 2146 03/21/21 0001 03/21/21 0413  BP: 131/88  98/69 106/78  Pulse: (!) 101 89 80 93  Resp: 20  20 19   Temp: 99 F (37.2 C)  99.5 F (37.5 C) 98.5 F (36.9 C)  TempSrc: Oral  Oral Oral  SpO2: 97% 96% 95% 92%  Weight:      Height:        Intake/Output Summary (Last 24  hours) at 03/21/2021 0746 Last data filed at 03/21/2021 0700 Gross per 24 hour  Intake 830 ml  Output 0 ml  Net 830 ml   Filed Weights   03/19/21 1618  Weight: 58.7 kg    Examination:  General exam: Appears calm and comfortable; wearing nasal cannula without distress. Respiratory system: bibasilar  rhonchi R>L; without wheeze Cardiovascular system: S1 & S2 heard, RRR. No JVD, murmurs, rubs, gallops or clicks. No pedal edema. Gastrointestinal system: Abdomen is nondistended, soft and nontender. No organomegaly or masses felt. Normal bowel sounds heard. Central nervous system: Alert and oriented to person and situation this morning. No focal neurological deficits. Extremities: Symmetric 5 x 5 power. Skin: Multiple excoriations over her upper extremities chest and abdomen but no overt rash noted  Data Reviewed: I have personally reviewed following labs and imaging studies  CBC: Recent Labs  Lab 03/18/21 2040 03/18/21 2117 03/19/21 0500 03/20/21 0509 03/21/21 0300  WBC 26.9*  --  27.8* 23.9* 21.8*  NEUTROABS 22.9*  --   --   --   --   HGB 12.6 11.2* 12.9 12.1 10.6*  HCT 36.8 33.0* 38.9 35.6* 32.4*  MCV 88.0  --  91.1 89.2 90.8  PLT 265  --  230 208 240   Basic Metabolic Panel: Recent Labs  Lab 03/18/21 2040 03/18/21 2117 03/19/21 0500 03/20/21 0509 03/21/21 0300  NA 133* 138 133* 135 134*  K 2.7* 2.8* 3.3* 3.6 2.8*  CL 100  --  98 104 100  CO2 24  --  22 22 26   GLUCOSE 145*  --  103* 108* 105*  BUN 16  --  12 17 15   CREATININE 1.06*  --  0.80 0.70 0.64  CALCIUM 8.5*  --  9.0 8.8* 8.4*   GFR: Estimated Creatinine Clearance: 52.2 mL/min (by C-G formula based on SCr of 0.64 mg/dL). Liver Function Tests: Recent Labs  Lab 03/18/21 2040 03/19/21 0500 03/20/21 0509 03/21/21 0300  AST 25 40 51* 36  ALT 14 16 23 21   ALKPHOS 67 73 59 73  BILITOT 1.3* 1.2 1.0 0.5  PROT 5.9* 6.1* 5.4* 5.1*  ALBUMIN 3.3* 3.3* 2.7* 2.4*   No results for input(s): LIPASE, AMYLASE in the last 168 hours. No results for input(s): AMMONIA in the last 168 hours. Coagulation Profile: No results for input(s): INR, PROTIME in the last 168 hours. Cardiac Enzymes: No results for input(s): CKTOTAL, CKMB, CKMBINDEX, TROPONINI in the last 168 hours. BNP (last 3 results) No results for input(s):  PROBNP in the last 8760 hours. HbA1C: No results for input(s): HGBA1C in the last 72 hours. CBG: No results for input(s): GLUCAP in the last 168 hours. Lipid Profile: No results for input(s): CHOL, HDL, LDLCALC, TRIG, CHOLHDL, LDLDIRECT in the last 72 hours. Thyroid Function Tests: No results for input(s): TSH, T4TOTAL, FREET4, T3FREE, THYROIDAB in the last 72 hours. Anemia Panel: No results for input(s): VITAMINB12, FOLATE, FERRITIN, TIBC, IRON, RETICCTPCT in the last 72 hours. Sepsis Labs: Recent Labs  Lab 03/18/21 2040 03/18/21 2151  LATICACIDVEN 2.6* 2.6*    Recent Results (from the past 240 hour(s))  Resp Panel by RT-PCR (Flu A&B, Covid) Nasopharyngeal Swab     Status: None   Collection Time: 03/18/21  8:44 PM   Specimen: Nasopharyngeal Swab; Nasopharyngeal(NP) swabs in vial transport medium  Result Value Ref Range Status   SARS Coronavirus 2 by RT PCR NEGATIVE NEGATIVE Final    Comment: (NOTE) SARS-CoV-2 target nucleic acids are NOT DETECTED.  The SARS-CoV-2 RNA is generally detectable in upper respiratory specimens during the acute phase of infection. The lowest concentration of SARS-CoV-2 viral copies this assay can detect is 138 copies/mL. A negative result does not preclude SARS-Cov-2 infection and should not be used as the sole basis for treatment or other patient management decisions. A negative result may occur with  improper specimen collection/handling, submission of specimen other than nasopharyngeal swab, presence of viral mutation(s) within the areas targeted by this assay, and inadequate number of viral copies(<138 copies/mL). A negative result must be combined with clinical observations, patient history, and epidemiological information. The expected result is Negative.  Fact Sheet for Patients:  EntrepreneurPulse.com.au  Fact Sheet for Healthcare Providers:  IncredibleEmployment.be  This test is no t yet approved or  cleared by the [place] FDA and  has been authorized for detection and/or diagnosis of SARS-CoV-2 by FDA under an Emergency Use Authorization (EUA). This EUA will remain  in effect (meaning this test can be used) for the duration of the COVID-19 declaration under Section 564(b)(1) of the Act, 21 U.S.C.section 360bbb-3(b)(1), unless the authorization is terminated  or revoked sooner.       Influenza A by PCR NEGATIVE NEGATIVE Final   Influenza B by PCR NEGATIVE NEGATIVE Final    Comment: (NOTE) The Xpert Xpress SARS-CoV-2/FLU/RSV plus assay is intended as an aid in the diagnosis of influenza from Nasopharyngeal swab specimens and should not be used as a sole basis for treatment. Nasal washings and aspirates are unacceptable for Xpert Xpress SARS-CoV-2/FLU/RSV testing.  Fact Sheet for Patients: EntrepreneurPulse.com.au  Fact Sheet for Healthcare Providers: IncredibleEmployment.be  This test is not yet approved or cleared by the [place] FDA and has been authorized for detection and/or diagnosis of SARS-CoV-2 by FDA under an Emergency Use Authorization (EUA). This EUA will remain in effect (meaning this test can be used) for the duration of the COVID-19 declaration under Section 564(b)(1) of the Act, 21 U.S.C. section 360bbb-3(b)(1), unless the authorization is terminated or revoked.  Performed at [facility], [place] [address] [place], [place] [address]   Blood culture (routine x 2)     Status: None (Preliminary result)   Collection Time: 03/18/21 10:30 PM   Specimen: BLOOD  Result Value Ref Range Status   Specimen Description BLOOD RIGHT ARM  Final   Special Requests   Final    BOTTLES DRAWN AEROBIC AND ANAEROBIC Blood Culture adequate volume   Culture   Final    NO GROWTH 1 DAY Performed at [facility], [place] [address], [place], [place] [address]    Report Status PENDING  Incomplete  Blood culture (routine x 2)      Status: None (Preliminary result)   Collection Time: 03/18/21 10:35 PM   Specimen: BLOOD  Result Value Ref Range Status   Specimen Description BLOOD LEFT ARM  Final   Special Requests   Final    BOTTLES DRAWN AEROBIC ONLY Blood Culture results may not be optimal due to an inadequate volume of blood received in culture bottles   Culture   Final    NO GROWTH 1 DAY Performed at [facility], [place] [address] [place],  [address]    Report Status PENDING  Incomplete  C Difficile Quick Screen w PCR reflex     Status: None   Collection Time: 03/20/21  2:14 PM   Specimen: STOOL  Result Value Ref Range Status   C Diff antigen NEGATIVE NEGATIVE  Final   C Diff toxin NEGATIVE NEGATIVE Final   C Diff interpretation No C. difficile detected.  Final    Comment: Performed at [facility], [place] [address], [place], [place] [address]         Radiology Studies: DG Chest 1 View  Result Date: 03/20/2021 CLINICAL DATA:  Respiratory failure. EXAM: CHEST  1 VIEW COMPARISON:  Radiograph 03/18/2021. FINDINGS: Mild cardiomegaly. Aortic atherosclerosis and tortuosity. Developing patchy opacity at the right lung base and right mid lung. No pneumothorax. Possible small right pleural effusion. Remote left clavicle fracture. IMPRESSION: Developing patchy opacity at the right lung base and right mid lung, suspicious for pneumonia. Possible small right pleural effusion. Recommend correlation for aspiration risk factors. Electronically Signed   By: Keith Rake M.D.   On: 03/20/2021 22:08    Scheduled Meds: . amLODipine  5 mg Oral Daily  . apixaban  5 mg Oral BID  . cetaphil   Topical Daily  . diphenhydrAMINE-zinc acetate  1 application Topical BID  . donepezil  5 mg Oral QHS  . hydrOXYzine  25 mg Oral TID  . levETIRAcetam  500 mg Oral BID  . lisinopril  40 mg Oral Daily  . loratadine  10 mg Oral Daily  . melatonin  10 mg Oral QHS  . metoprolol tartrate  50 mg Oral BID  . pravastatin   40 mg Oral q1800  . traZODone  50 mg Oral QHS   Continuous Infusions: . azithromycin Stopped (03/21/21 0509)  . cefTRIAXone (ROCEPHIN)  IV       LOS: 3 days   Time spent: 15min  Ladonna Vanorder C Chace Bisch, DO [facility]  If 7PM-7AM, please contact night-coverage [email or web address]  03/21/2021, 7:46 AM

## 2021-03-21 NOTE — Evaluation (Signed)
Physical Therapy Evaluation Patient Details Name: Kendra Charles MRN: [id number] DOB: 01/01/1941 Today's Date: 03/21/2021   History of Present Illness  80 y.o. female  brought in from skilled nursing facility 03/18/21 secondary to worsening mental status.Pt admitted for acute metabolic encephalopathy. PMH: dementia, essential hypertension, hyperlipidemia, seizure disorder, previous COVID-19 infection and hyponatremia.  Clinical Impression  PT in hallway when bed alarm sounded, found pt wedged between bedrail and footboard trying to get up. Pt requires maximal cuing for remaining in bed until PT can get there. Pt modA for pivoting to [facility]. Pt sat for about 6 minutes and did not have additional BM or urination. Pt pivoted back to bed and getting back into bed, with maxA and maximal multimodal cuing. Pt reports living with her sister and does not answer any questions regarding he mobility prior to coming to hospital. Bed alarm was set and PT left room and was documenting when bed alarm went off again and pt was again at the foot of the bed wedged between the bedrail and footboard. This time she had urinated in bed and was trying to get up again. Again got up to [facility] with maxA and back to bed. PT recommending return to SNF at discharge. PT will continue to follow acutely.     Follow Up Recommendations SNF    Equipment Recommendations  None recommended by PT    Recommendations for Other Services OT consult     Precautions / Restrictions Precautions Precautions: Fall Precaution Comments: hx of falls Restrictions Weight Bearing Restrictions: No Other Position/Activity Restrictions: enteric precautions E. coli      Mobility  Bed Mobility Overal bed mobility: Needs Assistance Bed Mobility: Supine to Sit;Sit to Supine     Supine to sit: Min guard;HOB elevated Sit to supine: Min assist;Mod assist   General bed mobility comments: x2 (initially had BM in bed, second time had urinated in bed) pt  sitting EoB with LE between bedrail and foot of bed trying to get up with bed alarm sounding, min A for getting LE into bed however pt then flopped back on bed requiring modA for squaring to bed, decreased awareness of how to get herself squared to bed    Transfers Overall transfer level: Needs assistance Equipment used: 1 person hand held assist Transfers: Sit to/from [facility] Sit to Stand: Mod assist Stand pivot transfers: Max assist       General transfer comment: requires mod A for sit<>stand from bed and BSC, maxA for pivoting to and from bed and BSC         Balance Overall balance assessment: Needs assistance Sitting-balance support: Feet supported;No upper extremity supported Sitting balance-Leahy Scale: Fair     Standing balance support: Bilateral upper extremity supported Standing balance-Leahy Scale: Zero                               Pertinent Vitals/Pain Pain Assessment: Faces Faces Pain Scale: Hurts a little bit Pain Location: generalized, bilateral knee with standing Pain Descriptors / Indicators: Grimacing;Guarding;Discomfort Pain Intervention(s): Limited activity within patient's tolerance;Monitored during session;Repositioned    Home Living Family/patient expects to be discharged to:: Skilled nursing facility                 Additional Comments: pt still thinks she is living with her sister, unable to provide PLOF or home set up    Prior Function Level of Independence: Needs assistance  Hand Dominance   Dominant Hand: Right    Extremity/Trunk Assessment   Upper Extremity Assessment Upper Extremity Assessment: Generalized weakness    Lower Extremity Assessment Lower Extremity Assessment: RLE deficits/detail;LLE deficits/detail;Difficult to assess due to impaired cognition RLE Deficits / Details: knee ROM appears limited with attempt to stand, does not follow commands for movement LLE  Deficits / Details: knee ROM appears limited with attempt to stand, does not follow commands for movement       Communication   Communication: HOH  Cognition Arousal/Alertness: Awake/alert Behavior During Therapy: Impulsive;Restless;Agitated Overall Cognitive Status: History of cognitive impairments - at baseline                                 General Comments: unable to provide PLOF      General Comments General comments (skin integrity, edema, etc.): Pt on 5L O2 via [place], very poor safety awareness trying to exit bed between bed rail and footboard        Assessment/Plan    PT Assessment Patient needs continued PT services  PT Problem List Decreased strength;Decreased range of motion;Decreased activity tolerance;Decreased balance;Decreased mobility;Decreased coordination;Decreased cognition;Decreased safety awareness;Pain       PT Treatment Interventions Gait training;Functional mobility training;Therapeutic activities;Therapeutic exercise;Balance training;Cognitive remediation;Patient/family education    PT Goals (Current goals can be found in the Care Plan section)  Acute Rehab PT Goals Patient Stated Goal: see sister PT Goal Formulation: With patient Time For Goal Achievement: 04/04/21 Potential to Achieve Goals: Poor    Frequency Min 2X/week    AM-PAC PT "6 Clicks" Mobility  Outcome Measure Help needed turning from your back to your side while in a flat bed without using bedrails?: None Help needed moving from lying on your back to sitting on the side of a flat bed without using bedrails?: A Little Help needed moving to and from a bed to a chair (including a wheelchair)?: A Lot Help needed standing up from a chair using your arms (e.g., wheelchair or bedside chair)?: A Lot Help needed to walk in hospital room?: Total Help needed climbing 3-5 steps with a railing? : Total 6 Click Score: 13    End of Session Equipment Utilized During Treatment: Gait  belt Activity Tolerance: Patient tolerated treatment well Patient left: in bed;with call bell/phone within reach;with bed alarm set Nurse Communication: Mobility status;Other (comment) (tendancy for exiting bed with bowel and bladder needs) PT Visit Diagnosis: Unsteadiness on feet (R26.81);Other abnormalities of gait and mobility (R26.89);Muscle weakness (generalized) (M62.81);History of falling (Z91.81);Difficulty in walking, not elsewhere classified (R26.2);Pain Pain - part of body: Knee    Time: 5397-6734 PT Time Calculation (min) (ACUTE ONLY): 33 min   Charges:   PT Evaluation $PT Eval Moderate Complexity: 1 Mod PT Treatments $Therapeutic Activity: 8-22 mins        Clothilde Tippetts B. Migdalia Dk PT, DPT Acute Rehabilitation Services Pager [phone] Office [phone]   [place] 03/21/2021, 1:54 PM

## 2021-03-22 LAB — COMPREHENSIVE METABOLIC PANEL
ALT: 28 U/L (ref 0–44)
AST: 44 U/L — ABNORMAL HIGH (ref 15–41)
Albumin: 2.5 g/dL — ABNORMAL LOW (ref 3.5–5.0)
Alkaline Phosphatase: 65 U/L (ref 38–126)
Anion gap: 8 (ref 5–15)
BUN: 9 mg/dL (ref 8–23)
CO2: 28 mmol/L (ref 22–32)
Calcium: 8.7 mg/dL — ABNORMAL LOW (ref 8.9–10.3)
Chloride: 100 mmol/L (ref 98–111)
Creatinine, Ser: 0.65 mg/dL (ref 0.44–1.00)
GFR, Estimated: >60 mL/min (ref >60)
Glucose, Bld: 93 mg/dL (ref 70–99)
Potassium: 3.5 mmol/L (ref 3.5–5.1)
Sodium: 136 mmol/L (ref 135–145)
Total Bilirubin: 0.7 mg/dL (ref 0.3–1.2)
Total Protein: 5.4 g/dL — ABNORMAL LOW (ref 6.5–8.1)

## 2021-03-22 LAB — CBC
HCT: 32.7 % — ABNORMAL LOW (ref 36.0–46.0)
Hemoglobin: 10.9 g/dL — ABNORMAL LOW (ref 12.0–15.0)
MCH: 30 pg (ref 26.0–34.0)
MCHC: 33.3 g/dL (ref 30.0–36.0)
MCV: 90.1 fL (ref 80.0–100.0)
Platelets: 257 10*3/uL (ref 150–400)
RBC: 3.63 MIL/uL — ABNORMAL LOW (ref 3.87–5.11)
RDW: 13.6 % (ref 11.5–15.5)
WBC: 17.8 10*3/uL — ABNORMAL HIGH (ref 4.0–10.5)
nRBC: 0 % (ref 0.0–0.2)

## 2021-03-22 LAB — SARS CORONAVIRUS 2 (TAT 6-24 HRS): SARS Coronavirus 2: NEGATIVE

## 2021-03-22 LAB — URINE CULTURE: Culture: 40000 — AB

## 2021-03-22 MED ORDER — AZITHROMYCIN 500 MG PO TABS
500.0000 mg | ORAL_TABLET | Freq: Every day | ORAL | Status: DC
  Administered 2021-03-22: 500 mg via ORAL
  Filled 2021-03-22: qty 1

## 2021-03-22 NOTE — TOC Initial Note (Signed)
Transition of Care [facility]) - Initial/Assessment Note    Patient Details  Name: Kendra Charles MRN: [id number] Date of Birth: [birth year]  Transition [facility]) CM/SW Contact:    Sable Feil, LCSW Phone Number: 03/22/2021, 1:05 PM  Clinical Narrative: Son Rochel Brome contacted by phone ([phone]) and talked with him regarding recommendation of ST rehab. Son expressed agreement with ST rehab and reported that his mom is more and more not getting out of bed. He added that at times she won't get out of bed for her meals. Mr. Alvira Monday added that he has been thinking about transitioning his mom to a skilled nursing facility for LTC care and he has noted the change in her activity status and feels his mother is no longer interested in doing anything. He explained that his mom receives help with bathing and dressing and at times she will get up and get dressed and then lay back down. Mr. Alvira Monday expressed interest in [facility] and reported that she has talked with someone there before.                 Expected Discharge Plan: Skilled Nursing Facility Barriers to Discharge: SNF Pending bed offer   Patient Goals and CMS Choice Patient states their goals for this hospitalization and ongoing recovery are:: Son agreeable to [place] rehab before returnring to [facility].gov Compare Post Acute Care list provided to:: Patient Represenative (must comment) (Talked with son regarding Medicare.gov and son reported that he has a list) Choice offered to / list presented to : Adult Children (Son Rochel Brome)  Expected Discharge Plan and Services Expected Discharge Plan: [place] In-house Referral: Clinical Social Work   Post Acute Care Choice: [place] Living arrangements for the past 2 months: [facility])                                     Prior Living Arrangements/Services Living  arrangements for the past 2 months: [place] ([facility]) Lives with:: Facility Resident ([facility]) Patient language and need for interpreter reviewed:: No Do you feel safe going back to the place where you live?: No   Son agreeable to his mother receiveing ST rehab services before returning to ALF  Need for Family Participation in Patient Care: Yes (Comment) Care giver support system in place?: Yes (comment) (at ALF)   Criminal Activity/Legal Involvement Pertinent to Current Situation/Hospitalization: No - Comment as needed  Activities of Daily Living Home Assistive Devices/Equipment:  (from assisted living) ADL Screening (condition at time of admission) Is the patient deaf or have difficulty hearing?: Yes Does the patient have difficulty seeing, even when wearing glasses/contacts?: No Does the patient have difficulty concentrating, remembering, or making decisions?: Yes Patient able to express need for assistance with ADLs?: No Does the patient have difficulty dressing or bathing?: Yes Independently performs ADLs?: No Communication: Independent Dressing (OT): Needs assistance Is this a change from baseline?: Pre-admission baseline Grooming: Needs assistance Is this a change from baseline?: Pre-admission baseline Feeding: Needs assistance Is this a change from baseline?: Pre-admission baseline Bathing: Needs assistance Is this a change from baseline?: Pre-admission baseline Toileting: Needs assistance Is this a change from baseline?: Pre-admission baseline In/Out Bed: Needs assistance Is this a change from baseline?: Pre-admission baseline Walks in Home: Dependent Is this a change from baseline?: Pre-admission baseline Does the patient  have difficulty walking or climbing stairs?: Yes Weakness of Legs: Both Weakness of Arms/Hands: Both  Permission Sought/Granted Permission sought to share information with : Other (comment) (Patient oriented to self  only, contacted son) Permission granted to share information with : No (Patient oriented to self only, contacted son)              Emotional Assessment Appearance:: Appears stated age Attitude/Demeanor/Rapport: Unable to Assess Affect (typically observed): Unable to Assess Orientation: : Oriented to Self Alcohol / Substance Use: Tobacco Use,Alcohol Use,Illicit Drugs (Per H&P, Patient quit smoking and does not consume alcohol or use illicit drugs) Psych Involvement: No (comment)  Admission diagnosis:  Hypokalemia [E87.6] Unresponsive episode [R41.89] Altered mental status, unspecified altered mental status type [S92.33] Acute metabolic encephalopathy [A07.62] Sepsis without acute organ dysfunction, due to unspecified organism [facility]) [A41.9] Patient Active Problem List   Diagnosis Date Noted  . A-fib ([place]) 03/19/2021  . Hypokalemia 03/18/2021  . Leucocytosis 03/18/2021  . Acute metabolic encephalopathy 26/33/3545  . Acute hypoxemic respiratory failure due to COVID-19 ([place]) 09/04/2019  . Seizure ([place]) 06/19/2019  . Hyponatremia 06/18/2019  . AKI (acute kidney injury) ([place]) 06/18/2019  . Dementia ([place]) 06/18/2019  . Essential hypertension 06/18/2019  . Hyperlipidemia 06/18/2019   PCP:  Patient, No Pcp Per (Inactive) Pharmacy:  No Pharmacies Listed    Social Determinants of Health (SDOH) Interventions  No SDOH interventions requested or needed at this time.   Readmission Risk Interventions Readmission Risk Prevention Plan 06/19/2019  Transportation Screening Complete  PCP or Specialist Appt within 5-7 Days Complete  Home Care Screening Complete  Medication Review (RN CM) Complete

## 2021-03-22 NOTE — Plan of Care (Signed)
  Problem: Clinical Measurements: Goal: Will remain free from infection Outcome: Progressing Goal: Respiratory complications will improve Outcome: Progressing   Problem: Activity: Goal: Risk for activity intolerance will decrease Outcome: Progressing   

## 2021-03-22 NOTE — Evaluation (Signed)
Clinical/Bedside Swallow Evaluation Patient Details  Name: Kendra Charles MRN: [id number] Date of Birth: [birth year]  Today's Date: 03/22/2021 Time: SLP Start Time (ACUTE ONLY): 0359 SLP Stop Time (ACUTE ONLY): 0408 SLP Time Calculation (min) (ACUTE ONLY): 9 min  Past Medical History:  Past Medical History:  Diagnosis Date  . Hypercholesteremia   . Hypertension    Past Surgical History: No past surgical history on file. HPI:  80 y.o. female  brought in from skilled nursing facility 03/18/21 secondary to worsening mental status.Pt admitted for acute metabolic encephalopathy. PMH: dementia, essential hypertension, hyperlipidemia, seizure disorder, previous COVID-19 infection and hyponatremia   Assessment / Plan / Recommendation Clinical Impression  Pt presents with a mild oral dysphagia this date. Pt with upper dentures only, exhibiting prolonged mastication of solids with oral residuals. No overt s/sx of aspiration with any PO. Recommend dysphagia 3 (mechanical soft) and thin liquids with meds as tolerated. SLP to follow up for diet tolerance.  SLP Visit Diagnosis: Dysphagia, oral phase (R13.11);Dysphagia, unspecified (R13.10)    Aspiration Risk  Mild aspiration risk    Diet Recommendation   Dysphagia 3 (mechanical soft) thin liquids   Medication Administration: Whole meds with liquid    Other  Recommendations Oral Care Recommendations: Oral care BID   Follow up Recommendations Skilled Nursing facility      Frequency and Duration min 1 x/week  1 week       Prognosis Prognosis for Safe Diet Advancement: Good Barriers to Reach Goals: Cognitive deficits      Swallow Study   General Date of Onset: 03/18/21 HPI: 80 y.o. female  brought in from skilled nursing facility 03/18/21 secondary to worsening mental status.Pt admitted for acute metabolic encephalopathy. PMH: dementia, essential hypertension, hyperlipidemia, seizure disorder, previous COVID-19 infection and hyponatremia Type  of Study: Bedside Swallow Evaluation Previous Swallow Assessment: BSE 2020 grossly WFL Diet Prior to this Study: Regular;Thin liquids Temperature Spikes Noted: No Respiratory Status: Room air History of Recent Intubation: No Behavior/Cognition: Alert;Cooperative;Requires cueing Oral Cavity Assessment: Within Functional Limits Oral Care Completed by SLP: No Oral Cavity - Dentition: Dentures, top;Missing dentition (appeared edentulous lower) Vision: Functional for self-feeding Self-Feeding Abilities: Needs assist Patient Positioning: Upright in bed Baseline Vocal Quality: Normal Volitional Cough: Cognitively unable to elicit Volitional Swallow: Able to elicit    Oral/Motor/Sensory Function Overall Oral Motor/Sensory Function: Generalized oral weakness   Ice Chips Ice chips: Not tested   Thin Liquid Thin Liquid: Within functional limits Presentation: Cup;Straw    Nectar Thick Nectar Thick Liquid: Not tested   Honey Thick Honey Thick Liquid: Not tested   Puree Puree: Within functional limits   Solid     Solid: Impaired Presentation: Self Fed Oral Phase Impairments: Reduced lingual movement/coordination;Impaired mastication Oral Phase Functional Implications: Prolonged oral transit;Oral residue;Impaired mastication Pharyngeal Phase Impairments: Suspected delayed Swallow;Multiple swallows      Hayden Rasmussen MA, CCC-SLP Acute Rehabilitation Services   03/22/2021,4:14 PM

## 2021-03-22 NOTE — Progress Notes (Signed)
PROGRESS NOTE   Kendra Charles  CHY:[id number] DOB: Feb 13, 1941 DOA: 03/18/2021 PCP: Patient, No Pcp Per (Inactive)   Brief Narrative:   Kendra Charles is a 80 y.o. female with medical history significant of dementia, essential hypertension, hyperlipidemia, seizure disorder, previous COVID-19 infection and hyponatremia among other things who was brought in from skilled nursing facility today secondary to acute mental status changes.  No seizure-like activity was noted, some complaints of increased urinary frequency and dysuria at intake concerning for UTI.  Assessment & Plan:   Principal Problem:   Acute metabolic encephalopathy Active Problems:   AKI (acute kidney injury) ([place])   Dementia ([place])   Essential hypertension   Hyperlipidemia   Seizure (H[facility])   Hypokalemia   Leucocytosis   A-fib (H[facility])   Severe sepsis, multifactorial, POA, ongoing Pseudomonas UTI, POA  EAEC diarrhea  Concurrent acute metabolic encephalopathy resolved(back to baseline):  -Questionable aspiration event overnight on 6th given acute hypoxia (see below) - azithyromycin/ceftriaxone ongoing -UTI treatment completed (3 days ceftriaxone) - cultures now showing 40k Pseudomonas - sensitive to cephalosporins -Watery diarrhea improving - C-diff and toxin negative -  - stool panel positive for Enteroaggregative E Coli (Typically self limiting food borne illness - will not treat given minimal symptoms - if patient has worsening symptoms consider ID discussion/evaluation for treatment).  Acute hypoxic respiratory failure Rule out aspiration pneumonitis/pneumonia - Continue oxygen supplementation - wean as tolerated to room air (baseline) - Abx as above with 5 days of azithromycin/ceftriaxone - Speech eval to ensure no silent aspiration  Hypokalemia secondary to diarrheal illness, above -Potassium supplementation daily as needed; 3.5 today  Seizure disorder: - Continue Keppra  AKI, resolving:  - Likely prerenal in  the setting of poor p.o. intake, increased urine output as above - Continue IV fluids  Essential hypertension: - Continue amlodipine, lisinopril and metoprolol  - Hydrochlorothiazide held in the setting of hypokalemia  Dementia:  - Continue home Aricept - Baseline is awake alert oriented to person and general situation, conversive, can have meaningful conversation but is not aware of current events   A-fibrillation:  - Continue metoprolol and Eliquis.  Hyperlipidemia:  - Not on statin questionably secondary to intolerance.  Continue to monitor  DVT prophylaxis: Eliquis Code Status: Full code Family Communication: Son Rob previously updated  Status is: Inpatient  Dispo: The patient is from: Facility              Anticipated d/c is to: SNF              Anticipated d/c date is: 24 hours              Patient currently not medically stable for discharge  Consultants:   None  Procedures:   None  Antimicrobials:  Ceftriaxone x3 days  Subjective: No acute issues or events overnight diarrhea improving, denies any further episodes of shortness of breath chest pain she otherwise denies nausea vomiting constipation headache fevers or chills.  She continues to request discharge back to her previous assisted living facility however given PT recommendations we arelikely going to recommend disposition to skilled nursing facility for ongoing therapy.  Objective: Vitals:   03/21/21 1741 03/21/21 2235 03/22/21 0628 03/22/21 0630  BP: (!) 152/105 127/87 116/81 (!) 107/91  Pulse: 100 95 (!) 117 (!) 102  Resp: 16 18 14    Temp:  98.7 F (37.1 C) 98.3 F (36.8 C)   TempSrc:  Oral    SpO2: 95% 97% 92% 93%  Weight:  Height:        Intake/Output Summary (Last 24 hours) at 03/22/2021 0742 Last data filed at 03/21/2021 1900 Gross per 24 hour  Intake 720 ml  Output 100 ml  Net 620 ml   Filed Weights   03/19/21 1618  Weight: 58.7 kg    Examination:  General exam: Appears  calm and comfortable; wearing nasal cannula without distress. Respiratory system: bibasilar rhonchi R>L; without wheeze Cardiovascular system: S1 & S2 heard, RRR. No JVD, murmurs, rubs, gallops or clicks. No pedal edema. Gastrointestinal system: Abdomen is nondistended, soft and nontender. No organomegaly or masses felt. Normal bowel sounds heard. Central nervous system: Alert and oriented to person and situation this morning. No focal neurological deficits. Extremities: Symmetric 5 x 5 power. Skin: Multiple excoriations over her upper extremities chest and abdomen but no overt rash noted  Data Reviewed: I have personally reviewed following labs and imaging studies  CBC: Recent Labs  Lab 03/18/21 2040 03/18/21 2117 03/19/21 0500 03/20/21 0509 03/21/21 0300 03/22/21 0433  WBC 26.9*  --  27.8* 23.9* 21.8* 17.8*  NEUTROABS 22.9*  --   --   --   --   --   HGB 12.6 11.2* 12.9 12.1 10.6* 10.9*  HCT 36.8 33.0* 38.9 35.6* 32.4* 32.7*  MCV 88.0  --  91.1 89.2 90.8 90.1  PLT 265  --  230 208 243 355   Basic Metabolic Panel: Recent Labs  Lab 03/18/21 2040 03/18/21 2117 03/19/21 0500 03/20/21 0509 03/21/21 0300 03/22/21 0433  NA 133* 138 133* 135 134* 136  K 2.7* 2.8* 3.3* 3.6 2.8* 3.5  CL 100  --  98 104 100 100  CO2 24  --  22 22 26 28   GLUCOSE 145*  --  103* 108* 105* 93  BUN 16  --  12 17 15 9   CREATININE 1.06*  --  0.80 0.70 0.64 0.65  CALCIUM 8.5*  --  9.0 8.8* 8.4* 8.7*   GFR: Estimated Creatinine Clearance: 52.2 mL/min (by C-G formula based on SCr of 0.65 mg/dL). Liver Function Tests: Recent Labs  Lab 03/18/21 2040 03/19/21 0500 03/20/21 0509 03/21/21 0300 03/22/21 0433  AST 25 40 51* 36 44*  ALT 14 16 23 21 28   ALKPHOS 67 73 59 73 65  BILITOT 1.3* 1.2 1.0 0.5 0.7  PROT 5.9* 6.1* 5.4* 5.1* 5.4*  ALBUMIN 3.3* 3.3* 2.7* 2.4* 2.5*   No results for input(s): LIPASE, AMYLASE in the last 168 hours. No results for input(s): AMMONIA in the last 168  hours. Coagulation Profile: No results for input(s): INR, PROTIME in the last 168 hours. Cardiac Enzymes: No results for input(s): CKTOTAL, CKMB, CKMBINDEX, TROPONINI in the last 168 hours. BNP (last 3 results) No results for input(s): PROBNP in the last 8760 hours. HbA1C: No results for input(s): HGBA1C in the last 72 hours. CBG: No results for input(s): GLUCAP in the last 168 hours. Lipid Profile: No results for input(s): CHOL, HDL, LDLCALC, TRIG, CHOLHDL, LDLDIRECT in the last 72 hours. Thyroid Function Tests: No results for input(s): TSH, T4TOTAL, FREET4, T3FREE, THYROIDAB in the last 72 hours. Anemia Panel: No results for input(s): VITAMINB12, FOLATE, FERRITIN, TIBC, IRON, RETICCTPCT in the last 72 hours. Sepsis Labs: Recent Labs  Lab 03/18/21 2040 03/18/21 2151  LATICACIDVEN 2.6* 2.6*    Recent Results (from the past 240 hour(s))  Resp Panel by RT-PCR (Flu A&B, Covid) Nasopharyngeal Swab     Status: None   Collection Time: 03/18/21  8:44 PM  Specimen: Nasopharyngeal Swab; Nasopharyngeal(NP) swabs in vial transport medium  Result Value Ref Range Status   SARS Coronavirus 2 by RT PCR NEGATIVE NEGATIVE Final    Comment: (NOTE) SARS-CoV-2 target nucleic acids are NOT DETECTED.  The SARS-CoV-2 RNA is generally detectable in upper respiratory specimens during the acute phase of infection. The lowest concentration of SARS-CoV-2 viral copies this assay can detect is 138 copies/mL. A negative result does not preclude SARS-Cov-2 infection and should not be used as the sole basis for treatment or other patient management decisions. A negative result may occur with  improper specimen collection/handling, submission of specimen other than nasopharyngeal swab, presence of viral mutation(s) within the areas targeted by this assay, and inadequate number of viral copies(<138 copies/mL). A negative result must be combined with clinical observations, patient history, and  epidemiological information. The expected result is Negative.  Fact Sheet for Patients:  EntrepreneurPulse.com.au  Fact Sheet for Healthcare Providers:  IncredibleEmployment.be  This test is no t yet approved or cleared by the [place] FDA and  has been authorized for detection and/or diagnosis of SARS-CoV-2 by FDA under an Emergency Use Authorization (EUA). This EUA will remain  in effect (meaning this test can be used) for the duration of the COVID-19 declaration under Section 564(b)(1) of the Act, 21 U.S.C.section 360bbb-3(b)(1), unless the authorization is terminated  or revoked sooner.       Influenza A by PCR NEGATIVE NEGATIVE Final   Influenza B by PCR NEGATIVE NEGATIVE Final    Comment: (NOTE) The Xpert Xpress SARS-CoV-2/FLU/RSV plus assay is intended as an aid in the diagnosis of influenza from Nasopharyngeal swab specimens and should not be used as a sole basis for treatment. Nasal washings and aspirates are unacceptable for Xpert Xpress SARS-CoV-2/FLU/RSV testing.  Fact Sheet for Patients: EntrepreneurPulse.com.au  Fact Sheet for Healthcare Providers: IncredibleEmployment.be  This test is not yet approved or cleared by the [place] FDA and has been authorized for detection and/or diagnosis of SARS-CoV-2 by FDA under an Emergency Use Authorization (EUA). This EUA will remain in effect (meaning this test can be used) for the duration of the COVID-19 declaration under Section 564(b)(1) of the Act, 21 U.S.C. section 360bbb-3(b)(1), unless the authorization is terminated or revoked.  Performed at [facility], [place] [address], [place], [place] [address]   Urine culture     Status: Abnormal   Collection Time: 03/18/21  9:51 PM   Specimen: In/Out Cath Urine  Result Value Ref Range Status   Specimen Description IN/OUT CATH URINE  Final   Special Requests   Final     NONE Performed at [facility], [place] [address] [place], [place] [address]    Culture 40,000 COLONIES/mL PSEUDOMONAS AERUGINOSA (A)  Final   Report Status 03/22/2021 FINAL  Final   Organism ID, Bacteria PSEUDOMONAS AERUGINOSA (A)  Final      Susceptibility   Pseudomonas aeruginosa - MIC*    CEFTAZIDIME 2 SENSITIVE Sensitive     CIPROFLOXACIN <=0.25 SENSITIVE Sensitive     GENTAMICIN <=1 SENSITIVE Sensitive     IMIPENEM 1 SENSITIVE Sensitive     PIP/TAZO <=4 SENSITIVE Sensitive     CEFEPIME 2 SENSITIVE Sensitive     * 40,000 COLONIES/mL PSEUDOMONAS AERUGINOSA  Blood culture (routine x 2)     Status: None (Preliminary result)   Collection Time: 03/18/21 10:30 PM   Specimen: BLOOD  Result Value Ref Range Status   Specimen Description BLOOD RIGHT ARM  Final  Special Requests   Final    BOTTLES DRAWN AEROBIC AND ANAEROBIC Blood Culture adequate volume   Culture   Final    NO GROWTH 2 DAYS Performed at [facility], [place] [address], [place], [place] [address]    Report Status PENDING  Incomplete  Blood culture (routine x 2)     Status: None (Preliminary result)   Collection Time: 03/18/21 10:35 PM   Specimen: BLOOD  Result Value Ref Range Status   Specimen Description BLOOD LEFT ARM  Final   Special Requests   Final    BOTTLES DRAWN AEROBIC ONLY Blood Culture results may not be optimal due to an inadequate volume of blood received in culture bottles   Culture   Final    NO GROWTH 2 DAYS Performed at [facility], [place] [address] [place], [place] [address]    Report Status PENDING  Incomplete  Gastrointestinal Panel by PCR , Stool     Status: Abnormal   Collection Time: 03/20/21  2:08 PM   Specimen: STOOL  Result Value Ref Range Status   Campylobacter species NOT DETECTED NOT DETECTED Final   Plesimonas shigelloides NOT DETECTED NOT DETECTED Final   Salmonella species NOT DETECTED NOT DETECTED Final   Yersinia enterocolitica NOT DETECTED NOT DETECTED Final    Vibrio species NOT DETECTED NOT DETECTED Final   Vibrio cholerae NOT DETECTED NOT DETECTED Final   Enteroaggregative E coli (EAEC) DETECTED (A) NOT DETECTED Final    Comment: RESULT CALLED TO, READ BACK BY AND VERIFIED WITH: REN AGUSTIN AT 1348 ON 03/21/2021 [place].    Enteropathogenic E coli (EPEC) NOT DETECTED NOT DETECTED Final   Enterotoxigenic E coli (ETEC) NOT DETECTED NOT DETECTED Final   Shiga like toxin producing E coli (STEC) NOT DETECTED NOT DETECTED Final   Shigella/Enteroinvasive E coli (EIEC) NOT DETECTED NOT DETECTED Final   Cryptosporidium NOT DETECTED NOT DETECTED Final   Cyclospora cayetanensis NOT DETECTED NOT DETECTED Final   Entamoeba histolytica NOT DETECTED NOT DETECTED Final   Giardia lamblia NOT DETECTED NOT DETECTED Final   Adenovirus F40/41 NOT DETECTED NOT DETECTED Final   Astrovirus NOT DETECTED NOT DETECTED Final   Norovirus GI/GII NOT DETECTED NOT DETECTED Final   Rotavirus A NOT DETECTED NOT DETECTED Final   Sapovirus (I, II, IV, and V) NOT DETECTED NOT DETECTED Final    Comment: Performed at [facility], [address] [place], [place] [address]  C Difficile Quick Screen w PCR reflex     Status: None   Collection Time: 03/20/21  2:14 PM   Specimen: STOOL  Result Value Ref Range Status   C Diff antigen NEGATIVE NEGATIVE Final   C Diff toxin NEGATIVE NEGATIVE Final   C Diff interpretation No C. difficile detected.  Final    Comment: Performed at [facility], [place] [address], [place], [place] [address]         Radiology Studies: DG Chest 1 View  Result Date: 03/20/2021 CLINICAL DATA:  Respiratory failure. EXAM: CHEST  1 VIEW COMPARISON:  Radiograph 03/18/2021. FINDINGS: Mild cardiomegaly. Aortic atherosclerosis and tortuosity. Developing patchy opacity at the right lung base and right mid lung. No pneumothorax. Possible small right pleural effusion. Remote left clavicle fracture. IMPRESSION: Developing patchy opacity at the right lung  base and right mid lung, suspicious for pneumonia. Possible small right pleural effusion. Recommend correlation for aspiration risk factors. Electronically Signed   By: Keith Rake M.D.   On: 03/20/2021  22:08    Scheduled Meds: . amLODipine  5 mg Oral Daily  . apixaban  5 mg Oral BID  . cetaphil   Topical Daily  . diphenhydrAMINE-zinc acetate  1 application Topical BID  . donepezil  5 mg Oral QHS  . hydrOXYzine  25 mg Oral TID  . levETIRAcetam  500 mg Oral BID  . lisinopril  40 mg Oral Daily  . loratadine  10 mg Oral Daily  . melatonin  10 mg Oral QHS  . metoprolol tartrate  50 mg Oral BID  . pravastatin  40 mg Oral q1800  . traZODone  50 mg Oral QHS   Continuous Infusions: . azithromycin 250 mL/hr at 03/21/21 2250  . cefTRIAXone (ROCEPHIN)  IV 2 g (03/21/21 1508)     LOS: 4 days   Time spent: 69min  Hannahgrace Lalli C Coleen [place], DO [facility]  If 7PM-7AM, please contact night-coverage [email or web address]  03/22/2021, 7:42 AM

## 2021-03-22 NOTE — Plan of Care (Signed)
  Problem: Clinical Measurements: Goal: Diagnostic test results will improve Outcome: Completed/Met Goal: Respiratory complications will improve Outcome: Completed/Met Goal: Cardiovascular complication will be avoided Outcome: Completed/Met   

## 2021-03-22 NOTE — TOC Progression Note (Signed)
Transition of Care [facility]) - Progression Note    Patient Details  Name: Kendra Charles MRN: [id number] Date of Birth: 03-May-1941  Transition of Care [facility]) CM/SW Contact  Sharlet Salina Mila Homer, LCSW Phone Number: 03/22/2021, 4:13 PM  Clinical Narrative:  Son Rochel Brome' SNF choice is [facility]. [facility] made a bed offer and call made to [place], admissions director regarding patient and anticipated d/c Thursday, 6/9. [facility] can accept patient and Narda Rutherford talked with son and admissions paperwork has been completed. MD informed via secure chat.      Expected Discharge Plan: Skilled Nursing Facility Barriers to Discharge: SNF Pending bed offer  Expected Discharge Plan and Services Expected Discharge Plan: [place] In-house Referral: Clinical Social Work   Post Acute Care Choice: [place] Living arrangements for the past 2 months: [facility] ([place])                                     Social Determinants of Health (SDOH) Interventions  No SDOH interventions requested or needed at this time.  Readmission Risk Interventions Readmission Risk Prevention Plan 06/19/2019  Transportation Screening Complete  PCP or Specialist Appt within 5-7 Days Complete  Home Care Screening Complete  Medication Review (RN CM) Complete

## 2021-03-22 NOTE — Evaluation (Addendum)
Occupational Therapy Evaluation Patient Details Name: Kendra Charles MRN: [id number] DOB: May 15, 1941 Today's Date: 03/22/2021    History of Present Illness 80 y.o. female  brought in from skilled nursing facility 03/18/21 secondary to worsening mental status.Pt admitted for acute metabolic encephalopathy. PMH: dementia, essential hypertension, hyperlipidemia, seizure disorder, previous COVID-19 infection and hyponatremia.   Clinical Impression   Pt presents with decline in function and safety with ADLs and ADL mobility with impaired strength, balance, endurance and cognition (hx of dementia). Pt is a poor historian, confused and unable to provide PLOF. Pt states that she lives with her sister and doesn't;t know she is on the hospital. PTA pt lived at a SNF; selfcare and mobility PLOF unknown at this time. Pt currently requires min A to sit EOB, min guard A with grooming seated EOB, min A with UB ADLs, max A with LB ADLs, max - total A with toileting tasks, mod A with mobility/transfers. Pt would benefit from acute OT services to address impairments to maximize level of function and safety    Follow Up Recommendations  SNF;Supervision/Assistance - 24 hour    Equipment Recommendations  Other (comment) (TBD at SNF)    Recommendations for Other Services       Precautions / Restrictions Precautions Precautions: Fall Precaution Comments: hx of falls Restrictions Weight Bearing Restrictions: No Other Position/Activity Restrictions: enteric precautions E. coli      Mobility Bed Mobility Overal bed mobility: Needs Assistance Bed Mobility: Supine to Sit;Sit to Supine     Supine to sit: Min assist Sit to supine: Min assist;Mod assist   General bed mobility comments: min A to initiate LEs to EOB at to scoot hips to EOB using pads underneath pt. mod A with LEs back onto bed. Pt able to scoot to [facility] with max multimodal cues for technique to use rails and bend her knees and completion     Transfers Overall transfer level: Needs assistance Equipment used: 1 person hand held assist;Rolling walker (2 wheeled) Transfers: Sit to/from [facility] Sit to Stand: Mod assist Stand pivot transfers: Mod assist       General transfer comment: max A SPT to [facility] and back to bed    Balance Overall balance assessment: Needs assistance Sitting-balance support: Feet supported;No upper extremity supported Sitting balance-Leahy Scale: Fair     Standing balance support: Bilateral upper extremity supported;During functional activity Standing balance-Leahy Scale: Poor                             ADL either performed or assessed with clinical judgement   ADL Overall ADL's : Needs assistance/impaired Eating/Feeding: Set up;Supervision/ safety;Sitting;Bed level   Grooming: Wash/dry hands;Wash/dry face;Min guard;Sitting   Upper Body Bathing: Minimal assistance;Sitting   Lower Body Bathing: Maximal assistance;Cueing for sequencing   Upper Body Dressing : Sitting   Lower Body Dressing: Maximal assistance;Cueing for sequencing   Toilet Transfer: Moderate assistance;Stand-pivot;BSC;Cueing for safety;Cueing for sequencing   Toileting- Clothing Manipulation and Hygiene: Maximal assistance       Functional mobility during ADLs: Moderate assistance;Cueing for safety;Cueing for sequencing;Rolling walker General ADL Comments: uncertain at this time of pt's baseline with ADLs//selfcare     Vision Patient Visual Report: No change from baseline       Perception     Praxis      Pertinent Vitals/Pain Pain Assessment: Faces Faces Pain Scale: Hurts a little bit Pain Location: generalized, bilateral knee with standing Pain Descriptors /  Indicators: Grimacing;Guarding;Discomfort Pain Intervention(s): Monitored during session;Repositioned     Hand Dominance Right   Extremity/Trunk Assessment Upper Extremity Assessment Upper Extremity Assessment:  Overall WFL for tasks assessed   Lower Extremity Assessment Lower Extremity Assessment: Defer to PT evaluation       Communication Communication Communication: HOH   Cognition Arousal/Alertness: Awake/alert Behavior During Therapy: Impulsive;Restless Overall Cognitive Status: History of cognitive impairments - at baseline                                 General Comments: unable to provide PLOF   General Comments       Exercises     Shoulder Instructions      Home Living Family/patient expects to be discharged to:: Skilled nursing facility                                 Additional Comments: pt still thinks she is living with her sister, unable to provide PLOF or home set up      Prior Functioning/Environment Level of Independence: Needs assistance  Gait / Transfers Assistance Needed: uncertain at this time ADL's / Homemaking Assistance Needed: lives at a SNF but pt is a poor historian and unable to provide selfcare PLOF            OT Problem List: Decreased cognition;Impaired balance (sitting and/or standing);Decreased strength;Pain;Decreased safety awareness;Decreased activity tolerance;Decreased knowledge of use of DME or AE      OT Treatment/Interventions: Self-care/ADL training;DME and/or AE instruction;Therapeutic activities;Balance training;Therapeutic exercise;Patient/family education    OT Goals(Current goals can be found in the care plan section) Acute Rehab OT Goals Patient Stated Goal: "go to my other room" OT Goal Formulation: With patient Time For Goal Achievement: 04/05/21 Potential to Achieve Goals: Good ADL Goals Pt Will Perform Grooming: with set-up;with supervision;sitting Pt Will Perform Upper Body Bathing: with supervision;with set-up;sitting Pt Will Perform Lower Body Bathing: with mod assist;with min assist;sitting/lateral leans Pt Will Perform Upper Body Dressing: with supervision;with set-up;sitting Pt Will  Transfer to Toilet: with min assist;with min guard assist;ambulating;stand pivot transfer;bedside commode  OT Frequency: Min 2X/week   Barriers to D/C:            Co-evaluation              AM-PAC OT "6 Clicks" Daily Activity     Outcome Measure Help from another person eating meals?: None Help from another person taking care of personal grooming?: A Little Help from another person toileting, which includes using toliet, bedpan, or urinal?: Total Help from another person bathing (including washing, rinsing, drying)?: A Lot Help from another person to put on and taking off regular upper body clothing?: A Little Help from another person to put on and taking off regular lower body clothing?: A Lot 6 Click Score: 15   End of Session Equipment Utilized During Treatment: Gait belt;Rolling walker;Other (comment) (BSC)  Activity Tolerance: Patient limited by fatigue Patient left: with call bell/phone within reach;in bed;with bed alarm set  OT Visit Diagnosis: Unsteadiness on feet (R26.81);Other abnormalities of gait and mobility (R26.89);Muscle weakness (generalized) (M62.81);Other symptoms and signs involving cognitive function                Time: 0950-1016 OT Time Calculation (min): 26 min Charges:  OT General Charges $OT Visit: 1 Visit OT Evaluation $OT Eval Moderate Complexity: 1 Mod OT  Treatments $Self Care/Home Management : 8-22 mins    Emmit Alexanders [facility] 03/22/2021, 1:17 PM

## 2021-03-22 NOTE — NC FL2 (Signed)
[place] LEVEL OF CARE SCREENING TOOL     IDENTIFICATION  Patient Name: Kendra Charles Birthdate: 05/12/1941 Sex: female Admission Date (Current Location): 03/18/2021  [place] and [place] Number:  Kendra Charles [id number] [place] and Address:  [facility] [place], [place] [address], [place],  [address]      Provider Number: [id number]  Attending Physician Name and Address:  Little Ishikawa, MD  Relative Name and Phone Number:  Kendra Charles - son, [phone]    Current Level of Care: Hospital Recommended Level of Care: [place] Prior Approval Number:    Date Approved/Denied:   PASRR Number: [id number]  Discharge Plan: SNF    Current Diagnoses: Patient Active Problem List   Diagnosis Date Noted  . A-fib ([place]) 03/19/2021  . Hypokalemia 03/18/2021  . Leucocytosis 03/18/2021  . Acute metabolic encephalopathy 67/34/1937  . Acute hypoxemic respiratory failure due to COVID-19 ([place]) 09/04/2019  . Seizure ([place]) 06/19/2019  . Hyponatremia 06/18/2019  . AKI (acute kidney injury) ([place]) 06/18/2019  . Dementia ([place]) 06/18/2019  . Essential hypertension 06/18/2019  . Hyperlipidemia 06/18/2019    Orientation RESPIRATION BLADDER Height & Weight     Self  O2 (2 Liters oxygen) Incontinent,External catheter (Catheter placed 6/5) Weight: 129 lb 6.6 oz (58.7 kg) Height:  5\' 5"  (165.1 cm)  BEHAVIORAL SYMPTOMS/MOOD NEUROLOGICAL BOWEL NUTRITION STATUS      Continent Diet (Heart healthy)  AMBULATORY STATUS COMMUNICATION OF NEEDS Skin   Total Care (Did not ambulate with PT during evaluation on 6/7) Verbally Other (Comment) (Ecchymosis arm. leg, chest; Excoriated skin right/left arm and chest; skin dry and itching - benedryl oinment applied)                       Personal Care Assistance Level of Assistance  Bathing,Feeding,Dressing Bathing Assistance: Limited assistance Feeding assistance: Independent Dressing Assistance:  Limited assistance     Functional Limitations Info  Sight,Hearing,Speech Sight Info: Adequate Hearing Info: Impaired Speech Info: Adequate    SPECIAL CARE FACTORS FREQUENCY  PT (By licensed PT)     PT Frequency: Evaluated 6/7. 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 acetaminophen (TYLENOL) tablet 325 mg  325 mg Oral Q4H PRN Little Ishikawa, MD   325 mg at 03/20/21 2023  . alum & mag hydroxide-simeth (MAALOX/MYLANTA) 200-200-20 MG/5ML suspension 20 mL  20 mL Oral TID PRN Gala Romney L, MD      . amLODipine (NORVASC) tablet 5 mg  5 mg Oral Daily Gala Romney L, MD   5 mg at 03/22/21 1016  . apixaban (ELIQUIS) tablet 5 mg  5 mg Oral BID Gala Romney L, MD   5 mg at 03/22/21 1016  . azithromycin (ZITHROMAX) tablet 500 mg  500 mg Oral QHS Little Ishikawa, MD      . cefTRIAXone (ROCEPHIN) 2 g in sodium chloride 0.9 % 100 mL IVPB  2 g Intravenous Q24H Shalhoub, Sherryll Burger, MD 200 mL/hr  at 03/21/21 1508 2 g at 03/21/21 1508  . cetaphil lotion   Topical Daily Little Ishikawa, MD   Given at 03/22/21 1017  . diclofenac Sodium (VOLTAREN) 1 % topical gel 4 g  4 g Topical QID PRN Elwyn Reach, MD      . diphenhydrAMINE-zinc acetate (BENADRYL) 6-7.3 % cream 1 application  1 application Topical BID Elwyn Reach, MD   1 application at 41/93/79 1017  . donepezil (ARICEPT) tablet 5 mg  5 mg Oral QHS Gala Romney L, MD   5 mg at 03/21/21 2227  . hydrOXYzine (ATARAX/VISTARIL) tablet 25 mg   25 mg Oral TID Elwyn Reach, MD   25 mg at 03/22/21 1016  . levETIRAcetam (KEPPRA) tablet 500 mg  500 mg Oral BID Gala Romney L, MD   500 mg at 03/22/21 1016  . lisinopril (ZESTRIL) tablet 40 mg  40 mg Oral Daily Gala Romney L, MD   40 mg at 03/22/21 1016  . loperamide (IMODIUM) capsule 2 mg  2 mg Oral PRN Elwyn Reach, MD      . loratadine (CLARITIN) tablet 10 mg  10 mg Oral Daily Gala Romney L, MD   10 mg at 03/22/21 1016  . magnesium hydroxide (MILK OF MAGNESIA) suspension 30 mL  30 mL Oral QHS PRN Gala Romney L, MD      . melatonin tablet 10 mg  10 mg Oral QHS Elwyn Reach, MD   10 mg at 03/21/21 2226  . metoprolol tartrate (LOPRESSOR) tablet 50 mg  50 mg Oral BID Gala Romney L, MD   50 mg at 03/22/21 1016  . ondansetron (ZOFRAN) tablet 4 mg  4 mg Oral Q6H PRN Elwyn Reach, MD       Or  . ondansetron (ZOFRAN) injection 4 mg  4 mg Intravenous Q6H PRN Gala Romney L, MD      . pravastatin (PRAVACHOL) tablet 40 mg  40 mg Oral q1800 Elwyn Reach, MD   40 mg at 03/21/21 1743  . traMADol (ULTRAM) tablet 50 mg  50 mg Oral BID PRN Elwyn Reach, MD   50 mg at 03/21/21 1902  . traZODone (DESYREL) tablet 50 mg  50 mg Oral QHS Elwyn Reach, MD   50 mg at 03/21/21 2226     Discharge Medications: Please see discharge summary for a list of discharge medications.  Relevant Imaging Results:  Relevant Lab Results:   Additional Information ss#[id number]. Patient has been vaccinated twice and one booster, & the ALF has vaccination record.  Sable Feil, LCSW

## 2021-03-23 LAB — COMPREHENSIVE METABOLIC PANEL
ALT: 51 U/L — ABNORMAL HIGH (ref 0–44)
AST: 87 U/L — ABNORMAL HIGH (ref 15–41)
Albumin: 2.4 g/dL — ABNORMAL LOW (ref 3.5–5.0)
Alkaline Phosphatase: 59 U/L (ref 38–126)
Anion gap: 8 (ref 5–15)
BUN: 8 mg/dL (ref 8–23)
CO2: 26 mmol/L (ref 22–32)
Calcium: 8.7 mg/dL — ABNORMAL LOW (ref 8.9–10.3)
Chloride: 100 mmol/L (ref 98–111)
Creatinine, Ser: 0.6 mg/dL (ref 0.44–1.00)
GFR, Estimated: >60 mL/min (ref >60)
Glucose, Bld: 115 mg/dL — ABNORMAL HIGH (ref 70–99)
Potassium: 3.7 mmol/L (ref 3.5–5.1)
Sodium: 134 mmol/L — ABNORMAL LOW (ref 135–145)
Total Bilirubin: 0.6 mg/dL (ref 0.3–1.2)
Total Protein: 5.3 g/dL — ABNORMAL LOW (ref 6.5–8.1)

## 2021-03-23 LAB — CBC
HCT: 34.1 % — ABNORMAL LOW (ref 36.0–46.0)
Hemoglobin: 11.6 g/dL — ABNORMAL LOW (ref 12.0–15.0)
MCH: 30.1 pg (ref 26.0–34.0)
MCHC: 34 g/dL (ref 30.0–36.0)
MCV: 88.6 fL (ref 80.0–100.0)
Platelets: 317 10*3/uL (ref 150–400)
RBC: 3.85 MIL/uL — ABNORMAL LOW (ref 3.87–5.11)
RDW: 13.6 % (ref 11.5–15.5)
WBC: 17 10*3/uL — ABNORMAL HIGH (ref 4.0–10.5)
nRBC: 0 % (ref 0.0–0.2)

## 2021-03-23 LAB — LEVETIRACETAM LEVEL: Levetiracetam Lvl: 38.1 ug/mL (ref 10.0–40.0)

## 2021-03-23 MED ORDER — TRAMADOL HCL 50 MG PO TABS: 50.0000 mg | ORAL_TABLET | Freq: Two times a day (BID) | ORAL | 0 refills | Status: DC | PRN

## 2021-03-23 NOTE — Discharge Summary (Signed)
Physician Discharge Summary  Kendra Charles AST:[id number] DOB: 1941-08-19 DOA: 03/18/2021  PCP: Patient, No Pcp Per (Inactive)  Admit date: 03/18/2021 Discharge date: 03/23/2021 30 Day Unplanned Readmission Risk Score    Flowsheet Row ED to Hosp-Admission (Current) from 03/18/2021 in [facility]  30 Day Unplanned Readmission Risk Score (%) 17.87 Filed at 03/23/2021 0801       This score is the patient's risk of an unplanned readmission within 30 days of being discharged (0 -100%). The score is based on dignosis, age, lab data, medications, orders, and past utilization.   Low:  0-14.9   Medium: 15-21.9   High: 22-29.9   Extreme: 30 and above           Admitted From: SNF Disposition: SNF  Recommendations for Outpatient Follow-up:  Follow up with PCP in 1-2 weeks Please obtain BMP/CBC in one week Please follow up with your PCP on the following pending results: Unresulted Labs (From admission, onward)     Start     Ordered   03/20/21 0500  CBC  Daily,   R      03/19/21 1312   03/20/21 0500  Comprehensive metabolic panel  Daily,   R      03/19/21 1312   03/18/21 2045  Levetiracetam level  ONCE - STAT,   STAT        03/18/21 2044              Home Health: None Equipment/Devices: None  Discharge Condition: Stable CODE STATUS: Full code Diet recommendation: Dysphagia 3 diet  Subjective: Seen and examined.  Alert and partly oriented.  No complaints.  Brief/Interim Summary: Kendra Charles is a 80 y.o. female with medical history significant of dementia, essential hypertension, hyperlipidemia, seizure disorder, previous COVID-19 infection and hyponatremia among other things who was brought in from skilled nursing facility secondary to acute mental status changes.  No seizure-like activity was noted, some complaints of increased urinary frequency and dysuria at intake concerning for UTI.  She was admitted to hospital service due to severe sepsis as well as acute  hypoxic respiratory failure secondary to aspiration pneumonia as well as UTI.  She was started on Rocephin and Zithromax.  She has received 5 days of Rocephin and 3 days of Zithromax.  Patient's encephalopathy has improved.  She has dementia.  She is alert and partially oriented, likely at her baseline due to her dementia.  She is currently on 2 L of oxygen.  She will likely need to be discharged on same amount of oxygen to SNF.  She also had hypokalemia which was replaced and her hydrochlorothiazide was discontinued.  Rest of the antihypertensives were continued and her blood pressure has remained stable so at this point in time I am going to discontinue her hydrochlorothiazide.  She also came in with AKI due to prerenal cause and she received IV fluids and her AKI resolved as well.  She is being discharged to skilled nursing facility in a stable condition with dysphagia 3 diet.  She will need to see SLP over there as well.  Discharge Diagnoses:  Principal Problem:   Acute metabolic encephalopathy Active Problems:   AKI (acute kidney injury) ([place])   Dementia ([place])   Essential hypertension   Hyperlipidemia   Seizure ([place])   Hypokalemia   Leucocytosis   A-fib ([facility])    Discharge Instructions   Allergies as of 03/23/2021   No Known Allergies      Medication List  STOP taking these medications    hydrochlorothiazide 25 MG tablet Commonly known as: HYDRODIURIL       TAKE these medications    acetaminophen 325 MG tablet Commonly known as: TYLENOL Take 650 mg by mouth 2 (two) times daily.   alum & mag hydroxide-simeth 200-200-20 MG/5ML suspension Commonly known as: MAALOX/MYLANTA Take 20 mLs by mouth 3 (three) times daily as needed for indigestion.   amLODipine 5 MG tablet Commonly known as: NORVASC Take 5 mg by mouth daily.   apixaban 5 MG Tabs tablet Commonly known as: ELIQUIS Take 1 tablet (5 mg total) by mouth 2 (two) times daily.   Benadryl Itch Stopping  cream Generic drug: diphenhydrAMINE-zinc acetate Apply 1 application topically 2 (two) times daily. Apply to rash on chest and back   diclofenac Sodium 1 % Gel Commonly known as: VOLTAREN Apply 4 g topically 4 (four) times daily as needed (right knee pain).   donepezil 5 MG tablet Commonly known as: ARICEPT Take 5 mg by mouth at bedtime.   hydrOXYzine 25 MG tablet Commonly known as: ATARAX/VISTARIL Take 25 mg by mouth 3 (three) times daily. For itching   ivermectin 3 MG Tabs tablet Commonly known as: STROMECTOL Take 12 mg by mouth every Wednesday.   levETIRAcetam 500 MG tablet Commonly known as: KEPPRA Take 1 tablet (500 mg total) by mouth 2 (two) times daily.   lisinopril 40 MG tablet Commonly known as: ZESTRIL Take 40 mg by mouth daily.   loperamide 2 MG capsule Commonly known as: IMODIUM Take 2 mg by mouth as needed for diarrhea or loose stools (not to exceed 8 doses in 24 hours).   loratadine 10 MG tablet Commonly known as: CLARITIN Take 10 mg by mouth daily.   lovastatin 40 MG tablet Commonly known as: MEVACOR Take 40 mg by mouth at bedtime.   magnesium hydroxide 400 MG/5ML suspension Commonly known as: MILK OF MAGNESIA Take 30 mLs by mouth at bedtime as needed (constipation).   Melatonin-Theanine 10-5.5 MG Tabs Take 1 tablet by mouth at bedtime.   metoprolol tartrate 50 MG tablet Commonly known as: LOPRESSOR Take 50 mg by mouth 2 (two) times daily.   ondansetron 4 MG tablet Commonly known as: ZOFRAN Take 4 mg by mouth every 6 (six) hours as needed for nausea or vomiting.   traMADol 50 MG tablet Commonly known as: ULTRAM Take 1 tablet (50 mg total) by mouth 2 (two) times daily as needed (pain).   traZODone 50 MG tablet Commonly known as: DESYREL Take 50 mg by mouth at bedtime.        No Known Allergies  Consultations: None   Procedures/Studies: DG Chest 1 View  Result Date: 03/20/2021 CLINICAL DATA:  Respiratory failure. EXAM: CHEST  1  VIEW COMPARISON:  Radiograph 03/18/2021. FINDINGS: Mild cardiomegaly. Aortic atherosclerosis and tortuosity. Developing patchy opacity at the right lung base and right mid lung. No pneumothorax. Possible small right pleural effusion. Remote left clavicle fracture. IMPRESSION: Developing patchy opacity at the right lung base and right mid lung, suspicious for pneumonia. Possible small right pleural effusion. Recommend correlation for aspiration risk factors. Electronically Signed   By: Keith Rake M.D.   On: 03/20/2021 22:08   CT HEAD WO CONTRAST  Result Date: 03/18/2021 CLINICAL DATA:  Altered mental status EXAM: CT HEAD WITHOUT CONTRAST TECHNIQUE: Contiguous axial images were obtained from the base of the skull through the vertex without intravenous contrast. COMPARISON:  Head CT dated 09/15/2020 FINDINGS: Brain: Generalized parenchymal volume loss  with commensurate dilatation of the ventricles and sulci. Ventricles are stable in size and configuration. 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 Chronic small vessel ischemic changes again noted within the bilateral periventricular and subcortical white matter regions. No mass, hemorrhage, edema or other evidence of acute parenchymal abnormality. No extra-axial hemorrhage. Vascular: Chronic calcified atherosclerotic changes of the large vessels at the skull base. No unexpected hyperdense vessel. Skull: Normal. Negative for fracture or focal lesion. Sinuses/Orbits: No acute finding. Other: None. IMPRESSION: 1. No acute findings. No intracranial mass, hemorrhage or edema. 2. 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 3. Chronic small vessel ischemic changes in the white matter. Electronically Signed   By: Franki Cabot M.D.   On: 03/18/2021 22:30   DG Chest Port 1 View  Result Date: 03/18/2021 CLINICAL DATA:  Altered mental status EXAM: PORTABLE CHEST 1 VIEW COMPARISON:  Chest x-rays dated 09/04/2019 and 06/18/2019.  FINDINGS: Heart size and mediastinal contours are stable. Lungs are clear. No pleural effusion or pneumothorax is seen. No acute-appearing osseous abnormality. IMPRESSION: No active disease. No evidence of pneumonia or pulmonary edema. Electronically Signed   By: Franki Cabot M.D.   On: 03/18/2021 22:31     Discharge Exam: Vitals:   03/23/21 0431 03/23/21 0853  BP: 129/90 105/74  Pulse: (!) 110 78  Resp: 16 20  Temp: 98.4 F (36.9 C) 99.5 F (37.5 C)  SpO2: 94% 92%   Vitals:   03/22/21 1649 03/22/21 2008 03/23/21 0431 03/23/21 0853  BP: (!) 122/93 133/89 129/90 105/74  Pulse: 98 (!) 108 (!) 110 78  Resp: 19 16 16 20   Temp: 99 F (37.2 C) 98.7 F (37.1 C) 98.4 F (36.9 C) 99.5 F (37.5 C)  TempSrc:  Oral Oral Oral  SpO2: 93% 94% 94% 92%  Weight:      Height:        General: Pt is alert, awake, not in acute distress Cardiovascular: RRR, S1/S2 +, no rubs, no gallops Respiratory: CTA bilaterally, no wheezing, no rhonchi Abdominal: Soft, NT, ND, bowel sounds + Extremities: no edema, no cyanosis    The results of significant diagnostics from this hospitalization (including imaging, microbiology, ancillary and laboratory) are listed below for reference.     Microbiology: Recent Results (from the past 240 hour(s))  Resp Panel by RT-PCR (Flu A&B, Covid) Nasopharyngeal Swab     Status: None   Collection Time: 03/18/21  8:44 PM   Specimen: Nasopharyngeal Swab; Nasopharyngeal(NP) swabs in vial transport medium  Result Value Ref Range Status   SARS Coronavirus 2 by RT PCR NEGATIVE NEGATIVE Final    Comment: (NOTE) SARS-CoV-2 target nucleic acids are NOT DETECTED.  The SARS-CoV-2 RNA is generally detectable in upper respiratory specimens during the acute phase of infection. The lowest concentration of SARS-CoV-2 viral copies this assay can detect is 138 copies/mL. A negative result does not preclude SARS-Cov-2 infection and should not be used as the sole basis for treatment  or other patient management decisions. A negative result may occur with  improper specimen collection/handling, submission of specimen other than nasopharyngeal swab, presence of viral mutation(s) within the areas targeted by this assay, and inadequate number of viral copies(<138 copies/mL). A negative result must be combined with clinical observations, patient history, and epidemiological information. The expected result is Negative.  Fact Sheet for Patients:  EntrepreneurPulse.com.au  Fact Sheet for Healthcare Providers:  IncredibleEmployment.be  This test is no t yet approved or cleared by the [place] FDA and  has been  authorized for detection and/or diagnosis of SARS-CoV-2 by FDA under an Emergency Use Authorization (EUA). This EUA will remain  in effect (meaning this test can be used) for the duration of the COVID-19 declaration under Section 564(b)(1) of the Act, 21 U.S.C.section 360bbb-3(b)(1), unless the authorization is terminated  or revoked sooner.       Influenza A by PCR NEGATIVE NEGATIVE Final   Influenza B by PCR NEGATIVE NEGATIVE Final    Comment: (NOTE) The Xpert Xpress SARS-CoV-2/FLU/RSV plus assay is intended as an aid in the diagnosis of influenza from Nasopharyngeal swab specimens and should not be used as a sole basis for treatment. Nasal washings and aspirates are unacceptable for Xpert Xpress SARS-CoV-2/FLU/RSV testing.  Fact Sheet for Patients: EntrepreneurPulse.com.au  Fact Sheet for Healthcare Providers: IncredibleEmployment.be  This test is not yet approved or cleared by the [place] FDA and has been authorized for detection and/or diagnosis of SARS-CoV-2 by FDA under an Emergency Use Authorization (EUA). This EUA will remain in effect (meaning this test can be used) for the duration of the COVID-19 declaration under Section 564(b)(1) of the Act, 21 U.S.C. section  360bbb-3(b)(1), unless the authorization is terminated or revoked.  Performed at [facility], [place] [address], [place], [place] [address]   Urine culture     Status: Abnormal   Collection Time: 03/18/21  9:51 PM   Specimen: In/Out Cath Urine  Result Value Ref Range Status   Specimen Description IN/OUT CATH URINE  Final   Special Requests   Final    NONE Performed at [facility], [place] [address], [place], [place] [address]    Culture 40,000 COLONIES/mL PSEUDOMONAS AERUGINOSA (A)  Final   Report Status 03/22/2021 FINAL  Final   Organism ID, Bacteria PSEUDOMONAS AERUGINOSA (A)  Final      Susceptibility   Pseudomonas aeruginosa - MIC*    CEFTAZIDIME 2 SENSITIVE Sensitive     CIPROFLOXACIN <=0.25 SENSITIVE Sensitive     GENTAMICIN <=1 SENSITIVE Sensitive     IMIPENEM 1 SENSITIVE Sensitive     PIP/TAZO <=4 SENSITIVE Sensitive     CEFEPIME 2 SENSITIVE Sensitive     * 40,000 COLONIES/mL PSEUDOMONAS AERUGINOSA  Blood culture (routine x 2)     Status: None (Preliminary result)   Collection Time: 03/18/21 10:30 PM   Specimen: BLOOD  Result Value Ref Range Status   Specimen Description BLOOD RIGHT ARM  Final   Special Requests   Final    BOTTLES DRAWN AEROBIC AND ANAEROBIC Blood Culture adequate volume   Culture   Final    NO GROWTH 4 DAYS Performed at [facility], [address], [place], [place] [address]    Report Status PENDING  Incomplete  Blood culture (routine x 2)     Status: None (Preliminary result)   Collection Time: 03/18/21 10:35 PM   Specimen: BLOOD  Result Value Ref Range Status   Specimen Description BLOOD LEFT ARM  Final   Special Requests   Final    BOTTLES DRAWN AEROBIC ONLY Blood Culture results may not be optimal due to an inadequate volume of blood received in culture bottles   Culture   Final    NO GROWTH 4 DAYS Performed at [facility], [place] [address] [place], [place] [address]    Report Status PENDING  Incomplete   Gastrointestinal Panel by PCR , Stool     Status: Abnormal   Collection Time: 03/20/21  2:08 PM  Specimen: STOOL  Result Value Ref Range Status   Campylobacter species NOT DETECTED NOT DETECTED Final   Plesimonas shigelloides NOT DETECTED NOT DETECTED Final   Salmonella species NOT DETECTED NOT DETECTED Final   Yersinia enterocolitica NOT DETECTED NOT DETECTED Final   Vibrio species NOT DETECTED NOT DETECTED Final   Vibrio cholerae NOT DETECTED NOT DETECTED Final   Enteroaggregative E coli (EAEC) DETECTED (A) NOT DETECTED Final    Comment: RESULT CALLED TO, READ BACK BY AND VERIFIED WITH: REN AGUSTIN AT 1348 ON 03/21/2021 [place].    Enteropathogenic E coli (EPEC) NOT DETECTED NOT DETECTED Final   Enterotoxigenic E coli (ETEC) NOT DETECTED NOT DETECTED Final   Shiga like toxin producing E coli (STEC) NOT DETECTED NOT DETECTED Final   Shigella/Enteroinvasive E coli (EIEC) NOT DETECTED NOT DETECTED Final   Cryptosporidium NOT DETECTED NOT DETECTED Final   Cyclospora cayetanensis NOT DETECTED NOT DETECTED Final   Entamoeba histolytica NOT DETECTED NOT DETECTED Final   Giardia lamblia NOT DETECTED NOT DETECTED Final   Adenovirus F40/41 NOT DETECTED NOT DETECTED Final   Astrovirus NOT DETECTED NOT DETECTED Final   Norovirus GI/GII NOT DETECTED NOT DETECTED Final   Rotavirus A NOT DETECTED NOT DETECTED Final   Sapovirus (I, II, IV, and V) NOT DETECTED NOT DETECTED Final    Comment: Performed at [facility], [address] [place], [place] [address]  C Difficile Quick Screen w PCR reflex     Status: None   Collection Time: 03/20/21  2:14 PM   Specimen: STOOL  Result Value Ref Range Status   C Diff antigen NEGATIVE NEGATIVE Final   C Diff toxin NEGATIVE NEGATIVE Final   C Diff interpretation No C. difficile detected.  Final    Comment: Performed at [facility], [place] [address], [place], [place] [address]  SARS CORONAVIRUS 2 (TAT 6-24 HRS) Nasopharyngeal Nasopharyngeal Swab      Status: None   Collection Time: 03/22/21  2:35 PM   Specimen: Nasopharyngeal Swab  Result Value Ref Range Status   SARS Coronavirus 2 NEGATIVE NEGATIVE Final    Comment: (NOTE) SARS-CoV-2 target nucleic acids are NOT DETECTED.  The SARS-CoV-2 RNA is generally detectable in upper and lower respiratory specimens during the acute phase of infection. Negative results do not preclude SARS-CoV-2 infection, do not rule out co-infections with other pathogens, and should not be used as the sole basis for treatment or other patient management decisions. Negative results must be combined with clinical observations, patient history, and epidemiological information. The expected result is Negative.  Fact Sheet for Patients: SugarRoll.be  Fact Sheet for Healthcare Providers: https://www.woods-mathews.com/  This test is not yet approved or cleared by the [place] FDA and  has been authorized for detection and/or diagnosis of SARS-CoV-2 by FDA under an Emergency Use Authorization (EUA). This EUA will remain  in effect (meaning this test can be used) for the duration of the COVID-19 declaration under Se ction 564(b)(1) of the Act, 21 U.S.C. section 360bbb-3(b)(1), unless the authorization is terminated or revoked sooner.  Performed at [facility], [place] [address], [place], [place] [address]      Labs: BNP (last 3 results) No results for input(s): BNP in the last 8760 hours. Basic Metabolic Panel: Recent Labs  Lab 03/19/21 0500 03/20/21 0509 03/21/21 0300 03/22/21 0433 03/23/21 0223  NA 133* 135 134* 136 134*  K 3.3* 3.6 2.8* 3.5 3.7  CL 98 104 100 100 100  CO2 22  22 26 28 26   GLUCOSE 103* 108* 105* 93 115*  BUN 12 17 15 9 8   CREATININE 0.80 0.70 0.64 0.65 0.60  CALCIUM 9.0 8.8* 8.4* 8.7* 8.7*   Liver Function Tests: Recent Labs  Lab 03/19/21 0500 03/20/21 0509 03/21/21 0300 03/22/21 0433 03/23/21 0223  AST 40 51* 36  44* 87*  ALT 16 23 21 28  51*  ALKPHOS 73 59 73 65 59  BILITOT 1.2 1.0 0.5 0.7 0.6  PROT 6.1* 5.4* 5.1* 5.4* 5.3*  ALBUMIN 3.3* 2.7* 2.4* 2.5* 2.4*   No results for input(s): LIPASE, AMYLASE in the last 168 hours. No results for input(s): AMMONIA in the last 168 hours. CBC: Recent Labs  Lab 03/18/21 2040 03/18/21 2117 03/19/21 0500 03/20/21 0509 03/21/21 0300 03/22/21 0433 03/23/21 0223  WBC 26.9*  --  27.8* 23.9* 21.8* 17.8* 17.0*  NEUTROABS 22.9*  --   --   --   --   --   --   HGB 12.6   < > 12.9 12.1 10.6* 10.9* 11.6*  HCT 36.8   < > 38.9 35.6* 32.4* 32.7* 34.1*  MCV 88.0  --  91.1 89.2 90.8 90.1 88.6  PLT 265  --  230 208 243 257 317   < > = values in this interval not displayed.   Cardiac Enzymes: No results for input(s): CKTOTAL, CKMB, CKMBINDEX, TROPONINI in the last 168 hours. BNP: Invalid input(s): POCBNP CBG: No results for input(s): GLUCAP in the last 168 hours. D-Dimer No results for input(s): DDIMER in the last 72 hours. Hgb A1c No results for input(s): HGBA1C in the last 72 hours. Lipid Profile No results for input(s): CHOL, HDL, LDLCALC, TRIG, CHOLHDL, LDLDIRECT in the last 72 hours. Thyroid function studies No results for input(s): TSH, T4TOTAL, T3FREE, THYROIDAB in the last 72 hours.  Invalid input(s): FREET3 Anemia work up No results for input(s): VITAMINB12, FOLATE, FERRITIN, TIBC, IRON, RETICCTPCT in the last 72 hours. Urinalysis    Component Value Date/Time   COLORURINE YELLOW 03/18/2021 2040   APPEARANCEUR HAZY (A) 03/18/2021 2040   LABSPEC 1.017 03/18/2021 2040   PHURINE 5.0 03/18/2021 2040   GLUCOSEU NEGATIVE 03/18/2021 2040   HGBUR SMALL (A) 03/18/2021 2040   BILIRUBINUR NEGATIVE 03/18/2021 2040   KETONESUR NEGATIVE 03/18/2021 2040   PROTEINUR 30 (A) 03/18/2021 2040   NITRITE NEGATIVE 03/18/2021 2040   LEUKOCYTESUR MODERATE (A) 03/18/2021 2040   Sepsis Labs Invalid input(s): PROCALCITONIN,  WBC,  LACTICIDVEN Microbiology Recent  Results (from the past 240 hour(s))  Resp Panel by RT-PCR (Flu A&B, Covid) Nasopharyngeal Swab     Status: None   Collection Time: 03/18/21  8:44 PM   Specimen: Nasopharyngeal Swab; Nasopharyngeal(NP) swabs in vial transport medium  Result Value Ref Range Status   SARS Coronavirus 2 by RT PCR NEGATIVE NEGATIVE Final    Comment: (NOTE) SARS-CoV-2 target nucleic acids are NOT DETECTED.  The SARS-CoV-2 RNA is generally detectable in upper respiratory specimens during the acute phase of infection. The lowest concentration of SARS-CoV-2 viral copies this assay can detect is 138 copies/mL. A negative result does not preclude SARS-Cov-2 infection and should not be used as the sole basis for treatment or other patient management decisions. A negative result may occur with  improper specimen collection/handling, submission of specimen other than nasopharyngeal swab, presence of viral mutation(s) within the areas targeted by this assay, and inadequate number of viral copies(<138 copies/mL). A negative result must be combined with clinical observations, patient history, and epidemiological information.  The expected result is Negative.  Fact Sheet for Patients:  EntrepreneurPulse.com.au  Fact Sheet for Healthcare Providers:  IncredibleEmployment.be  This test is no t yet approved or cleared by the [place] FDA and  has been authorized for detection and/or diagnosis of SARS-CoV-2 by FDA under an Emergency Use Authorization (EUA). This EUA will remain  in effect (meaning this test can be used) for the duration of the COVID-19 declaration under Section 564(b)(1) of the Act, 21 U.S.C.section 360bbb-3(b)(1), unless the authorization is terminated  or revoked sooner.       Influenza A by PCR NEGATIVE NEGATIVE Final   Influenza B by PCR NEGATIVE NEGATIVE Final    Comment: (NOTE) The Xpert Xpress SARS-CoV-2/FLU/RSV plus assay is intended as an aid in the  diagnosis of influenza from Nasopharyngeal swab specimens and should not be used as a sole basis for treatment. Nasal washings and aspirates are unacceptable for Xpert Xpress SARS-CoV-2/FLU/RSV testing.  Fact Sheet for Patients: EntrepreneurPulse.com.au  Fact Sheet for Healthcare Providers: IncredibleEmployment.be  This test is not yet approved or cleared by the [place] FDA and has been authorized for detection and/or diagnosis of SARS-CoV-2 by FDA under an Emergency Use Authorization (EUA). This EUA will remain in effect (meaning this test can be used) for the duration of the COVID-19 declaration under Section 564(b)(1) of the Act, 21 U.S.C. section 360bbb-3(b)(1), unless the authorization is terminated or revoked.  Performed at [facility], [place] [address] [place], [place] [address]   Urine culture     Status: Abnormal   Collection Time: 03/18/21  9:51 PM   Specimen: In/Out Cath Urine  Result Value Ref Range Status   Specimen Description IN/OUT CATH URINE  Final   Special Requests   Final    NONE Performed at [facility], [place] [address] [place], [place] [address]    Culture 40,000 COLONIES/mL PSEUDOMONAS AERUGINOSA (A)  Final   Report Status 03/22/2021 FINAL  Final   Organism ID, Bacteria PSEUDOMONAS AERUGINOSA (A)  Final      Susceptibility   Pseudomonas aeruginosa - MIC*    CEFTAZIDIME 2 SENSITIVE Sensitive     CIPROFLOXACIN <=0.25 SENSITIVE Sensitive     GENTAMICIN <=1 SENSITIVE Sensitive     IMIPENEM 1 SENSITIVE Sensitive     PIP/TAZO <=4 SENSITIVE Sensitive     CEFEPIME 2 SENSITIVE Sensitive     * 40,000 COLONIES/mL PSEUDOMONAS AERUGINOSA  Blood culture (routine x 2)     Status: None (Preliminary result)   Collection Time: 03/18/21 10:30 PM   Specimen: BLOOD  Result Value Ref Range Status   Specimen Description BLOOD RIGHT ARM  Final   Special Requests   Final    BOTTLES DRAWN AEROBIC AND ANAEROBIC Blood  Culture adequate volume   Culture   Final    NO GROWTH 4 DAYS Performed at [facility], [address], [place], [place] [address]    Report Status PENDING  Incomplete  Blood culture (routine x 2)     Status: None (Preliminary result)   Collection Time: 03/18/21 10:35 PM   Specimen: BLOOD  Result Value Ref Range Status   Specimen Description BLOOD LEFT ARM  Final   Special Requests   Final    BOTTLES DRAWN AEROBIC ONLY Blood Culture results may not be optimal due to an inadequate volume of blood received in culture bottles   Culture   Final    NO GROWTH 4 DAYS Performed at [facility], [place] [address] [place], [place] [address]    Report Status PENDING  Incomplete  Gastrointestinal Panel by PCR , Stool     Status: Abnormal   Collection Time: 03/20/21  2:08 PM   Specimen: STOOL  Result Value Ref Range Status   Campylobacter species NOT DETECTED NOT DETECTED Final   Plesimonas shigelloides NOT DETECTED NOT DETECTED Final   Salmonella species NOT DETECTED NOT DETECTED Final   Yersinia enterocolitica NOT DETECTED NOT DETECTED Final   Vibrio species NOT DETECTED NOT DETECTED Final   Vibrio cholerae NOT DETECTED NOT DETECTED Final   Enteroaggregative E coli (EAEC) DETECTED (A) NOT DETECTED Final    Comment: RESULT CALLED TO, READ BACK BY AND VERIFIED WITH: REN AGUSTIN AT 1348 ON 03/21/2021 [place].    Enteropathogenic E coli (EPEC) NOT DETECTED NOT DETECTED Final   Enterotoxigenic E coli (ETEC) NOT DETECTED NOT DETECTED Final   Shiga like toxin producing E coli (STEC) NOT DETECTED NOT DETECTED Final   Shigella/Enteroinvasive E coli (EIEC) NOT DETECTED NOT DETECTED Final   Cryptosporidium NOT DETECTED NOT DETECTED Final   Cyclospora cayetanensis NOT DETECTED NOT DETECTED Final   Entamoeba histolytica NOT DETECTED NOT DETECTED Final   Giardia lamblia NOT DETECTED NOT DETECTED Final   Adenovirus F40/41 NOT DETECTED NOT DETECTED Final   Astrovirus NOT DETECTED NOT DETECTED  Final   Norovirus GI/GII NOT DETECTED NOT DETECTED Final   Rotavirus A NOT DETECTED NOT DETECTED Final   Sapovirus (I, II, IV, and V) NOT DETECTED NOT DETECTED Final    Comment: Performed at [facility], [address] [place], [place] [address]  C Difficile Quick Screen w PCR reflex     Status: None   Collection Time: 03/20/21  2:14 PM   Specimen: STOOL  Result Value Ref Range Status   C Diff antigen NEGATIVE NEGATIVE Final   C Diff toxin NEGATIVE NEGATIVE Final   C Diff interpretation No C. difficile detected.  Final    Comment: Performed at [facility], [place] [address], [place], [place] [address]  SARS CORONAVIRUS 2 (TAT 6-24 HRS) Nasopharyngeal Nasopharyngeal Swab     Status: None   Collection Time: 03/22/21  2:35 PM   Specimen: Nasopharyngeal Swab  Result Value Ref Range Status   SARS Coronavirus 2 NEGATIVE NEGATIVE Final    Comment: (NOTE) SARS-CoV-2 target nucleic acids are NOT DETECTED.  The SARS-CoV-2 RNA is generally detectable in upper and lower respiratory specimens during the acute phase of infection. Negative results do not preclude SARS-CoV-2 infection, do not rule out co-infections with other pathogens, and should not be used as the sole basis for treatment or other patient management decisions. Negative results must be combined with clinical observations, patient history, and epidemiological information. The expected result is Negative.  Fact Sheet for Patients: SugarRoll.be  Fact Sheet for Healthcare Providers: https://www.woods-mathews.com/  This test is not yet approved or cleared by the [place] FDA and  has been authorized for detection and/or diagnosis of SARS-CoV-2 by FDA under an Emergency Use Authorization (EUA). This EUA will remain  in effect (meaning this test can be used) for the duration of the COVID-19 declaration under Se ction 564(b)(1) of the Act, 21 U.S.C. section  360bbb-3(b)(1), unless the authorization is terminated or revoked sooner.  Performed at [facility], [place] [address], [place], [place] [address]      Time coordinating discharge: Over 30 minutes  SIGNED:   Darliss Cheney, MD  [facility] 03/23/2021, 9:52 AM  If 7PM-7AM, please contact night-coverage [email or web address]

## 2021-03-23 NOTE — Progress Notes (Signed)
DISCHARGE NOTE SNF [facility] to be discharged Skilled nursing facility [facility] per MD order. Patient verbalized understanding.  Skin clean, dry and intact without evidence of skin break down, no evidence of skin tears noted. IV catheter discontinued intact. Site without signs and symptoms of complications. Dressing and pressure applied. Pt denies pain at the site currently. No complaints noted.  Patient free of lines, drains, and wounds.   Discharge packet assembled. An After Visit Summary (AVS) was printed and given to the EMS personnel. Patient escorted via stretcher and discharged to [facility] via ambulance. Report called to accepting facility; To Monique all questions and concerns addressed.   Berneta Levins, RN

## 2021-03-23 NOTE — Progress Notes (Signed)
Gave  Report to [facility]

## 2021-03-23 NOTE — Progress Notes (Signed)
Called [facility] to give report Left Message with Inez Catalina who said nurse would be giving medications now.

## 2021-03-23 NOTE — TOC Transition Note (Signed)
Transition of Care [facility]) - CM/SW Discharge Note *Discharged to [facility] Phone number to call report: [phone]   Patient Details  Name: Kendra Charles MRN: [id number] Date of Birth: [birth year]  Transition of Care [facility]) CM/SW Contact:  Sable Feil, LCSW Phone Number: 03/23/2021, 12:27 PM   Clinical Narrative:  Patient medically stable for discharge and going to [facility]. Discharge clinicals transmitted to facility, nurse provided information to call report and son informed regarding today's discharge and ambulance transport.      Final next level of care: [facility]) Barriers to Discharge: Barriers Resolved   Patient Goals and CMS Choice Patient states their goals for this hospitalization and ongoing recovery are:: Son agreeable to [place] rehab CMS Medicare.gov Compare Post Acute Care list provided to:: Patient Represenative (must comment) (Talked with son regarding [email or web address]) Choice offered to / list presented to : Adult Children  Discharge Placement   Existing PASRR number confirmed : 03/22/21          Patient chooses bed at: [facility] Patient to be transferred to facility by: [facility] Name of family member notified: Doreene Burke - [phone] Patient and family notified of of transfer: 03/23/21  Discharge Plan and Services In-house Referral: Clinical Social Work   Post Acute Care Choice: [facility]                               Social Determinants of Health (SDOH) Interventions  No SDOH interventions requested or needed at discharge.   Readmission Risk Interventions Readmission Risk Prevention Plan 06/19/2019  Transportation Screening Complete  PCP or Specialist Appt within 5-7 Days Complete  Home Care Screening Complete  Medication Review (RN CM) Complete

## 2021-03-24 LAB — CULTURE, BLOOD (ROUTINE X 2)
Culture: NO GROWTH
Culture: NO GROWTH
Special Requests: ADEQUATE

## 2022-01-08 ENCOUNTER — Emergency Department (HOSPITAL_COMMUNITY)
Admission: EM | Admit: 2022-01-08 | Discharge: 2022-01-09 | Disposition: A | Discharge status: Skilled Nursing Facility | Payer: Medicare Other | Attending: Emergency Medicine | Admitting: Emergency Medicine

## 2022-01-08 ENCOUNTER — Emergency Department (HOSPITAL_COMMUNITY): Payer: Medicare Other

## 2022-01-08 ENCOUNTER — Other Ambulatory Visit: Payer: Self-pay

## 2022-01-08 ENCOUNTER — Encounter (HOSPITAL_COMMUNITY): Payer: Self-pay | Admitting: Emergency Medicine

## 2022-01-08 DIAGNOSIS — Z7901 Long term (current) use of anticoagulants: Secondary | ICD-10-CM | POA: Diagnosis not present

## 2022-01-08 DIAGNOSIS — E871 Hypo-osmolality and hyponatremia: Secondary | ICD-10-CM | POA: Insufficient documentation

## 2022-01-08 DIAGNOSIS — W1839XA Other fall on same level, initial encounter: Secondary | ICD-10-CM | POA: Diagnosis not present

## 2022-01-08 DIAGNOSIS — F0390 Unspecified dementia without behavioral disturbance: Secondary | ICD-10-CM | POA: Diagnosis not present

## 2022-01-08 DIAGNOSIS — M25561 Pain in right knee: Secondary | ICD-10-CM | POA: Insufficient documentation

## 2022-01-08 DIAGNOSIS — G8929 Other chronic pain: Secondary | ICD-10-CM | POA: Diagnosis not present

## 2022-01-08 LAB — CBC WITH DIFFERENTIAL/PLATELET
Abs Immature Granulocytes: 0.02 10*3/uL (ref 0.00–0.07)
Basophils Absolute: 0.1 10*3/uL (ref 0.0–0.1)
Basophils Relative: 1 %
Eosinophils Absolute: 0.1 10*3/uL (ref 0.0–0.5)
Eosinophils Relative: 1 %
HCT: 39.7 % (ref 36.0–46.0)
Hemoglobin: 13.6 g/dL (ref 12.0–15.0)
Immature Granulocytes: 0 %
Lymphocytes Relative: 27 %
Lymphs Abs: 2.2 10*3/uL (ref 0.7–4.0)
MCH: 30.6 pg (ref 26.0–34.0)
MCHC: 34.3 g/dL (ref 30.0–36.0)
MCV: 89.2 fL (ref 80.0–100.0)
Monocytes Absolute: 0.7 10*3/uL (ref 0.1–1.0)
Monocytes Relative: 9 %
Neutro Abs: 5.3 10*3/uL (ref 1.7–7.7)
Neutrophils Relative %: 62 %
Platelets: 293 10*3/uL (ref 150–400)
RBC: 4.45 MIL/uL (ref 3.87–5.11)
RDW: 13 % (ref 11.5–15.5)
WBC: 8.4 10*3/uL (ref 4.0–10.5)
nRBC: 0 % (ref 0.0–0.2)

## 2022-01-08 LAB — BASIC METABOLIC PANEL
Anion gap: 12 (ref 5–15)
BUN: 8 mg/dL (ref 8–23)
CO2: 25 mmol/L (ref 22–32)
Calcium: 9.7 mg/dL (ref 8.9–10.3)
Chloride: 89 mmol/L — ABNORMAL LOW (ref 98–111)
Creatinine, Ser: 0.59 mg/dL (ref 0.44–1.00)
GFR, Estimated: >60 mL/min (ref >60)
Glucose, Bld: 103 mg/dL — ABNORMAL HIGH (ref 70–99)
Potassium: 3.5 mmol/L (ref 3.5–5.1)
Sodium: 126 mmol/L — ABNORMAL LOW (ref 135–145)

## 2022-01-08 LAB — URINALYSIS, ROUTINE W REFLEX MICROSCOPIC
Bacteria, UA: NONE SEEN
Bilirubin Urine: NEGATIVE
Glucose, UA: 150 mg/dL — AB
Hgb urine dipstick: NEGATIVE
Ketones, ur: NEGATIVE mg/dL
Nitrite: NEGATIVE
Protein, ur: NEGATIVE mg/dL
Specific Gravity, Urine: 1.006 (ref 1.005–1.030)
pH: 7 (ref 5.0–8.0)

## 2022-01-08 MED ORDER — SODIUM CHLORIDE 0.9 % IV BOLUS
500.0000 mL | Freq: Once | INTRAVENOUS | Status: AC
  Administered 2022-01-08: 500 mL via INTRAVENOUS

## 2022-01-08 NOTE — ED Triage Notes (Signed)
Pt BIB [facility] from [facility]. Pt takes eliqius and went to the desk at the facility today saying she had an unwitnessed fall, denies hitting her head, denies LOC. Pt endorses R knee pain which is chronic. Hx dementia. Staff reports she has been more "foggy" than her normal for the last two days but reports that today she is at her baseline. A/o x2 on assessment. Pt informed this RN that she did not fall, but instead lowered herself to the ground.  ? ?EMS VS 136/70, HR 54, SpO2 96% RA, CBG 121 ?

## 2022-01-08 NOTE — ED Notes (Signed)
RN informed pt of need for urine sample, pt stated "well i've been peeing all day". Pt found to have urinated in the bed, covering pts clothing. Pt taken to restroom by this RN and changed into hospital provided brief, gown, and paper scrub pants. Pts skin cleaned. Pt resting comfortably in bed at this time.  ?

## 2022-01-08 NOTE — ED Provider Notes (Signed)
?Welcome ?Provider Note ? ? ?CSN: [id number] ?Arrival date & time: 01/08/22  1045 ? ?  ? ?History ? ?Chief Complaint  ?Patient presents with  ? Knee Pain  ? ?LEVEL 5 CAVEAT: DEMENTIA ? ?Kendra Charles is a 81 y.o. female who presents to the emergency department brought in by EMS from [facility] with concerns of right knee pain today.  Patient takes Eliquis.  Patient notes that her right knee gave out which caused her to lower herself to the ground.  She did not hit her head or pass out. Patient denies chest pain, shortness of breath, trouble breathing, hitting her head, LOC.  She notes that her right knee gave out which caused her to lower herself to the ground. ? ? ?However, further conversation with [facility] facility staff and they note that the patient has been confused for the past 3 days as well as not able to ambulate as well due to right knee pain for 3 days.  They note that the patient was not evaluated or sent out to the emergency department for evaluation of her symptoms until today.   ? ? ?The history is provided by the patient and a caregiver. No language interpreter was used.  ? ?  ? ?Home Medications ?Prior to Admission medications   ?Medication Sig Start Date End Date Taking? Authorizing Provider  ?acetaminophen (TYLENOL) 325 MG tablet Take 650 mg by mouth 2 (two) times daily.   Yes [provider]  ?amLODipine (NORVASC) 5 MG tablet Take 5 mg by mouth daily.   Yes [provider]  ?apixaban (ELIQUIS) 5 MG TABS tablet Take 1 tablet (5 mg total) by mouth 2 (two) times daily. 06/23/19  Yes Earlene Plater, MD  ?donepezil (ARICEPT) 5 MG tablet Take 5 mg by mouth at bedtime.   Yes [provider]  ?hydrochlorothiazide (HYDRODIURIL) 25 MG tablet Take 25 mg by mouth daily.   Yes [provider]  ?levETIRAcetam (KEPPRA) 500 MG tablet Take 1 tablet (500 mg total) by mouth 2 (two) times daily. 06/25/19  Yes  Earlene Plater, MD  ?lisinopril (ZESTRIL) 40 MG tablet Take 40 mg by mouth daily.   Yes [provider]  ?loratadine (CLARITIN) 10 MG tablet Take 10 mg by mouth daily.   Yes [provider]  ?lovastatin (MEVACOR) 40 MG tablet Take 40 mg by mouth at bedtime.  06/17/19  Yes [provider]  ?Melatonin-Theanine 10-5.5 MG TABS Take 1 tablet by mouth at bedtime.   Yes [provider]  ?metoprolol tartrate (LOPRESSOR) 50 MG tablet Take 50 mg by mouth 2 (two) times daily.   Yes [provider]  ?Multiple Vitamin (MULTIVITAMIN) tablet Take 1 tablet by mouth daily.   Yes [provider]  ?traZODone (DESYREL) 50 MG tablet Take 50 mg by mouth at bedtime.   Yes [provider]  ?   ? ?Allergies    ?Patient has no known allergies.   ? ?Review of Systems   ?Review of Systems  ?Unable to perform ROS: Dementia  ? ?Physical Exam ?Updated Vital Signs ?BP (!) 141/106 (BP Location: Right Arm)   Pulse 75   Temp (!) 97.5 ?F (36.4 ?C) (Oral)   Resp 14   SpO2 94%  ?Physical Exam ?Vitals and nursing note reviewed.  ?Constitutional:   ?   General: She is not in acute distress. ?   Appearance: She is not diaphoretic.  ?HENT:  ?   Head:  Normocephalic and atraumatic.  ?   Mouth/Throat:  ?   Pharynx: No oropharyngeal exudate.  ?Eyes:  ?   General: No scleral icterus. ?   Conjunctiva/sclera: Conjunctivae normal.  ?Cardiovascular:  ?   Rate and Rhythm: Normal rate and regular rhythm.  ?   Pulses: Normal pulses.  ?   Heart sounds: Normal heart sounds.  ?Pulmonary:  ?   Effort: Pulmonary effort is normal. No respiratory distress.  ?   Breath sounds: Normal breath sounds. No wheezing.  ?Abdominal:  ?   General: Bowel sounds are normal.  ?   Palpations: Abdomen is soft. There is no mass.  ?   Tenderness: There is no abdominal tenderness. There is no guarding or rebound.  ?Musculoskeletal:     ?   General: Normal range of motion.  ?   Cervical back: Normal range of motion and neck  supple.  ?   Comments: No tenderness to palpation noted to the C, T, L, S spine.  No tenderness to palpation noted to musculature of back.  No tenderness to palpation noted to right knee.  No obvious swelling or deformity.  No overlying skin changes. Pedal pulses intact.  Strength and sensation intact to bilateral upper and lower extremities.  ?Skin: ?   General: Skin is warm and dry.  ?Neurological:  ?   Mental Status: She is alert.  ?   Sensory: Sensation is intact.  ?   Motor: No pronator drift.  ?Psychiatric:     ?   Behavior: Behavior normal.  ? ? ?ED Results / Procedures / Treatments   ?Labs ?(all labs ordered are listed, but only abnormal results are displayed) ?Labs Reviewed  ?BASIC METABOLIC PANEL - Abnormal; Notable for the following components:  ?    Result Value  ? Sodium 126 (*)   ? Chloride 89 (*)   ? Glucose, Bld 103 (*)   ? All other components within normal limits  ?URINALYSIS, ROUTINE W REFLEX MICROSCOPIC - Abnormal; Notable for the following components:  ? Color, Urine STRAW (*)   ? Glucose, UA 150 (*)   ? Leukocytes,Ua SMALL (*)   ? All other components within normal limits  ?CBC WITH DIFFERENTIAL/PLATELET  ? ? ?EKG ?None ? ?Radiology ?DG Knee 2 Views Right ? ?Result Date: 01/08/2022 ?CLINICAL DATA:  RIGHT knee pain, unwitnessed fall EXAM: RIGHT KNEE - 1-2 VIEW COMPARISON:  None FINDINGS: Osseous demineralization. Tricompartmental joint space narrowing and spur formation. Chondrocalcinosis question CPPD. No acute fracture, dislocation, or bone destruction. No joint effusion. Extensive atherosclerotic calcifications. 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 No acute osseous abnormalities. Extensive atherosclerotic calcifications. Electronically Signed   By: Lavonia Dana M.D.   On: 01/08/2022 13:11  ? ?CT Head Wo Contrast ? ?Result Date: 01/08/2022 ?CLINICAL DATA:  Unwitnessed fall, hit head, on Eliquis EXAM: CT HEAD WITHOUT  CONTRAST CT CERVICAL SPINE WITHOUT CONTRAST TECHNIQUE: Multidetector CT imaging of the head and cervical spine was performed following the standard protocol without intravenous contrast. Multiplanar CT image reconstructions of the cervical spine were also generated. RADIATION DOSE REDUCTION: This exam was performed according to the departmental dose-optimization program which includes automated exposure control, adjustment of the mA and/or kV according to patient size and/or use of iterative reconstruction technique. COMPARISON:  03/18/2021 FINDINGS: CT HEAD FINDINGS Brain: No evidence of acute infarction, hemorrhage, hydrocephalus, extra-axial collection or mass lesion/mass effect. Unchanged, extensive periventricular and deep white matter hypodensity, with encephalomalacia of the right  parietal and right temporal lobes (series 3, image 15). 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 Vascular: No hyperdense vessel or unexpected calcification. Skull: Normal. Negative for fracture or focal lesion. Sinuses/Orbits: No acute finding. Other: None. CT CERVICAL SPINE FINDINGS Alignment: Normal. Skull base and vertebrae: No acute fracture. No primary bone lesion or focal pathologic process. Soft tissues and spinal canal: No prevertebral fluid or swelling. No visible canal hematoma. Disc levels: Moderate multilevel disc space height loss and osteophytosis, worst at C5-C6. Upper chest: Negative. Other: None. IMPRESSION: 1. No acute intracranial pathology. 2. 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 3. No fracture or static subluxation of the cervical spine. Moderate multilevel cervical disc degenerative disease, worst at C5-C6. Electronically Signed   By: Delanna Ahmadi M.D.   On: 01/08/2022 13:08  ? ?CT  Cervical Spine Wo Contrast ? ?Result Date: 01/08/2022 ?CLINICAL DATA:  Unwitnessed fall, hit head, on Eliquis EXAM: CT HEAD WITHOUT CONTRAST CT CERVICAL SPINE WITHOUT CONTRAST TECHNIQUE: Multidetector CT imaging of the he

## 2022-01-08 NOTE — ED Provider Notes (Signed)
?  Physical Exam  ?BP (!) 153/91   Pulse 80   Temp (!) 97.5 ?F (36.4 ?C) (Oral)   Resp 15   SpO2 97%  ? ?Physical Exam ?Vitals and nursing note reviewed.  ?Constitutional:   ?   Appearance: Normal appearance.  ?HENT:  ?   Head: Normocephalic and atraumatic.  ?   Nose: Nose normal.  ?Cardiovascular:  ?   Rate and Rhythm: Normal rate.  ?Pulmonary:  ?   Effort: Pulmonary effort is normal.  ?Abdominal:  ?   General: Abdomen is flat.  ?Musculoskeletal:  ?   Cervical back: Normal range of motion and neck supple.  ?Skin: ?   General: Skin is warm and dry.  ?Neurological:  ?   Mental Status: She is alert and oriented to person, place, and time.  ? ? ?Procedures  ?Procedures ? ?ED Course / MDM  ?  ?Medical Decision Making ?Amount and/or Complexity of Data Reviewed ?Labs: ordered. ?Radiology: ordered. ? ?Patient care assumed from provider Sojiet B. PA at shift change, please see her note for a full HPI. Briefly, patient here from [facility] for change in mental status, endorsing right knee pain.  Per image being obtained shows some old lacunar infarcts that are not acute per radiology.  Cervical spine is within normal limits.  X-ray of her knee does not show any acute findings.  Patient was ambulated while in the ED.  Her labs are at baseline.  Plan is for patient to ambulate, resulting UA, likely discharge back to facility via [facility].  ? ?UA with small amount of leukocytes, no bacteria noted, no white blood cell count.  Have a lower suspicion for UTI at this time.  Patient was hydrated with a 500 bolus, her vitals are within normal limits she is stable for disposition back to facility. ? ? ? ?Portions of this note were generated with [employer]. Dictation errors may occur despite best attempts at proofreading.   ? ? ? ? ?  ?Janeece Fitting, PA-C ?01/08/22 1730 ? ?  ?Davonna Belling, MD ?01/08/22 1919 ? ?

## 2022-01-08 NOTE — ED Notes (Signed)
Assisted patient to the bathroom with walker patient did well patient is now back in bed with bell rails up ?

## 2022-01-08 NOTE — Discharge Instructions (Addendum)
Your laboratory results are within normal limits today. ? ?The x-ray of your right knee did not show any acute findings. ? ?Follow-up with your primary care physician as needed. ?

## 2022-01-19 ENCOUNTER — Encounter (HOSPITAL_COMMUNITY): Payer: Self-pay | Admitting: Emergency Medicine

## 2022-01-19 ENCOUNTER — Emergency Department (HOSPITAL_COMMUNITY)
Admission: EM | Admit: 2022-01-19 | Discharge: 2022-01-19 | Disposition: A | Discharge status: Home / Self Care | Payer: Medicare Other | Attending: Emergency Medicine | Admitting: Emergency Medicine

## 2022-01-19 ENCOUNTER — Other Ambulatory Visit: Payer: Self-pay

## 2022-01-19 DIAGNOSIS — R7309 Other abnormal glucose: Secondary | ICD-10-CM | POA: Diagnosis not present

## 2022-01-19 DIAGNOSIS — R55 Syncope and collapse: Secondary | ICD-10-CM

## 2022-01-19 DIAGNOSIS — N814 Uterovaginal prolapse, unspecified: Secondary | ICD-10-CM

## 2022-01-19 DIAGNOSIS — Z7901 Long term (current) use of anticoagulants: Secondary | ICD-10-CM | POA: Diagnosis not present

## 2022-01-19 DIAGNOSIS — E871 Hypo-osmolality and hyponatremia: Secondary | ICD-10-CM | POA: Diagnosis not present

## 2022-01-19 DIAGNOSIS — E860 Dehydration: Secondary | ICD-10-CM

## 2022-01-19 LAB — CBC WITH DIFFERENTIAL/PLATELET
Abs Immature Granulocytes: 0.07 10*3/uL (ref 0.00–0.07)
Basophils Absolute: 0.1 10*3/uL (ref 0.0–0.1)
Basophils Relative: 0 %
Eosinophils Absolute: 0.1 10*3/uL (ref 0.0–0.5)
Eosinophils Relative: 1 %
HCT: 38.7 % (ref 36.0–46.0)
Hemoglobin: 13.4 g/dL (ref 12.0–15.0)
Immature Granulocytes: 1 %
Lymphocytes Relative: 16 %
Lymphs Abs: 2.2 10*3/uL (ref 0.7–4.0)
MCH: 31 pg (ref 26.0–34.0)
MCHC: 34.6 g/dL (ref 30.0–36.0)
MCV: 89.6 fL (ref 80.0–100.0)
Monocytes Absolute: 0.9 10*3/uL (ref 0.1–1.0)
Monocytes Relative: 7 %
Neutro Abs: 10.5 10*3/uL — ABNORMAL HIGH (ref 1.7–7.7)
Neutrophils Relative %: 75 %
Platelets: 304 10*3/uL (ref 150–400)
RBC: 4.32 MIL/uL (ref 3.87–5.11)
RDW: 12.5 % (ref 11.5–15.5)
WBC: 13.8 10*3/uL — ABNORMAL HIGH (ref 4.0–10.5)
nRBC: 0 % (ref 0.0–0.2)

## 2022-01-19 LAB — BASIC METABOLIC PANEL
Anion gap: 15 (ref 5–15)
BUN: 7 mg/dL — ABNORMAL LOW (ref 8–23)
CO2: 22 mmol/L (ref 22–32)
Calcium: 9.5 mg/dL (ref 8.9–10.3)
Chloride: 86 mmol/L — ABNORMAL LOW (ref 98–111)
Creatinine, Ser: 0.81 mg/dL (ref 0.44–1.00)
GFR, Estimated: >60 mL/min (ref >60)
Glucose, Bld: 129 mg/dL — ABNORMAL HIGH (ref 70–99)
Potassium: 3.4 mmol/L — ABNORMAL LOW (ref 3.5–5.1)
Sodium: 123 mmol/L — ABNORMAL LOW (ref 135–145)

## 2022-01-19 LAB — CBG MONITORING, ED: Glucose-Capillary: 145 mg/dL — ABNORMAL HIGH (ref 70–99)

## 2022-01-19 MED ORDER — SODIUM CHLORIDE 0.9 % IV BOLUS
1000.0000 mL | Freq: Once | INTRAVENOUS | Status: AC
  Administered 2022-01-19: 1000 mL via INTRAVENOUS

## 2022-01-19 NOTE — ED Provider Notes (Addendum)
?[facility] ?Provider Note ? ? ?CSN: [id number] ?Arrival date & time: 01/19/22  1007 ? ?  ? ?History ? ?Chief Complaint  ?Patient presents with  ? Loss of Consciousness  ? ? ?Kendra Charles is a 81 y.o. female. ? ?81 yo F with a cc of syncope.  This occurred just prior to arrival.  Patient was reportedly having a BM.  Hypotensive with EMS.  Resolved spontaneously.  Patient is demented and unable to provide any history.  Appears to be at baseline per EMS and per ED nursing staff who had her last week. ? ? ?Loss of Consciousness ? ?  ? ?Home Medications ?Prior to Admission medications   ?Medication Sig Start Date End Date Taking? Authorizing Provider  ?acetaminophen (TYLENOL) 325 MG tablet Take 650 mg by mouth 2 (two) times daily.   Yes [provider]  ?acetaminophen (TYLENOL) 500 MG tablet Take 500 mg by mouth every 6 (six) hours as needed for mild pain or headache (fever 99.5-101).   Yes [provider]  ?amLODipine (NORVASC) 5 MG tablet Take 5 mg by mouth daily.   Yes [provider]  ?apixaban (ELIQUIS) 5 MG TABS tablet Take 1 tablet (5 mg total) by mouth 2 (two) times daily. 06/23/19  Yes Earlene Plater, MD  ?diclofenac Sodium (VOLTAREN) 1 % GEL Apply 4 g topically 4 (four) times daily as needed (right knee pain).   Yes [provider]  ?donepezil (ARICEPT) 5 MG tablet Take 5 mg by mouth at bedtime.   Yes [provider]  ?hydrochlorothiazide (HYDRODIURIL) 25 MG tablet Take 25 mg by mouth daily.   Yes [provider]  ?levETIRAcetam (KEPPRA) 500 MG tablet Take 1 tablet (500 mg total) by mouth 2 (two) times daily. 06/25/19  Yes Earlene Plater, MD  ?lisinopril (ZESTRIL) 40 MG tablet Take 40 mg by mouth daily.   Yes [provider]  ?loratadine (CLARITIN) 10 MG tablet Take 10 mg by mouth daily.   Yes [provider]  ?lovastatin (MEVACOR) 40 MG tablet Take 40 mg by mouth at bedtime.  06/17/19  Yes [provider]  ?Melatonin-Theanine 10-5.5 MG TABS Take 1 tablet by mouth at bedtime.   Yes [provider]  ?metoprolol tartrate (LOPRESSOR) 50 MG tablet Take 50 mg by mouth 2 (two) times daily.   Yes [provider]  ?Multiple Vitamin (MULTIVITAMIN) tablet Take 1 tablet by mouth daily.   Yes [provider]  ?traZODone (DESYREL) 50 MG tablet Take 50 mg by mouth at bedtime.   Yes [provider]  ?   ? ?Allergies    ?Patient has no known allergies.   ? ?Review of Systems   ?Review of Systems  ?Cardiovascular:  Positive for syncope.  ? ?Physical Exam ?Updated Vital Signs ?BP (!) 119/96   Pulse 66   Temp (!) 96.8 ?F (36 ?C) (Axillary)   Resp 14   SpO2 94%  ?Physical Exam ?Vitals and nursing note reviewed.  ?Constitutional:   ?   General: She is not in acute distress. ?   Appearance: She is well-developed. She is not diaphoretic.  ?HENT:  ?   Head: Normocephalic and atraumatic.  ?Eyes:  ?   Pupils: Pupils are equal, round, and reactive to light.  ?Cardiovascular:  ?   Rate and Rhythm: Normal rate and regular rhythm.  ?   Heart sounds: No murmur heard. ?  No friction rub. No gallop.  ?Pulmonary:  ?  Effort: Pulmonary effort is normal.  ?   Breath sounds: No wheezing or rales.  ?Abdominal:  ?   General: There is no distension.  ?   Palpations: Abdomen is soft.  ?   Tenderness: There is no abdominal tenderness.  ?Genitourinary: ?   Comments: Significant uterine prolapse. ?Musculoskeletal:     ?   General: No tenderness.  ?   Cervical back: Normal range of motion and neck supple.  ?Skin: ?   General: Skin is warm and dry.  ?Neurological:  ?   Mental Status: She is alert and oriented to person, place, and time.  ?Psychiatric:     ?   Behavior: Behavior normal.  ? ? ?ED Results / Procedures / Treatments   ?Labs ?(all labs ordered are listed, but only abnormal results are displayed) ?Labs Reviewed  ?CBC WITH DIFFERENTIAL/PLATELET - Abnormal; Notable for the following components:  ?     Result Value  ? WBC 13.8 (*)   ? Neutro Abs 10.5 (*)   ? All other components within normal limits  ?BASIC METABOLIC PANEL - Abnormal; Notable for the following components:  ? Sodium 123 (*)   ? Potassium 3.4 (*)   ? Chloride 86 (*)   ? Glucose, Bld 129 (*)   ? BUN 7 (*)   ? All other components within normal limits  ?CBG MONITORING, ED - Abnormal; Notable for the following components:  ? Glucose-Capillary 145 (*)   ? All other components within normal limits  ? ? ?EKG ?EKG Interpretation ? ?Date/Time:  Friday January 19 2022 10:11:21 EDT ?Ventricular Rate:  52 ?PR Interval:    ?QRS Duration: 111 ?QT Interval:  509 ?QTC Calculation: 474 ?R Axis:   44 ?Text Interpretation: Atrial fibrillation Borderline T wave abnormalities No significant change since last tracing Confirmed by Deno Etienne [phone]) on 01/19/2022 10:13:48 AM ? ?Radiology ?No results found. ? ?Procedures ?Procedures  ? ? ?Medications Ordered in ED ?Medications  ?sodium chloride 0.9 % bolus 1,000 mL (0 mLs Intravenous Stopped 01/19/22 1305)  ? ? ?ED Course/ Medical Decision Making/ A&P ?  ?                        ?Medical Decision Making ?Amount and/or Complexity of Data Reviewed ?Labs: ordered. ? ? ?81 yo F with a chief complaint of a syncopal event.  Sounds vasovagal by history.  Patient is demented and unable to provide any history.  Reportedly was on the toilets and had an event.  Initial low blood pressures but improved spontaneously and is back at baseline.  Will obtain basic blood work.  EKG without obvious concerning finding.  Blood sugar. ? ?Of note the patient had a fairly large bowel movement on arrival.  Was noted to have significant uterine prolapse.  Unlikely to be an acute finding.  No obvious way to obtain a pessary through the ED after discussion with GYN providers.  Will have the nursing home inquire. ? ?Sodium is low.  No significant anemia.  Give a bolus of IV fluids likely due to dehydration.  We will have this rechecked in the  office. ? ?1:11 PM:  I have discussed the diagnosis/risks/treatment options with the patient.  Evaluation and diagnostic testing in the emergency department does not suggest an emergent condition requiring admission or immediate intervention beyond what has been performed at this time.  They will follow up with  PCP. We also discussed returning to the ED immediately if  new or worsening sx occur. We discussed the sx which are most concerning (e.g., sudden worsening pain, fever, inability to tolerate by mouth) that necessitate immediate return. Medications administered to the patient during their visit and any new prescriptions provided to the patient are listed below. ? ?Medications given during this visit ?Medications  ?sodium chloride 0.9 % bolus 1,000 mL (0 mLs Intravenous Stopped 01/19/22 1305)  ? ? ? ?The patient appears reasonably screen and/or stabilized for discharge and I doubt any other medical condition or other [facility] requiring further screening, evaluation, or treatment in the ED at this time prior to discharge.  ? ? ? ? ? ? ? ? ?Final Clinical Impression(s) / ED Diagnoses ?Final diagnoses:  ?Syncope and collapse  ?Uterine prolapse  ?Hyponatremia  ?Dehydration  ? ? ?Rx / DC Orders ?ED Discharge Orders   ? ? None  ? ?  ? ? ?  ?Deno Etienne, DO ?01/19/22 1311 ? ?  ?Deno Etienne, DO ?01/19/22 1311 ? ?

## 2022-01-19 NOTE — Discharge Instructions (Addendum)
You have uterine prolapse.  There are different ways to address this.  Usually the OBGYN providers handle this.  The doctor at your facility may be able to help you with this. ? ?You passed out today.  Your blood work showed that you could be dehydrated, your sodium and chloride were low and so I gave you a bag of IV fluids.  Please have this rechecked in your doctor's office in a week.. Eat and drink as well as you can for the next couple of days.  ?

## 2022-01-19 NOTE — ED Notes (Signed)
This RN, Reba, RN, and Polly NT were changing pts brief as pt had soiled her brief. On assessment, pt found to have prolapsed uterus. EDP made aware, at bedside to assess pt.  ?

## 2022-01-19 NOTE — ED Triage Notes (Signed)
Pt BIB [facility] from [place]. Pt had a syncopal episode on the toilet, witnessed by staff. Initial BP 80s, most recent was 124/86. HR 54, SpO2 99% RA, CBG 120 ?

## 2022-02-14 ENCOUNTER — Ambulatory Visit (INDEPENDENT_AMBULATORY_CARE_PROVIDER_SITE_OTHER): Payer: Medicare Other | Admitting: Obstetrics and Gynecology

## 2022-02-14 ENCOUNTER — Encounter: Payer: Self-pay | Admitting: Obstetrics and Gynecology

## 2022-02-14 VITALS — BP 133/99 | HR 70 | Ht 63.0 in | Wt 126.0 lb

## 2022-02-14 DIAGNOSIS — N814 Uterovaginal prolapse, unspecified: Secondary | ICD-10-CM

## 2022-02-14 NOTE — Progress Notes (Signed)
? ?  GYNECOLOGY OFFICE VISIT NOTE ? ?History:  ? Kendra Charles is a 81 y.o. G4P0 here today for probable prolapse. She denies vaginal bleeding. She states only recently she has been bothered by the pressure. She has urinary frequency. She went to the emergency room for it on 4/4 and they recommend f/u with gynecology. .  ? ?She denies any abnormal vaginal discharge, bleeding, pelvic pain or other concerns. ? ? ?  ?Past Medical History:  ?Diagnosis Date  ? Hypercholesteremia   ? Hypertension   ? PAD (peripheral artery disease) ([place])   ? ? ?History reviewed. No pertinent surgical history. ? ?The following portions of the patient's history were reviewed and updated as appropriate: allergies, current medications, past family history, past medical history, past social history, past surgical history and problem list. ? ?Review of Systems:  ?Pertinent items noted in HPI and remainder of comprehensive ROS otherwise negative. ? ?Physical Exam:  ?BP (!) 133/99   Pulse 70   Ht '5\' 3"'$  (1.6 m)   Wt 126 lb (57.2 kg)   BMI 22.32 kg/m?  ?CONSTITUTIONAL: Well-developed, well-nourished female in no acute distress.  ?HEENT:  Normocephalic, atraumatic. External right and left ear normal. No scleral icterus.  ?NECK: Normal range of motion, supple, no masses noted on observation ?SKIN: No rash noted. Not diaphoretic. No erythema. No pallor. ?MUSCULOSKELETAL: Normal range of motion. No edema noted. ?NEUROLOGIC: Alert and oriented to person, place, and time. Normal muscle tone coordination. No cranial nerve deficit noted. ?PSYCHIATRIC: Normal mood and affect. Normal behavior. Limited hearing ? ?CARDIOVASCULAR: Normal heart rate noted ?RESPIRATORY: Effort and breath sounds normal, no problems with respiration noted ?ABDOMEN: No masses noted. No other overt distention noted.   ? ?PELVIC: Normal appearing external genitalia; normal urethral meatus; normal appearing vaginal mucosa and cervix but clearly has had prolapse for some time.  No  abnormal discharge noted.  Normal uterine size, no other palpable masses, no uterine or adnexal tenderness. Has complete prolapse.  Performed in the presence of a chaperone ? ?Labs and Imaging ?No results found for this or any previous visit (from the past 168 hour(s)). ?No results found.  ?Assessment and Plan:  ? 1. Uterine prolapse ?- Discussed finding of prolapse ?- Discussed options for intervention: Expectant management, PFPT, pessary and surgery. Her daughter does not think she and the nursing home will be able to maintain the pessary.  ?- Surgical options include native tissue repair vs mesh based surgery. Examples are  Colpectomy and colpocleisis.  ?- Recommended referral to Urogynecology ?- She would like Referral to specialist ?- Ambulatory referral to Urogynecology ? ? ?Routine preventative health maintenance measures emphasized. ?Please refer to After Visit Summary for other counseling recommendations.  ? ?Return if symptoms worsen or fail to improve. ? ?Radene Gunning, MD, FACOG ?Obstetrician Social research officer, government, Faculty Practice ?[facility] ? ? ? ? ? ?

## 2022-03-22 ENCOUNTER — Emergency Department (HOSPITAL_COMMUNITY): Payer: Medicare Other

## 2022-03-22 ENCOUNTER — Emergency Department (HOSPITAL_COMMUNITY)
Admission: EM | Admit: 2022-03-22 | Discharge: 2022-03-23 | Disposition: A | Discharge status: Home / Self Care | Payer: Medicare Other | Attending: Emergency Medicine | Admitting: Emergency Medicine

## 2022-03-22 DIAGNOSIS — Z7901 Long term (current) use of anticoagulants: Secondary | ICD-10-CM | POA: Insufficient documentation

## 2022-03-22 DIAGNOSIS — W1830XA Fall on same level, unspecified, initial encounter: Secondary | ICD-10-CM | POA: Insufficient documentation

## 2022-03-22 DIAGNOSIS — Z79899 Other long term (current) drug therapy: Secondary | ICD-10-CM | POA: Diagnosis not present

## 2022-03-22 DIAGNOSIS — F0390 Unspecified dementia without behavioral disturbance: Secondary | ICD-10-CM | POA: Insufficient documentation

## 2022-03-22 DIAGNOSIS — M542 Cervicalgia: Secondary | ICD-10-CM | POA: Insufficient documentation

## 2022-03-22 DIAGNOSIS — I10 Essential (primary) hypertension: Secondary | ICD-10-CM | POA: Diagnosis not present

## 2022-03-22 DIAGNOSIS — W19XXXA Unspecified fall, initial encounter: Secondary | ICD-10-CM

## 2022-03-22 NOTE — Discharge Instructions (Addendum)
CT head and c-spine, chest x-ray and x-ray pelvis all negative for acute injury.

## 2022-03-22 NOTE — ED Notes (Signed)
Trauma Response Nurse Documentation   Kendra Charles is a 81 y.o. female arriving to [facility] ED via EMS  On Eliquis (apixaban) daily. Trauma was activated as a Level 2 based on the following trauma criteria Elderly patients > 65 with head trauma on anti-coagulation (excluding ASA). Trauma team at the bedside on patient arrival. Patient cleared for CT by Dr. Gilford Raid. Patient to CT with Primary RN.   History   Past Medical History:  Diagnosis Date   Hypercholesteremia    Hypertension    PAD (peripheral artery disease) ([place])      No past surgical history on file.   CT's Completed:   CT Head and CT C-Spine   Interventions:  IV, labs CXR/PXR CT Head/Cspine  Plan for disposition:  Discharge home   Care completed by ED RN [place] [facility].    Park Pope Tyishia Aune  Trauma Response RN  Please call TRN at [phone] for further assistance.

## 2022-03-22 NOTE — Progress Notes (Signed)
Orthopedic Tech Progress Note Patient Details:  Michie Molnar [birth year] [id number]  Level 2 trauma. Checked in with Dr. Gilford Raid and ortho tech services were not needed at this time.  Patient ID: Renesme Kerrigan, female   DOB: 07/02/1941, 81 y.o.   MRN: [id number]  Carin Primrose 03/22/2022, 5:53 PM

## 2022-03-22 NOTE — ED Provider Notes (Signed)
[facility] EMERGENCY DEPARTMENT Provider Note   CSN: [id number] Arrival date & time: 03/22/22  1507     History  Chief Complaint  Patient presents with   [name]    Kendra Charles is a 81 y.o. female.  81 year old female with past medical history of dementia, on Eliquis for A-fib brought in by EMS from facility for fall which was witnessed by patient's roommate.  Patient denies any injuries or concerns at this time.       Home Medications Prior to Admission medications   Medication Sig Start Date End Date Taking? Authorizing Provider  acetaminophen (TYLENOL) 325 MG tablet Take 650 mg by mouth 2 (two) times daily.    [provider]  acetaminophen (TYLENOL) 500 MG tablet Take 500 mg by mouth every 6 (six) hours as needed for mild pain or headache (fever 99.5-101).    [provider]  amLODipine (NORVASC) 5 MG tablet Take 5 mg by mouth daily.    [provider]  apixaban (ELIQUIS) 5 MG TABS tablet Take 1 tablet (5 mg total) by mouth 2 (two) times daily. 06/23/19   Earlene Plater, MD  diclofenac Sodium (VOLTAREN) 1 % GEL Apply 4 g topically 4 (four) times daily as needed (right knee pain).    [provider]  donepezil (ARICEPT) 5 MG tablet Take 5 mg by mouth at bedtime.    [provider]  hydrochlorothiazide (HYDRODIURIL) 25 MG tablet Take 25 mg by mouth daily.    [provider]  levETIRAcetam (KEPPRA) 500 MG tablet Take 1 tablet (500 mg total) by mouth 2 (two) times daily. 06/25/19   Earlene Plater, MD  lisinopril (ZESTRIL) 40 MG tablet Take 40 mg by mouth daily.    [provider]  loratadine (CLARITIN) 10 MG tablet Take 10 mg by mouth daily.    [provider]  lovastatin (MEVACOR) 40 MG tablet Take 40 mg by mouth at bedtime.  06/17/19   [provider]  Melatonin-Theanine 10-5.5 MG TABS Take 1 tablet by mouth at bedtime.    [provider]  metoprolol tartrate  (LOPRESSOR) 50 MG tablet Take 50 mg by mouth 2 (two) times daily.    [provider]  Multiple Vitamin (MULTIVITAMIN) tablet Take 1 tablet by mouth daily.    [provider]  traZODone (DESYREL) 50 MG tablet Take 50 mg by mouth at bedtime.    [provider]      Allergies    Patient has no known allergies.    Review of Systems   Review of Systems  Unable to perform ROS: Dementia    Physical Exam Updated Vital Signs BP (!) 142/90   Pulse 88   Temp 97.6 F (36.4 C) (Oral)   Resp (!) 23   Ht '5\' 3"'$  (1.6 m)   Wt 57.2 kg   SpO2 93%   BMI 22.34 kg/m  Physical Exam Vitals and nursing note reviewed.  Constitutional:      General: She is not in acute distress.    Appearance: She is well-developed. She is not diaphoretic.     Interventions: Cervical collar in place.  HENT:     Head: Normocephalic and atraumatic.     Nose: Nose normal.     Mouth/Throat:     Mouth: Mucous membranes are moist.  Eyes:     Extraocular Movements: Extraocular movements intact.     Pupils: Pupils are equal, round, and reactive to light.  Cardiovascular:  Rate and Rhythm: Normal rate. Rhythm irregular.     Pulses: Normal pulses.  Pulmonary:     Effort: Pulmonary effort is normal.  Abdominal:     Palpations: Abdomen is soft.     Tenderness: There is no abdominal tenderness.  Musculoskeletal:        General: No swelling, tenderness, deformity or signs of injury.     Cervical back: No deformity, tenderness or bony tenderness.     Thoracic back: No deformity, tenderness or bony tenderness.     Lumbar back: No deformity, tenderness or bony tenderness.     Right lower leg: No edema.     Left lower leg: No edema.     Comments: No pain with palpation range of motion all extremities  Skin:    General: Skin is warm and dry.     Findings: No erythema or rash.  Neurological:     General: No focal deficit present.     Mental Status: She is alert. Mental status is at baseline.      Sensory: No sensory deficit.     Motor: No weakness.  Psychiatric:        Behavior: Behavior normal.     ED Results / Procedures / Treatments   Labs (all labs ordered are listed, but only abnormal results are displayed) Labs Reviewed - No data to display  EKG EKG Interpretation  Date/Time:  Thursday March 22 2022 15:12:06 EDT Ventricular Rate:  87 PR Interval:    QRS Duration: 100 QT Interval:  412 QTC Calculation: 496 R Axis:   88 Text Interpretation: Atrial fibrillation Anteroseptal infarct, age indeterminate No significant change since last tracing Confirmed by Isla Pence [phone]) on 03/22/2022 3:46:53 PM  Radiology CT Cervical Spine Wo Contrast  Result Date: 03/22/2022 CLINICAL DATA:  Neck trauma (Age >= 65y) EXAM: CT CERVICAL SPINE WITHOUT CONTRAST TECHNIQUE: Multidetector CT imaging of the cervical spine was performed without intravenous contrast. Multiplanar CT image reconstructions were also generated. RADIATION DOSE REDUCTION: This exam was performed according to the departmental dose-optimization program which includes automated exposure control, adjustment of the mA and/or kV according to patient size and/or use of iterative reconstruction technique. COMPARISON:  January 08, 2022 FINDINGS: Alignment: Again seen is the hyper exaggeration of the cervical lordosis. Skull base and vertebrae: No acute fracture. No primary bone lesion or focal pathologic process. Moderate cervical spondylosis and is most prominent at the C5-C6 region. At C5-C6, there are prominent marginal osteophytes and some narrowing of the disc space seen. Soft tissues and spinal canal: No prevertebral fluid or swelling. No visible canal hematoma. Disc levels: Mild decrease in the height of the disc space at C5-C6. Upper chest: There are some emphysematous changes seen. Severe atheromatous calcifications of the thoracic aorta. Other: None. IMPRESSION: No acute fracture, subluxation or dislocation. Moderate  cervical spondylosis. Electronically Signed   By: Frazier Richards M.D.   On: 03/22/2022 16:33   CT Head Wo Contrast  Result Date: 03/22/2022 CLINICAL DATA:  Status post fall EXAM: CT HEAD WITHOUT CONTRAST TECHNIQUE: Contiguous axial images were obtained from the base of the skull through the vertex without intravenous contrast. RADIATION DOSE REDUCTION: This exam was performed according to the departmental dose-optimization program which includes automated exposure control, adjustment of the mA and/or kV according to patient size and/or use of iterative reconstruction technique. COMPARISON:  None Available. FINDINGS: Brain: No evidence of acute infarction, hemorrhage, extra-axial collection, ventriculomegaly, or mass effect. 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 Generalized cerebral atrophy. Periventricular white matter low attenuation likely secondary to microangiopathy. Vascular: Cerebrovascular atherosclerotic calcifications are noted. Skull: Negative for fracture or focal lesion. Sinuses/Orbits: Visualized portions of the orbits are unremarkable. Visualized portions of the paranasal sinuses are unremarkable. Visualized portions of the mastoid air cells are unremarkable. Other: None. IMPRESSION: 1.   1.  No acute intracranial pathology. 2. Chronic microvascular disease and cerebral atrophy. Electronically Signed   By: Kathreen Devoid M.D.   On: 03/22/2022 16:26   DG Pelvis Portable  Result Date: 03/22/2022 CLINICAL DATA:  Fall. EXAM: PORTABLE PELVIS 1-2 VIEWS COMPARISON:  Abdominal radiograph 06/18/2019 FINDINGS: Diffuse osteopenia. Pelvic bony ring is intact. No gross abnormality to either hip. Atherosclerotic calcifications in the upper thighs. Bowel gas throughout the lower abdomen and pelvis. IMPRESSION: No acute bone abnormality in the pelvis. Electronically Signed   By: Markus Daft M.D.   On: 03/22/2022 15:25   DG Chest Portable 1 View  Result Date: 03/22/2022 CLINICAL  DATA:  Fall.  Trauma. EXAM: PORTABLE CHEST 1 VIEW COMPARISON:  Chest x-ray 03/20/2021. FINDINGS: Patient is rotated. The cardiomediastinal silhouette is unchanged, the heart is enlarged in the aorta is tortuous. The lungs are now clear. The costophrenic angles are clear. There is no pneumothorax. No acute fractures are seen. Healed left clavicular fracture is present. IMPRESSION: No acute cardiopulmonary process. Electronically Signed   By: Ronney Asters M.D.   On: 03/22/2022 15:24    Procedures Procedures    Medications Ordered in ED Medications - No data to display  ED Course/ Medical Decision Making/ A&P                           Medical Decision Making Amount and/or Complexity of Data Reviewed Radiology: ordered.   This patient presents to the ED for concern of injury from a fall on blood thinners, this involves an extensive number of treatment options, and is a complaint that carries with it a high risk of complications and morbidity.  The differential diagnosis includes intracranial injury, pelvic fracture, c-spine injury   Co morbidities that complicate the patient evaluation  Dementia, hypertension, hyperlipidemia, PAD   Additional history obtained:  Additional history obtained from EMS who reports patient's fall was witnessed by her roommate, reported to be at baseline mental status External records from outside source obtained and reviewed including recent visit to gynecology for uterine prolapse  Imaging Studies ordered:  I ordered imaging studies including CT head, C-spine, chest x-ray, pelvis x-ray I independently visualized and interpreted imaging which showed no acute abnormality I agree with the radiologist interpretation   Cardiac Monitoring: / EKG:  The patient was maintained on a cardiac monitor.  I personally viewed and interpreted the cardiac monitored which showed an underlying rhythm of: a fib, rate controlled   Problem List / ED Course / Critical  interventions / Medication management  81 year old female with dementia brought in by EMS from facility where patient had a witnessed fall and is reported to be at baseline mental status.  Patient denies any complaints or concerns.  No obvious injuries on arrival.  No midline C-spine or back tenderness.  No pain with range of motion of the extremities, pelvis is stable and nontender.  X-ray chest and pelvis negative for acute injury.  CT head and C-spine negative for acute injury.  Patient is discharged back to facility. Call to patient's son who did not answer. I have reviewed the patients  home medicines and have made adjustments as needed   Social Determinants of Health:  Dementia, lives at facility    Test / Admission - Considered:  Labs considered however with normal imaging, witnessed fall and patient at baseline, not felt necessary at this time.          Final Clinical Impression(s) / ED Diagnoses Final diagnoses:  Fall, initial encounter    Rx / DC Orders ED Discharge Orders     None         Tacy Learn, PA-C 03/22/22 1711    Isla Pence, MD 03/23/22 [phone]

## 2022-03-22 NOTE — ED Notes (Signed)
Patient moved from room 12 to room 9 for better observation. Patient continues to pull monitoring devices, purwick, and gown off. Monitoring devices applied as possible.

## 2022-03-22 NOTE — ED Notes (Signed)
RN transport to CT

## 2022-03-23 NOTE — ED Notes (Signed)
Patient repositioned in bed.  Gown replaced and warm blankets provided.  Patient reoriented to plan of care.  Lights in room turned off to allow patient is rest

## 2022-03-23 NOTE — ED Notes (Signed)
Patient actively sleeping.  Waiting for [facility] for transportation.  Removed all monitoring equipment.  Will obtain vitals once patient is awake

## 2022-07-11 ENCOUNTER — Ambulatory Visit: Payer: Medicare Other | Admitting: Obstetrics and Gynecology

## 2022-08-11 ENCOUNTER — Telehealth (HOSPITAL_COMMUNITY): Payer: Self-pay | Admitting: Emergency Medicine

## 2022-08-11 ENCOUNTER — Encounter (HOSPITAL_COMMUNITY): Payer: Self-pay | Admitting: Pharmacy Technician

## 2022-08-11 ENCOUNTER — Emergency Department (HOSPITAL_COMMUNITY)
Admission: EM | Admit: 2022-08-11 | Discharge: 2022-08-12 | Disposition: A | Discharge status: Skilled Nursing Facility | Payer: Medicare Other | Attending: Emergency Medicine | Admitting: Emergency Medicine

## 2022-08-11 ENCOUNTER — Emergency Department (HOSPITAL_COMMUNITY): Payer: Medicare Other

## 2022-08-11 ENCOUNTER — Other Ambulatory Visit: Payer: Self-pay

## 2022-08-11 DIAGNOSIS — D72829 Elevated white blood cell count, unspecified: Secondary | ICD-10-CM | POA: Diagnosis not present

## 2022-08-11 DIAGNOSIS — I10 Essential (primary) hypertension: Secondary | ICD-10-CM | POA: Insufficient documentation

## 2022-08-11 DIAGNOSIS — M25571 Pain in right ankle and joints of right foot: Secondary | ICD-10-CM | POA: Diagnosis not present

## 2022-08-11 DIAGNOSIS — Z7901 Long term (current) use of anticoagulants: Secondary | ICD-10-CM | POA: Diagnosis not present

## 2022-08-11 DIAGNOSIS — W19XXXA Unspecified fall, initial encounter: Secondary | ICD-10-CM

## 2022-08-11 DIAGNOSIS — N390 Urinary tract infection, site not specified: Secondary | ICD-10-CM

## 2022-08-11 DIAGNOSIS — F0390 Unspecified dementia without behavioral disturbance: Secondary | ICD-10-CM | POA: Diagnosis not present

## 2022-08-11 DIAGNOSIS — E876 Hypokalemia: Secondary | ICD-10-CM | POA: Diagnosis not present

## 2022-08-11 DIAGNOSIS — Z8673 Personal history of transient ischemic attack (TIA), and cerebral infarction without residual deficits: Secondary | ICD-10-CM | POA: Insufficient documentation

## 2022-08-11 DIAGNOSIS — G8929 Other chronic pain: Secondary | ICD-10-CM | POA: Diagnosis not present

## 2022-08-11 DIAGNOSIS — R471 Dysarthria and anarthria: Secondary | ICD-10-CM

## 2022-08-11 DIAGNOSIS — Z79899 Other long term (current) drug therapy: Secondary | ICD-10-CM | POA: Diagnosis not present

## 2022-08-11 DIAGNOSIS — I4891 Unspecified atrial fibrillation: Secondary | ICD-10-CM | POA: Diagnosis not present

## 2022-08-11 DIAGNOSIS — W01198A Fall on same level from slipping, tripping and stumbling with subsequent striking against other object, initial encounter: Secondary | ICD-10-CM | POA: Insufficient documentation

## 2022-08-11 LAB — URINALYSIS, ROUTINE W REFLEX MICROSCOPIC
Bilirubin Urine: NEGATIVE
Glucose, UA: NEGATIVE mg/dL
Ketones, ur: NEGATIVE mg/dL
Nitrite: NEGATIVE
Protein, ur: NEGATIVE mg/dL
Specific Gravity, Urine: 1.012 (ref 1.005–1.030)
pH: 6 (ref 5.0–8.0)

## 2022-08-11 LAB — CBC
HCT: 37.8 % (ref 36.0–46.0)
Hemoglobin: 12.8 g/dL (ref 12.0–15.0)
MCH: 31.3 pg (ref 26.0–34.0)
MCHC: 33.9 g/dL (ref 30.0–36.0)
MCV: 92.4 fL (ref 80.0–100.0)
Platelets: 295 10*3/uL (ref 150–400)
RBC: 4.09 MIL/uL (ref 3.87–5.11)
RDW: 12.6 % (ref 11.5–15.5)
WBC: 11.7 10*3/uL — ABNORMAL HIGH (ref 4.0–10.5)
nRBC: 0 % (ref 0.0–0.2)

## 2022-08-11 LAB — I-STAT CHEM 8, ED
BUN: 10 mg/dL (ref 8–23)
Calcium, Ion: 1.17 mmol/L (ref 1.15–1.40)
Chloride: 101 mmol/L (ref 98–111)
Creatinine, Ser: 0.8 mg/dL (ref 0.44–1.00)
Glucose, Bld: 141 mg/dL — ABNORMAL HIGH (ref 70–99)
HCT: 38 % (ref 36.0–46.0)
Hemoglobin: 12.9 g/dL (ref 12.0–15.0)
Potassium: 3.5 mmol/L (ref 3.5–5.1)
Sodium: 137 mmol/L (ref 135–145)
TCO2: 25 mmol/L (ref 22–32)

## 2022-08-11 LAB — COMPREHENSIVE METABOLIC PANEL
ALT: 12 U/L (ref 0–44)
AST: 24 U/L (ref 15–41)
Albumin: 3.6 g/dL (ref 3.5–5.0)
Alkaline Phosphatase: 78 U/L (ref 38–126)
Anion gap: 15 (ref 5–15)
BUN: 9 mg/dL (ref 8–23)
CO2: 21 mmol/L — ABNORMAL LOW (ref 22–32)
Calcium: 9.8 mg/dL (ref 8.9–10.3)
Chloride: 101 mmol/L (ref 98–111)
Creatinine, Ser: 0.85 mg/dL (ref 0.44–1.00)
GFR, Estimated: >60 mL/min (ref >60)
Glucose, Bld: 141 mg/dL — ABNORMAL HIGH (ref 70–99)
Potassium: 3.2 mmol/L — ABNORMAL LOW (ref 3.5–5.1)
Sodium: 137 mmol/L (ref 135–145)
Total Bilirubin: 0.6 mg/dL (ref 0.3–1.2)
Total Protein: 6.3 g/dL — ABNORMAL LOW (ref 6.5–8.1)

## 2022-08-11 LAB — DIFFERENTIAL
Abs Immature Granulocytes: 0.03 10*3/uL (ref 0.00–0.07)
Basophils Absolute: 0.1 10*3/uL (ref 0.0–0.1)
Basophils Relative: 1 %
Eosinophils Absolute: 0.2 10*3/uL (ref 0.0–0.5)
Eosinophils Relative: 2 %
Immature Granulocytes: 0 %
Lymphocytes Relative: 23 %
Lymphs Abs: 2.7 10*3/uL (ref 0.7–4.0)
Monocytes Absolute: 0.9 10*3/uL (ref 0.1–1.0)
Monocytes Relative: 7 %
Neutro Abs: 7.9 10*3/uL — ABNORMAL HIGH (ref 1.7–7.7)
Neutrophils Relative %: 67 %

## 2022-08-11 LAB — PROTIME-INR
INR: 2 — ABNORMAL HIGH (ref 0.8–1.2)
Prothrombin Time: 22.6 seconds — ABNORMAL HIGH (ref 11.4–15.2)

## 2022-08-11 LAB — ETHANOL: Alcohol, Ethyl (B): <10 mg/dL (ref <10)

## 2022-08-11 LAB — APTT: aPTT: 42 seconds — ABNORMAL HIGH (ref 24–36)

## 2022-08-11 MED ORDER — SODIUM CHLORIDE 0.9% FLUSH: 3.0000 mL | Freq: Once | INTRAVENOUS | Status: DC

## 2022-08-11 MED ORDER — OLANZAPINE 5 MG PO TABS
5.0000 mg | ORAL_TABLET | Freq: Once | ORAL | Status: DC
  Filled 2022-08-11: qty 1

## 2022-08-11 MED ORDER — SODIUM CHLORIDE 0.9 % BOLUS PEDS
500.0000 mL | Freq: Once | INTRAVENOUS | Status: AC
  Administered 2022-08-11: 500 mL via INTRAVENOUS

## 2022-08-11 MED ORDER — POTASSIUM CHLORIDE ER 10 MEQ PO TBCR: 20.0000 meq | EXTENDED_RELEASE_TABLET | Freq: Every day | ORAL | 0 refills | Status: DC

## 2022-08-11 MED ORDER — CEFDINIR 300 MG PO CAPS: 300.0000 mg | ORAL_CAPSULE | Freq: Two times a day (BID) | ORAL | 0 refills | Status: AC

## 2022-08-11 MED ORDER — CEFDINIR 300 MG PO CAPS: 300.0000 mg | ORAL_CAPSULE | Freq: Two times a day (BID) | ORAL | 0 refills | Status: DC

## 2022-08-11 MED ORDER — POTASSIUM CHLORIDE CRYS ER 20 MEQ PO TBCR
60.0000 meq | EXTENDED_RELEASE_TABLET | Freq: Once | ORAL | Status: AC
  Administered 2022-08-11: 60 meq via ORAL
  Filled 2022-08-11: qty 3

## 2022-08-11 MED ORDER — SODIUM CHLORIDE 0.9 % IV SOLN
1.0000 g | Freq: Once | INTRAVENOUS | Status: AC
  Administered 2022-08-11: 1 g via INTRAVENOUS
  Filled 2022-08-11: qty 10

## 2022-08-11 MED ORDER — ACETAMINOPHEN 325 MG PO TABS
650.0000 mg | ORAL_TABLET | Freq: Once | ORAL | Status: AC
  Administered 2022-08-11: 650 mg via ORAL
  Filled 2022-08-11: qty 2

## 2022-08-11 NOTE — Telephone Encounter (Signed)
Previous provider did not sign paper prescriptions.  Prescriptions represcribed.

## 2022-08-11 NOTE — ED Notes (Signed)
[facility] confirmed/called

## 2022-08-11 NOTE — Consult Note (Signed)
Neurology Consultation  Reason for Consult: Code stroke  Referring Physician: Dr. Nechama Guard  CC: Code stroke- slurred speech after unwitnessed fall  History is obtained from:patient, EMS and medical record  HPI: Kendra Charles is a 81 y.o. female with past medical history of dementia, seizures, P A fib on Eliquis, HTN, HLD, and PAD, very HOH who presents from [place] for evaluation of slurred speech after unwitnessed fall. Code stroke was called by EMS. She fell out of her wheelchair and was found to have slurred speech. Patient remembers the fall and states she was in a hurry. EMS reports she has slurred speech in the setting of UTIs. LKW 0700. CT head with no acute process. NIHSS = 1 for dysarthria. TNK not administered 2/2 contraindication of therapeutic anticoagulation and mild sx favored not to be 2/2 stroke.   LKW: 0700 IV thrombolysis given?: no, not a stroke  Premorbid modified Rankin scale (mRS):  4-Needs assistance to walk and tend to bodily needs   ROS: Full ROS was performed and is negative except as noted in the HPI.    Past Medical History:  Diagnosis Date   Hypercholesteremia    Hypertension    PAD (peripheral artery disease) ([place])      Family History  Problem Relation Age of Onset   Hypertension Mother    Hypertension Father      Social History:   reports that she has quit smoking. She has never used smokeless tobacco. She reports that she does not currently use alcohol. She reports that she does not currently use drugs.  Medications  Current Facility-Administered Medications:    sodium chloride flush (NS) 0.9 % injection 3 mL, 3 mL, Intravenous, Once, Elgie Congo, MD  Current Outpatient Medications:    acetaminophen (TYLENOL) 325 MG tablet, Take 650 mg by mouth 2 (two) times daily., Disp: , Rfl:    acetaminophen (TYLENOL) 500 MG tablet, Take 500 mg by mouth every 6 (six) hours as needed for mild pain or headache (fever 99.5-101)., Disp: ,  Rfl:    amLODipine (NORVASC) 5 MG tablet, Take 5 mg by mouth daily., Disp: , Rfl:    apixaban (ELIQUIS) 5 MG TABS tablet, Take 1 tablet (5 mg total) by mouth 2 (two) times daily., Disp: 60 tablet, Rfl:    diclofenac Sodium (VOLTAREN) 1 % GEL, Apply 4 g topically 4 (four) times daily as needed (right knee pain)., Disp: , Rfl:    donepezil (ARICEPT) 5 MG tablet, Take 5 mg by mouth at bedtime., Disp: , Rfl:    hydrochlorothiazide (HYDRODIURIL) 25 MG tablet, Take 25 mg by mouth daily., Disp: , Rfl:    levETIRAcetam (KEPPRA) 500 MG tablet, Take 1 tablet (500 mg total) by mouth 2 (two) times daily., Disp:  , Rfl:    lisinopril (ZESTRIL) 40 MG tablet, Take 40 mg by mouth daily., Disp: , Rfl:    loratadine (CLARITIN) 10 MG tablet, Take 10 mg by mouth daily., Disp: , Rfl:    lovastatin (MEVACOR) 40 MG tablet, Take 40 mg by mouth at bedtime. , Disp: , Rfl:    Melatonin-Theanine 10-5.5 MG TABS, Take 1 tablet by mouth at bedtime., Disp: , Rfl:    metoprolol tartrate (LOPRESSOR) 50 MG tablet, Take 50 mg by mouth 2 (two) times daily., Disp: , Rfl:    Multiple Vitamin (MULTIVITAMIN) tablet, Take 1 tablet by mouth daily., Disp: , Rfl:    traZODone (DESYREL) 50 MG tablet, Take 50 mg by mouth at bedtime., Disp: ,  Rfl:    Exam: Current vital signs: Wt 50.8 kg   BMI 19.84 kg/m  Vital signs in last 24 hours: Weight:  [50.8 kg] 50.8 kg (10/28 0900)  GENERAL: Awake, alert in NAD HEENT: - Normocephalic and atraumatic, dry mm in C -collar LUNGS - Clear to auscultation bilaterally with no wheezes CV - S1S2 RRR, no m/r/g, equal pulses bilaterally. ABDOMEN - Soft, nontender, nondistended with normoactive BS Ext: warm, well perfused, intact peripheral pulses, no edema  NEURO:  Mental Status: AA&Ox3 Language: speech is dysarthric   Naming, repetition, fluency, and comprehension intact. Cranial Nerves: PERRL EOMI, visual fields full, no facial asymmetry, facial sensation intact, hearing intact, tongue/uvula/soft  palate midline, normal sternocleidomastoid and trapezius muscle strength. No evidence of tongue atrophy or fibrillations Motor: 5/5 in all 4 extremities  Tone: is normal and bulk is normal Sensation- Intact to light touch bilaterally Coordination: FTN intact bilaterally Gait- deferred  NIHSS 1a Level of Conscious.: 0 1b LOC Questions: 0 1c LOC Commands: 0 2 Best Gaze: 0 3 Visual: 0 4 Facial Palsy: 0 5a Motor Arm - left: 0 5b Motor Arm - Right: 0 6a Motor Leg - Left: 0 6b Motor Leg - Right: 0 7 Limb Ataxia: 0 8 Sensory: 0 9 Best Language: 0 10 Dysarthria: 1 11 Extinct. and Inatten.: 0 TOTAL: 0   Labs I have reviewed labs in epic and the results pertinent to this consultation are:  CBC    Component Value Date/Time   WBC 13.8 (H) 01/19/2022 1036   RBC 4.32 01/19/2022 1036   HGB 12.9 08/11/2022 0926   HCT 38.0 08/11/2022 0926   PLT 304 01/19/2022 1036   MCV 89.6 01/19/2022 1036   MCH 31.0 01/19/2022 1036   MCHC 34.6 01/19/2022 1036   RDW 12.5 01/19/2022 1036   LYMPHSABS 2.2 01/19/2022 1036   MONOABS 0.9 01/19/2022 1036   EOSABS 0.1 01/19/2022 1036   BASOSABS 0.1 01/19/2022 1036    CMP     Component Value Date/Time   NA 137 08/11/2022 0926   K 3.5 08/11/2022 0926   CL 101 08/11/2022 0926   CO2 22 01/19/2022 1036   GLUCOSE 141 (H) 08/11/2022 0926   BUN 10 08/11/2022 0926   CREATININE 0.80 08/11/2022 0926   CALCIUM 9.5 01/19/2022 1036   PROT 5.3 (L) 03/23/2021 0223   ALBUMIN 2.4 (L) 03/23/2021 0223   AST 87 (H) 03/23/2021 0223   ALT 51 (H) 03/23/2021 0223   ALKPHOS 59 03/23/2021 0223   BILITOT 0.6 03/23/2021 0223   GFRNONAA >60 01/19/2022 1036   GFRAA NOT CALCULATED 09/08/2019 0328    Lipid Panel  No results found for: "CHOL", "TRIG", "HDL", "CHOLHDL", "VLDL", "LDLCALC", "LDLDIRECT"   Imaging I have reviewed the images obtained:  CT-head No acute cortically based infarct or acute intracranial hemorrhage identified. ASPECTS 10.  Assessment:   Kendra Charles is a 81 y.o. female with past medical history of dementia, seizures, P A fib on Eliquis, HTN, HLD, and PAD, very HOH who presents from [place] for evaluation of slurred speech after unwitnessed fall. She fell out of her wheelchair and was found to have slurred speech. Patient remembers the fall and states she was in a hurry. EMS reports she has slurred speech in the setting of UTIs. LKW 0700. CT head with no acute process. NIHSS = 1 for dysarthria. TNK not administered 2/2 contraindication of therapeutic anticoagulation and mild sx favored not to be 2/2 stroke. MRI brain wo contrast performed after the  stroke code showed no acute infarct.   Recommendations: - UA, workup for infectious etiologies and metabolic arrangements per ED - No further stroke workup indicated given low suspicion for ischemic event and MRI brain neg for stroke - Neurology to sign off but please re-engage if additional neurologic concerns arise  Beulah Gandy DNP, ACNPC-AG    Attending Neurohospitalist Addendum Patient seen and examined with APP/Resident. Agree with the history and physical as documented above. Agree with the plan as documented, which I helped formulate. I have edited the note above to reflect my full findings and recommendations. I have independently reviewed the chart, obtained history, review of systems and examined the patient.I have personally reviewed pertinent head/neck/spine imaging (CT/MRI). Please feel free to call with any questions.  -- Su Monks, MD [facility] [phone]  If 7pm- 7am, please page neurology on call as listed in [place].

## 2022-08-11 NOTE — Discharge Instructions (Signed)
Kendra Charles was seen in the ER today for some mildly slurred speech.  Her MRI showed no evidence of stroke or bleed.  We suspect her symptoms could be related to a UTI.  Make sure she continues antibiotics as prescribed for the next week.  Also her potassium was slightly low.  She has been given 3 days of repletion.  Have her follow-up with her primary care doctor regarding her visit to the ER today.  Come back if she has any new or worsening symptoms such as facial droop, weakness in the arms or legs, repeated falls, severe headache, or any other symptoms concerning to you.

## 2022-08-11 NOTE — ED Triage Notes (Signed)
Pt bib ems from [place] as a code stroke. LKW 0700. Pt had an unwitnessed fall, roommate called for help. Staff placed pt back in her wheelchair. Pt with slurred speech post fall. Pt on eliquis. Arrives in a ccollar. Hx dementia.

## 2022-08-11 NOTE — ED Provider Notes (Signed)
[facility] EMERGENCY DEPARTMENT Provider Note   CSN: [id number] Arrival date & time: 08/11/22  1194  An emergency department physician performed an initial assessment on this suspected stroke patient at 64.  History  Chief Complaint  Patient presents with   Code Stroke    Kendra Charles is a 81 y.o. female.  With PMH of A-fib on Eliquis, seizures, HTN, HLD, PAD, history of hyponatremia brought in by EMS from SNF due to unwitnessed fall and CHI with resulting dysarthria called as code stroke for new dysarthria. LKN 0700 today.  According to EMS and the facility and history obtained by neurology from family, patient was seen last known normal this morning at 7 AM at her facility when receiving her morning meds.  However, roommate called out when patient had followed out of her wheelchair and hit her head with unclear loss of consciousness.  There were no reported or known seizure activity.  She was noted to have some mild slurred speech after the fall with no localizing weakness and was called as a code stroke because she is on Eliquis as well.  Family noted that whenever she has slurred speech or the symptoms is typically due to a urinary tract infection.  Difficult to obtain history from patient due to underlying known dementia.  She says she remembers the fall but does not remember how it happened.  She does not think she lost any consciousness.  She is denying any headache or neck pain.  She is complaining of chronic pain in her right ankle.  She is denying any weakness numbness or tingling.  HPI     Home Medications Prior to Admission medications   Medication Sig Start Date End Date Taking? Authorizing Provider  acetaminophen (TYLENOL) 325 MG tablet Take 650 mg by mouth 2 (two) times daily.   Yes [provider]  acetaminophen (TYLENOL) 500 MG tablet Take 500 mg by mouth every 6 (six) hours as needed for mild pain or headache (fever 99.5-101).   Yes  [provider]  alum & mag hydroxide-simeth (MAALOX/MYLANTA) 200-200-20 MG/5 SUSP Apply 20 mLs topically 3 (three) times daily as needed (indigestion).   Yes [provider]  amLODipine (NORVASC) 5 MG tablet Take 5 mg by mouth daily.   Yes [provider]  apixaban (ELIQUIS) 5 MG TABS tablet Take 1 tablet (5 mg total) by mouth 2 (two) times daily. 06/23/19  Yes Earlene Plater, MD  cefdinir (OMNICEF) 300 MG capsule Take 1 capsule (300 mg total) by mouth 2 (two) times daily for 7 days. 08/11/22 08/18/22 Yes Elgie Congo, MD  diclofenac Sodium (VOLTAREN) 1 % GEL Apply 4 g topically 3 (three) times daily.   Yes [provider]  donepezil (ARICEPT) 5 MG tablet Take 5 mg by mouth at bedtime.   Yes [provider]  doxycycline (VIBRA-TABS) 100 MG tablet Take 100 mg by mouth 2 (two) times daily. 08/08/22  Yes [provider]  guaifenesin (ROBITUSSIN) 100 MG/5ML syrup Take 200 mg by mouth every 6 (six) hours as needed for cough.   Yes [provider]  levETIRAcetam (KEPPRA) 500 MG tablet Take 1 tablet (500 mg total) by mouth 2 (two) times daily. 06/25/19  Yes Earlene Plater, MD  lisinopril (ZESTRIL) 40 MG tablet Take 40 mg by mouth daily.   Yes [provider]  loperamide (IMODIUM) 2 MG capsule Take 2 mg by mouth daily as needed for diarrhea or loose stools.   Yes [provider]  loratadine (CLARITIN) 10 MG tablet Take 10 mg by mouth daily.   Yes [provider]  LORazepam (ATIVAN) 0.5 MG tablet Take 0.5 mg by mouth 2 (two) times daily.   Yes [provider]  lovastatin (MEVACOR) 40 MG tablet Take 40 mg by mouth at bedtime.  06/17/19  Yes [provider]  magnesium hydroxide (MILK OF MAGNESIA) 400 MG/5ML suspension Take 30 mLs by mouth daily as needed for mild constipation.   Yes [provider]  Melatonin-Theanine 10-5.5 MG TABS Take 1 tablet by mouth at bedtime.   Yes [provider]  metoprolol tartrate (LOPRESSOR) 50 MG tablet Take 50 mg by mouth 2 (two) times daily.   Yes [provider]  Multiple Vitamins-Minerals (MULTIVITAMIN WITH MINERALS) tablet Take 1 tablet by mouth daily.   Yes [provider]  ondansetron (ZOFRAN-ODT) 4 MG disintegrating tablet Take 4 mg by mouth every 6 (six) hours as needed for nausea or vomiting.   Yes [provider]  potassium chloride (KLOR-CON) 10 MEQ tablet Take 2 tablets (20 mEq total) by mouth daily for 3 days. 08/11/22 08/14/22 Yes Elgie Congo, MD  sodium chloride 1 g tablet Take 1 g by mouth every other day.   Yes [provider]  traMADol (ULTRAM) 50 MG tablet Take 25 mg by mouth 2 (two) times daily. For knee pain   Yes [provider]  traZODone (DESYREL) 50 MG tablet Take 50 mg by mouth at bedtime.   Yes [provider]      Allergies    Patient has no known allergies.    Review of Systems   Review of Systems  Physical Exam Updated Vital Signs BP (!) 144/88 (BP Location: Left Arm)   Pulse 87   Temp 97.7 F (36.5 C) (Oral)   Resp (!) 23   Wt 50.8 kg   SpO2 96%   BMI 19.84 kg/m  Physical Exam Constitutional: Alert and oriented to person and place no acute distress nontoxic and following all commands Eyes: Conjunctivae are normal. ENT      Head: Normocephalic and small left frontal head contusion      Nose: No congestion.      Mouth/Throat: Dentures present with crack in midline upper dentures      Neck: No stridor. Cardiovascular: S1, S2, borderline tachycardic, equal distal DP pulses and radial pulses Respiratory: Normal respiratory effort. Breath sounds are normal. Gastrointestinal: Soft and nontender.  Musculoskeletal: Normal range of motion in all extremities.  No pitting edema of lower extremities.  No pain with logroll of bilateral lower extremities or range of motion of large joints.  No pain with passive range of motion of bilateral  upper extremities.  No midline tenderness step-offs or deformities of C/T/L-spine.      Right lower leg: Small circular ulcer on right lateral malleolus with minimal surrounding erythema with no active drainage      Left lower leg: No tenderness or edema. Neurologic: Very mild dysarthria no aphasia.  No facial droop.  Tongue is midline.   PERRL. EOMI. 5 out of 5 strength bilateral upper and lower extremities.  Sensation grossly intact.  Skin: Skin is warm, dry and intact. No rash noted. Psychiatric: Mood and affect are normal. Speech and behavior are normal.  ED Results / Procedures / Treatments   Labs (all labs ordered are listed, but only abnormal results are displayed) Labs Reviewed  PROTIME-INR - Abnormal; Notable for the following components:  Result Value   Prothrombin Time 22.6 (*)    INR 2.0 (*)    All other components within normal limits  APTT - Abnormal; Notable for the following components:   aPTT 42 (*)    All other components within normal limits  CBC - Abnormal; Notable for the following components:   WBC 11.7 (*)    All other components within normal limits  DIFFERENTIAL - Abnormal; Notable for the following components:   Neutro Abs 7.9 (*)    All other components within normal limits  COMPREHENSIVE METABOLIC PANEL - Abnormal; Notable for the following components:   Potassium 3.2 (*)    CO2 21 (*)    Glucose, Bld 141 (*)    Total Protein 6.3 (*)    All other components within normal limits  URINALYSIS, ROUTINE W REFLEX MICROSCOPIC - Abnormal; Notable for the following components:   Color, Urine AMBER (*)    APPearance CLOUDY (*)    Hgb urine dipstick MODERATE (*)    Leukocytes,Ua SMALL (*)    Bacteria, UA FEW (*)    All other components within normal limits  I-STAT CHEM 8, ED - Abnormal; Notable for the following components:   Glucose, Bld 141 (*)    All other components within normal limits  URINE CULTURE  ETHANOL  CBG MONITORING, ED    EKG EKG  Interpretation  Date/Time:  Saturday August 11 2022 09:41:46 EDT Ventricular Rate:  82 PR Interval:    QRS Duration: 89 QT Interval:  396 QTC Calculation: 463 R Axis:   50 Text Interpretation: Atrial fibrillation Probable anteroseptal infarct, old No significant change since last tracing Confirmed by Georgina Snell [phone]) on 08/11/2022 10:01:48 AM  Radiology DG Chest Portable 1 View  Result Date: 08/11/2022 CLINICAL DATA:  Altered mental status EXAM: PORTABLE CHEST 1 VIEW COMPARISON:  Chest radiograph dated 03/22/2022 FINDINGS: The heart size is enlarged. Vascular calcifications are seen in the aortic arch. Both lungs are clear. Degenerative changes are seen in the spine. IMPRESSION: Cardiomegaly. No active disease. Electronically Signed   By: Zerita Boers M.D.   On: 08/11/2022 13:11   MR BRAIN WO CONTRAST  Result Date: 08/11/2022 CLINICAL DATA:  81 year old female.  Slurred speech and fall. EXAM: MRI HEAD WITHOUT CONTRAST TECHNIQUE: Multiplanar, multiecho pulse sequences of the brain and surrounding structures were obtained without intravenous contrast. COMPARISON:  Plain head CT 0919 hours today.  Brain MRI 06/18/2019. FINDINGS: Study is intermittently degraded by motion artifact despite repeated imaging attempts. Brain: No restricted diffusion or evidence of acute infarction. Extensive chronic signal heterogeneity in the bilateral deep gray matter nuclei appears to reflect a combination of chronic lacunar infarcts and increased perivascular spaces. Comparatively mild chronic T2 heterogeneity in the pons. Chronic posterior right hemisphere encephalomalacia at the MCA/PCA watershed area is stable since 2020. Some associated hemosiderin on SWI. Chronic microhemorrhage in the right thalamus. Patchy and confluent bilateral white matter T2 and FLAIR hyperintensity. Chronic ventriculomegaly, in part ex vacuo. No midline shift, mass effect, evidence of mass lesion, extra-axial collection or  acute intracranial hemorrhage. Cervicomedullary junction and pituitary are within normal limits. Vascular: Major intracranial vascular flow voids appear stable since 2020. Skull and upper cervical spine: Negative. Sinuses/Orbits: Negative. Other: None. IMPRESSION: 1. Intermittently motion degraded exam with no acute intracranial abnormality. 2. Advanced chronic ischemic disease appears stable since a 2020 MRI. Electronically Signed   By: Genevie Ann M.D.   On: 08/11/2022 11:46   DG Ankle Complete Right  Result Date:  08/11/2022 CLINICAL DATA:  81 year old female with lateral malleolus soft tissue ulceration. EXAM: RIGHT ANKLE - COMPLETE 3+ VIEW COMPARISON:  None Available. FINDINGS: Subtle soft tissue deformity and/or soft tissue gas at the distal metadiaphysis level of the distal right fibula, slightly above the lateral malleolus. Additional soft tissue swelling at the malleolus. No underlying cortical osteolysis. Background osteopenia. Distal tibia, talus, and calcaneus also appear intact. No evidence of ankle joint effusion. Calcaneus degenerative spurring. No tracking soft tissue gas. IMPRESSION: 1. Soft tissue swelling and wound overlying the distal fibula metadiaphysis and lateral malleolus. 2. Osteopenia. No plain radiographic evidence of osteomyelitis or acute osseous abnormality identified. Electronically Signed   By: Genevie Ann M.D.   On: 08/11/2022 10:02   CT Cervical Spine Wo Contrast  Result Date: 08/11/2022 CLINICAL DATA:  81 year old female status post fall. EXAM: CT CERVICAL SPINE WITHOUT CONTRAST TECHNIQUE: Multidetector CT imaging of the cervical spine was performed without intravenous contrast. Multiplanar CT image reconstructions were also generated. RADIATION DOSE REDUCTION: This exam was performed according to the departmental dose-optimization program which includes automated exposure control, adjustment of the mA and/or kV according to patient size and/or use of iterative reconstruction  technique. COMPARISON:  Cervical spine CT 03/22/2022. FINDINGS: Alignment: Stable exaggerated cervical lordosis. Cervicothoracic junction alignment is within normal limits. Bilateral posterior element alignment is within normal limits. Skull base and vertebrae: Osteopenia. Visualized skull base is intact. No atlanto-occipital dissociation. Stable C1-C2 alignment. No acute osseous abnormality identified. Soft tissues and spinal canal: No prevertebral fluid or swelling. No visible canal hematoma. Bulky calcified carotid atherosclerosis in the bilateral neck. Otherwise negative noncontrast visible neck soft tissues. Disc levels: Cervical spine degeneration but capacious spinal canal. Upper chest: Partially visible chronic T2 superior endplate compression. Centrilobular emphysema in the lung apices. Calcified great vessel atherosclerosis. IMPRESSION: 1. No acute traumatic injury identified in the cervical spine. 2. Osteopenia. Calcified carotid atherosclerosis. Emphysema (ICD10-J43.9). Electronically Signed   By: Genevie Ann M.D.   On: 08/11/2022 09:44   CT HEAD CODE STROKE WO CONTRAST  Result Date: 08/11/2022 CLINICAL DATA:  Code stroke.  81 year old female EXAM: CT HEAD WITHOUT CONTRAST TECHNIQUE: Contiguous axial images were obtained from the base of the skull through the vertex without intravenous contrast. RADIATION DOSE REDUCTION: This exam was performed according to the departmental dose-optimization program which includes automated exposure control, adjustment of the mA and/or kV according to patient size and/or use of iterative reconstruction technique. COMPARISON:  Brain MRI 06/18/2019.  Head CT 03/22/2022. FINDINGS: Brain: Chronic cerebral volume loss and encephalomalacia with ex vacuo ventricular enlargement. Chronic bilateral lacunar infarcts in the deep gray matter nuclei. Chronic posterior right MCA/PCA watershed encephalomalacia. Confluent additional bilateral cerebral white matter hypodensity. No  cortically based acute infarct identified. No acute intracranial hemorrhage identified. No midline shift, mass effect, or evidence of intracranial mass lesion. Vascular: Calcified atherosclerosis at the skull base. No suspicious intracranial vascular hyperdensity. Skull: Osteopenia.  No acute osseous abnormality identified. Sinuses/Orbits: Visualized paranasal sinuses and mastoids are clear. Other: Mild leftward gaze now. Visualized scalp soft tissues are within normal limits. ASPECTS [facility] Stroke Program Early CT Score) Total score (0-10 with 10 being normal): 10 IMPRESSION: 1. No acute cortically based infarct or acute intracranial hemorrhage identified. ASPECTS 10. 2. Stable CT appearance of advanced chronic ischemic disease. 3. These results were communicated to Dr. Quinn Axe at 9:35 am on 08/11/2022 by text page via the [facility] messaging system. Electronically Signed   By: Genevie Ann M.D.   On: 08/11/2022 09:35  Procedures Procedures  Remain on constant cardiac monitoring which I personally reviewed, remained in atrial fibrillation  Medications Ordered in ED Medications  sodium chloride flush (NS) 0.9 % injection 3 mL (has no administration in time range)  potassium chloride SA (KLOR-CON M) CR tablet 60 mEq (60 mEq Oral Given 08/11/22 1207)  acetaminophen (TYLENOL) tablet 650 mg (650 mg Oral Given 08/11/22 1207)  0.9% NaCl bolus PEDS (500 mLs Intravenous New Bag/Given 08/11/22 1208)  cefTRIAXone (ROCEPHIN) 1 g in sodium chloride 0.9 % 100 mL IVPB (1 g Intravenous New Bag/Given 08/11/22 1206)    ED Course/ Medical Decision Making/ A&P Clinical Course as of 08/11/22 1338  Sat Aug 11, 2022  1255 Attempted calling patient's son and daughter listed in chart twice to discuss results today.  No answer from either child.  Since patient had only mild dysarthria but otherwise at neurologic baseline with no concerning deficits and MRI negative for stroke think she would be more appropriate discharge back to  her facility as I do not want her to remain in the hospital and possibly become more delirious.  She has received 1 dose of IV Rocephin here with potassium repletion and fluids.  No concern for sepsis.  We will discharge her on cefdinir and potassium repletion and advise close follow-up with PCP. [VB]    Clinical Course User Index [VB] Elgie Congo, MD                           Medical Decision Making Dura Mccormack is a 81 y.o. female.  With PMH of A-fib on Eliquis, seizures, HTN, HLD, PAD, history of hyponatremia brought in by EMS from SNF due to unwitnessed fall and CHI with resulting dysarthria called as code stroke for new dysarthria. LKN 0700 today.  Patient presents with mild dysarthria in the setting of broken dentures and recent fall.  She was called a code stroke due to dysarthria.  However on exam there are no other focal neurologic deficits.  Patient was taken emergently to CT head which I personally reviewed with no evidence of ICH.  She additionally had a CT C-spine performed due to fall which showed no evidence of traumatic injury.  Patient seen and evaluated by neurologist Dr. Quinn Axe on arrival who has low suspicion for stroke.  She recommends MRI and if negative no further stroke work-up.  No concern for seizure although known his seizure history as she is not postictal on exam and according to facility no seizure like activity demonstrated.  Suspect more likely toxic/metabolic underlying cause of fall today or possible UTI per patient's family this is common.  Labs were sent and notable for a mild leukocytosis 11.7 with left shift.  She has no anemia hemoglobin 12.8.  Her glucose was 141.Mild hypokalemia 3.2, repleted with oral potassium 60 mEq.  UA consistent with UTI with small leukocyte esterase and 21-50 RBCs and 11-20 WBCs.  Sent for urine culture and covering with IV Rocephin which will also cover for possible superimposed mild cellulitis of right ankle wound.  X-ray of  right ankle showed no gas or concerning features other than mild soft tissue swelling at distal fibula and lateral malleolus where she has ulcer.  No purulent discharge, no concern for MRSA.  MRI brain showed no evidence of stroke.  Chest x-ray with no evidence of consolidation concerning for pneumonia, no pneumothorax.  Since patient only had mild dysarthria but otherwise no focal neurologic deficits and  appears to be at neurologic baseline other than mild dysarthria, suspect she would be safe for to be discharged back to facility on antibiotics and remain in hospital and possibly developed delirium.  Will discharge with course of cefdinir and 3 days of oral potassium for repletion.  Try to update patient's family members over the phone multiple times without answer.  She can follow-up with her PCP and return precautions.  Amount and/or Complexity of Data Reviewed Labs: ordered. Radiology: ordered.  Risk OTC drugs. Prescription drug management.    Final Clinical Impression(s) / ED Diagnoses Final diagnoses:  Fall, initial encounter  Dysarthria  Hypokalemia  Urinary tract infection without hematuria, site unspecified    Rx / DC Orders ED Discharge Orders          Ordered    cefdinir (OMNICEF) 300 MG capsule  2 times daily        08/11/22 1254    potassium chloride (KLOR-CON) 10 MEQ tablet  Daily        08/11/22 1254              Elgie Congo, MD 08/11/22 1338

## 2022-08-14 LAB — URINE CULTURE: Culture: 20000 — AB

## 2022-08-15 ENCOUNTER — Telehealth (HOSPITAL_BASED_OUTPATIENT_CLINIC_OR_DEPARTMENT_OTHER): Payer: Self-pay | Admitting: *Deleted

## 2022-08-15 NOTE — Telephone Encounter (Signed)
Post ED Visit - Positive Culture Follow-up  Culture report reviewed by antimicrobial stewardship pharmacist: Short Team '[]'$  Elenor Quinones, Pharm.D. '[x]'$  Heide Guile, Pharm.D., BCPS AQ-ID '[]'$  Parks Neptune, Pharm.D., BCPS '[]'$  Alycia Rossetti, Pharm.D., BCPS '[]'$  [place], Pharm.D., BCPS, AAHIVP '[]'$  Legrand Como, Pharm.D., BCPS, AAHIVP '[]'$  Salome Arnt, PharmD, BCPS '[]'$  Johnnette Gourd, PharmD, BCPS '[]'$  Hughes Better, PharmD, BCPS '[]'$  Leeroy Cha, PharmD '[]'$  Laqueta Linden, PharmD, BCPS '[]'$  Albertina Parr, PharmD  [place] Team '[]'$  Leodis Sias, PharmD '[]'$  Lindell Spar, PharmD '[]'$  Royetta Asal, PharmD '[]'$  Graylin Shiver, Rph '[]'$  Rema Fendt) Glennon Mac, PharmD '[]'$  Arlyn Dunning, PharmD '[]'$  Netta Cedars, PharmD '[]'$  Dia Sitter, PharmD '[]'$  Leone Haven, PharmD '[]'$  Gretta Arab, PharmD '[]'$  Theodis Shove, PharmD '[]'$  Peggyann Juba, PharmD '[]'$  Reuel Boom, PharmD   Positive urine culture Treated with Cefdinir, organism sensitive to the same and no further patient follow-up is required at this time.  Harlon Flor [facility] 08/15/2022, 9:41 AM

## 2022-10-12 ENCOUNTER — Ambulatory Visit: Payer: Medicare Other | Admitting: Obstetrics and Gynecology

## 2022-12-11 ENCOUNTER — Encounter: Payer: Self-pay | Admitting: Obstetrics and Gynecology

## 2022-12-11 ENCOUNTER — Other Ambulatory Visit (HOSPITAL_COMMUNITY)
Admission: RE | Admit: 2022-12-11 | Discharge: 2022-12-11 | Disposition: A | Discharge status: Home / Self Care | Payer: Medicare Other | Attending: Obstetrics and Gynecology | Admitting: Obstetrics and Gynecology

## 2022-12-11 ENCOUNTER — Ambulatory Visit (INDEPENDENT_AMBULATORY_CARE_PROVIDER_SITE_OTHER): Payer: Medicare Other | Admitting: Obstetrics and Gynecology

## 2022-12-11 VITALS — BP 134/84 | HR 31 | Wt 114.0 lb

## 2022-12-11 DIAGNOSIS — N816 Rectocele: Secondary | ICD-10-CM

## 2022-12-11 DIAGNOSIS — R350 Frequency of micturition: Secondary | ICD-10-CM

## 2022-12-11 DIAGNOSIS — N813 Complete uterovaginal prolapse: Secondary | ICD-10-CM | POA: Diagnosis not present

## 2022-12-11 DIAGNOSIS — N8110 Cystocele, unspecified: Secondary | ICD-10-CM

## 2022-12-11 DIAGNOSIS — R82998 Other abnormal findings in urine: Secondary | ICD-10-CM | POA: Insufficient documentation

## 2022-12-11 DIAGNOSIS — N393 Stress incontinence (female) (male): Secondary | ICD-10-CM | POA: Diagnosis not present

## 2022-12-11 DIAGNOSIS — N3281 Overactive bladder: Secondary | ICD-10-CM | POA: Diagnosis not present

## 2022-12-11 LAB — POCT URINALYSIS DIPSTICK
Bilirubin, UA: NEGATIVE
Blood, UA: NEGATIVE
Glucose, UA: NEGATIVE
Ketones, UA: NEGATIVE
Nitrite, UA: NEGATIVE
Protein, UA: NEGATIVE
Spec Grav, UA: 1.02 (ref 1.010–1.025)
Urobilinogen, UA: 0.2 E.U./dL
pH, UA: 5.5 (ref 5.0–8.0)

## 2022-12-11 MED ORDER — MIRABEGRON ER 50 MG PO TB24: 50.0000 mg | ORAL_TABLET | Freq: Every day | ORAL | 5 refills | Status: DC

## 2022-12-11 NOTE — Progress Notes (Signed)
[facility] Urogynecology New Patient Evaluation and Consultation  Referring Provider: Radene Gunning, MD PCP: Patient, No Pcp Per Date of Service: 12/11/2022  SUBJECTIVE Chief Complaint: New Patient (Initial Visit) Kendra Charles is a 82 y.o. female here for a consult for prolapse./)  History of Present Illness: Kendra Charles is a 82 y.o. White or Caucasian female seen in consultation at the request of Dr. Damita Dunnings for evaluation of prolapse.    Review of records significant for: Went to the ER on 4/4 for prolapse. Complete prolapse noted on exam with Dr Damita Dunnings   Urinary Symptoms: Leaks urine with cough/ sneeze, laughing, with a full bladder, with movement to the bathroom, with urgency, while asleep, and continuously Daughter in law reports that she was living with her sister until a few years ago. She started having more accidents. Leaks 7 time(s) per day.  Pad use: 8 adult diapers per day.   She is bothered by her UI symptoms.  Day time voids 6.  Nocturia: 4 times per night to void. Voiding dysfunction: she empties her bladder well.  does not use a catheter to empty bladder.  When urinating, she feels dribbling after finishing Drinks: 1-2 sodas (ginger ale), 2-3 cups coffee,  maybe a cup of water per day  UTIs:  0  UTI's in the last year.   Denies history of blood in urine and kidney or bladder stones  Pelvic Organ Prolapse Symptoms:                  Admits to a feeling of a bulge the vaginal area. Assisted living facility noticed a bulge This bulge is not bothersome.  Bowel Symptom: Bowel movements: every other day Stool consistency: soft  Straining: yes.  Splinting: no.  Incomplete evacuation: no.  She Denies accidental bowel leakage / fecal incontinence Bowel regimen: none   Sexual Function Sexually active: no.    Pelvic Pain Admits to pelvic pain   Past Medical History:  Past Medical History:  Diagnosis Date   Hypercholesteremia    Hypertension    PAD  (peripheral artery disease) ([place])      Past Surgical History:  History reviewed. No pertinent surgical history.   Past OB/GYN History: OB History  Gravida Para Term Preterm AB Living  4         4  SAB IAB Ectopic Multiple Live Births          4    # Outcome Date GA Lbr Len/2nd Weight Sex Delivery Anes PTL Lv  4 Gravida           3 Gravida           2 Gravida           1 Gravida             Vaginal deliveries: 4,  Forceps/ Vacuum deliveries: 0, Cesarean section: 0 Menopausal: Denies vaginal bleeding since menopause    Medications: She has a current medication list which includes the following prescription(s): acetaminophen, acetaminophen, alum & mag hydroxide-simeth, amlodipine, apixaban, diclofenac sodium, donepezil, doxycycline, guaifenesin, levetiracetam, lisinopril, loperamide, loratadine, lorazepam, lovastatin, magnesium hydroxide, melatonin-theanine, metoprolol tartrate, multivitamin with minerals, ondansetron, sodium chloride, tramadol, and trazodone.   Allergies: Patient has No Known Allergies.   Social History:  Social History   Tobacco Use   Smoking status: Former   Smokeless tobacco: Never  Scientific laboratory technician Use: Never used  Substance Use Topics   Alcohol use: Not Currently   Drug use:  Not Currently    Relationship status: divorced She lives in assisted living [facility]).   She is not employed. Regular exercise: No History of abuse: No  Family History:   Family History  Problem Relation Age of Onset   Hypertension Mother    Hypertension Father      Review of Systems: Review of Systems  Constitutional:  Negative for fever, malaise/fatigue and weight loss.  Respiratory:  Negative for cough, shortness of breath and wheezing.   Cardiovascular:  Negative for chest pain, palpitations and leg swelling.  Gastrointestinal:  Negative for abdominal pain and blood in stool.  Genitourinary:  Negative for dysuria.  Musculoskeletal:  Negative for  myalgias.  Skin:  Negative for rash.  Neurological:  Negative for dizziness and headaches.  Endo/Heme/Allergies:  Does not bruise/bleed easily.  Psychiatric/Behavioral:  Negative for depression. The patient is not nervous/anxious.      OBJECTIVE Physical Exam: Vitals:   12/11/22 1540  BP: 134/84  Pulse: (!) 31  Weight: 114 lb (51.7 kg)    Physical Exam Constitutional:      General: She is not in acute distress. Pulmonary:     Effort: Pulmonary effort is normal.  Abdominal:     General: There is no distension.     Palpations: Abdomen is soft.     Tenderness: There is no abdominal tenderness. There is no rebound.  Musculoskeletal:        General: No swelling. Normal range of motion.  Skin:    General: Skin is warm and dry.     Findings: No rash.  Neurological:     Mental Status: She is alert and oriented to person, place, and time.  Psychiatric:        Mood and Affect: Mood normal.        Behavior: Behavior normal.      GU / Detailed Urogynecologic Evaluation:  Pelvic Exam: Normal external female genitalia; Bartholin's and Skene's glands normal in appearance; urethral meatus normal in appearance, no urethral masses or discharge.   CST: negative  Complete prolapse noted. Normal vaginal mucosa with atrophy present. Cervix normal appearance. Uterus normal single, nontender. Adnexa no mass, fullness, tenderness.    Pelvic floor strength I/V  Pelvic floor musculature: Right levator non-tender, Right obturator non-tender, Left levator non-tender, Left obturator non-tender  POP-Q:   POP-Q  3                                            Aa   7                                           Ba  7                                              C   5.5                                            Gh  3.5  Pb  7                                            tvl   3                                            Ap  7                                             Bp  -1                                              D      Rectal Exam:  Normal external rectum  Post-Void Residual (PVR) by Bladder Scan: In order to evaluate bladder emptying, we discussed obtaining a postvoid residual and she agreed to this procedure.  Procedure: The ultrasound unit was placed on the patient's abdomen in the suprapubic region after the patient had voided. A PVR of 18 ml was obtained by bladder scan.  Laboratory Results: POC urine: small leukocytes   ASSESSMENT AND PLAN Ms. Causer is a 82 y.o. with:  1. Uterovaginal prolapse, complete   2. Prolapse of anterior vaginal wall   3. Prolapse of posterior vaginal wall   4. Overactive bladder   5. Urinary frequency   6. SUI (stress urinary incontinence, female)   7. Leukocytes in urine    Stage IV anterior, Stage IV posterior, Stage IV apical prolapse - For treatment of pelvic organ prolapse, we discussed options for management including expectant management, conservative management, and surgical management, such as Kegels, a pessary, pelvic floor physical therapy, and specific surgical procedures (colpocleisis). - She is not interested in any treatment currently. We discussed that she does not need treatment unless she develops issues with emptying her bladder or notices bleeding or other issues due to the prolapse dropping down. Will plan to expectantly manage.   2/. OAB - We discussed the symptoms of overactive bladder (OAB), which include urinary urgency, urinary frequency, nocturia, with or without urge incontinence.  While we do not know the exact etiology of OAB, several treatment options exist. We discussed management including behavioral therapy (decreasing bladder irritants, urge suppression strategies, timed voids, bladder retraining), physical therapy, medication; for refractory cases posterior tibial nerve stimulation, sacral neuromodulation, and intravesical botulinum toxin  injection.  - Discussed drinking more water and less coffee/ soda - Prescribed Myrbetriq '50mg'$  daily. For Beta-3 agonist medication, we discussed the potential side effect of elevated blood pressure which is more likely to occur in individuals with uncontrolled hypertension. Rx was written on orders for [facility] and sent with the patient- daughter in law is unsure if Rx needs to be sent to a pharmacy or if they will fill it at her facility. She will ask and let us know if we need to send somewhere.   3. SUI - Not as bothersome as OAB symptoms. Will defer treatment at this time.   4. Leukocytes in urine - will send urine for culture.  Return 6 weeks for follow up  Jaquita Folds, MD

## 2022-12-11 NOTE — Patient Instructions (Signed)

## 2022-12-13 LAB — URINE CULTURE: Culture: >=100000 — AB

## 2022-12-13 MED ORDER — NITROFURANTOIN MONOHYD MACRO 100 MG PO CAPS: 100.0000 mg | ORAL_CAPSULE | Freq: Two times a day (BID) | ORAL | 0 refills | Status: AC

## 2022-12-13 MED ORDER — ESTRADIOL 0.1 MG/GM VA CREA: TOPICAL_CREAM | VAGINAL | 11 refills | Status: DC

## 2022-12-13 NOTE — Progress Notes (Signed)
Attempted to contact patient. Kendra Charles

## 2022-12-13 NOTE — Addendum Note (Signed)
Addended by: Jaquita Folds on: 12/13/2022 11:15 AM   Modules accepted: Orders

## 2022-12-18 ENCOUNTER — Emergency Department (HOSPITAL_COMMUNITY): Payer: Medicare Other

## 2022-12-18 ENCOUNTER — Encounter (HOSPITAL_COMMUNITY): Payer: Self-pay | Admitting: Radiology

## 2022-12-18 ENCOUNTER — Emergency Department (HOSPITAL_COMMUNITY)
Admission: EM | Admit: 2022-12-18 | Discharge: 2022-12-18 | Disposition: A | Discharge status: Skilled Nursing Facility | Payer: Medicare Other | Attending: Emergency Medicine | Admitting: Emergency Medicine

## 2022-12-18 ENCOUNTER — Other Ambulatory Visit: Payer: Self-pay

## 2022-12-18 DIAGNOSIS — M545 Low back pain, unspecified: Secondary | ICD-10-CM | POA: Diagnosis not present

## 2022-12-18 DIAGNOSIS — Z7901 Long term (current) use of anticoagulants: Secondary | ICD-10-CM | POA: Diagnosis not present

## 2022-12-18 DIAGNOSIS — R627 Adult failure to thrive: Secondary | ICD-10-CM | POA: Diagnosis not present

## 2022-12-18 DIAGNOSIS — W19XXXA Unspecified fall, initial encounter: Secondary | ICD-10-CM | POA: Insufficient documentation

## 2022-12-18 DIAGNOSIS — I6782 Cerebral ischemia: Secondary | ICD-10-CM | POA: Diagnosis not present

## 2022-12-18 DIAGNOSIS — Z1152 Encounter for screening for COVID-19: Secondary | ICD-10-CM | POA: Diagnosis not present

## 2022-12-18 DIAGNOSIS — I10 Essential (primary) hypertension: Secondary | ICD-10-CM | POA: Insufficient documentation

## 2022-12-18 DIAGNOSIS — F0390 Unspecified dementia without behavioral disturbance: Secondary | ICD-10-CM | POA: Insufficient documentation

## 2022-12-18 DIAGNOSIS — N3000 Acute cystitis without hematuria: Secondary | ICD-10-CM | POA: Insufficient documentation

## 2022-12-18 DIAGNOSIS — I4891 Unspecified atrial fibrillation: Secondary | ICD-10-CM | POA: Insufficient documentation

## 2022-12-18 DIAGNOSIS — N813 Complete uterovaginal prolapse: Secondary | ICD-10-CM | POA: Diagnosis not present

## 2022-12-18 DIAGNOSIS — M4856XA Collapsed vertebra, not elsewhere classified, lumbar region, initial encounter for fracture: Secondary | ICD-10-CM | POA: Diagnosis not present

## 2022-12-18 DIAGNOSIS — Z79899 Other long term (current) drug therapy: Secondary | ICD-10-CM | POA: Diagnosis not present

## 2022-12-18 LAB — COMPREHENSIVE METABOLIC PANEL
ALT: 14 U/L (ref 0–44)
AST: 28 U/L (ref 15–41)
Albumin: 4.1 g/dL (ref 3.5–5.0)
Alkaline Phosphatase: 90 U/L (ref 38–126)
Anion gap: 10 (ref 5–15)
BUN: 11 mg/dL (ref 8–23)
CO2: 28 mmol/L (ref 22–32)
Calcium: 9.3 mg/dL (ref 8.9–10.3)
Chloride: 92 mmol/L — ABNORMAL LOW (ref 98–111)
Creatinine, Ser: 0.58 mg/dL (ref 0.44–1.00)
GFR, Estimated: >60 mL/min (ref >60)
Glucose, Bld: 120 mg/dL — ABNORMAL HIGH (ref 70–99)
Potassium: 3 mmol/L — ABNORMAL LOW (ref 3.5–5.1)
Sodium: 130 mmol/L — ABNORMAL LOW (ref 135–145)
Total Bilirubin: 1 mg/dL (ref 0.3–1.2)
Total Protein: 7.5 g/dL (ref 6.5–8.1)

## 2022-12-18 LAB — URINALYSIS, ROUTINE W REFLEX MICROSCOPIC
Bilirubin Urine: NEGATIVE
Glucose, UA: NEGATIVE mg/dL
Hgb urine dipstick: NEGATIVE
Ketones, ur: NEGATIVE mg/dL
Nitrite: NEGATIVE
Protein, ur: 100 mg/dL — AB
Specific Gravity, Urine: 1.018 (ref 1.005–1.030)
WBC, UA: >50 WBC/hpf (ref 0–5)
pH: 5 (ref 5.0–8.0)

## 2022-12-18 LAB — CBC WITH DIFFERENTIAL/PLATELET
Abs Immature Granulocytes: 0.06 10*3/uL (ref 0.00–0.07)
Basophils Absolute: 0 10*3/uL (ref 0.0–0.1)
Basophils Relative: 0 %
Eosinophils Absolute: 0 10*3/uL (ref 0.0–0.5)
Eosinophils Relative: 0 %
HCT: 41.3 % (ref 36.0–46.0)
Hemoglobin: 14 g/dL (ref 12.0–15.0)
Immature Granulocytes: 0 %
Lymphocytes Relative: 12 %
Lymphs Abs: 1.8 10*3/uL (ref 0.7–4.0)
MCH: 30.8 pg (ref 26.0–34.0)
MCHC: 33.9 g/dL (ref 30.0–36.0)
MCV: 90.8 fL (ref 80.0–100.0)
Monocytes Absolute: 1.4 10*3/uL — ABNORMAL HIGH (ref 0.1–1.0)
Monocytes Relative: 10 %
Neutro Abs: 11.8 10*3/uL — ABNORMAL HIGH (ref 1.7–7.7)
Neutrophils Relative %: 78 %
Platelets: 281 10*3/uL (ref 150–400)
RBC: 4.55 MIL/uL (ref 3.87–5.11)
RDW: 12.9 % (ref 11.5–15.5)
WBC: 15.1 10*3/uL — ABNORMAL HIGH (ref 4.0–10.5)
nRBC: 0 % (ref 0.0–0.2)

## 2022-12-18 LAB — RESP PANEL BY RT-PCR (RSV, FLU A&B, COVID)  RVPGX2
Influenza A by PCR: NEGATIVE
Influenza B by PCR: NEGATIVE
Resp Syncytial Virus by PCR: NEGATIVE
SARS Coronavirus 2 by RT PCR: NEGATIVE

## 2022-12-18 LAB — CBG MONITORING, ED: Glucose-Capillary: 124 mg/dL — ABNORMAL HIGH (ref 70–99)

## 2022-12-18 MED ORDER — CEPHALEXIN 500 MG PO CAPS
500.0000 mg | ORAL_CAPSULE | Freq: Once | ORAL | Status: AC
  Administered 2022-12-18: 500 mg via ORAL
  Filled 2022-12-18: qty 1

## 2022-12-18 MED ORDER — CEPHALEXIN 500 MG PO CAPS: 500.0000 mg | ORAL_CAPSULE | Freq: Three times a day (TID) | ORAL | 0 refills | Status: DC

## 2022-12-18 NOTE — ED Notes (Signed)
Patient transported to X-ray 

## 2022-12-18 NOTE — ED Provider Notes (Signed)
[place] EMERGENCY DEPARTMENT AT [facility] Provider Note   CSN: [id number] Arrival date & time: 12/18/22  1110     History  Chief Complaint  Patient presents with   Failure To Thrive   Fall    Kendra Charles is a 82 y.o. female.  Pt is a 82 yo female with pmhx significant for dementia, htn, hld, afib (on Eliquis) and PAD.  Pt is from [employer].  She fell yesterday on her bottom.  She has not wanted to eat/drink for 2-3 days.  She said she has pain in her lower back.  She denies h/a.  No f/c.       Home Medications Prior to Admission medications   Medication Sig Start Date End Date Taking? Authorizing Provider  cephALEXin (KEFLEX) 500 MG capsule Take 1 capsule (500 mg total) by mouth 3 (three) times daily. 12/18/22  Yes Isla Pence, MD  acetaminophen (TYLENOL) 325 MG tablet Take 650 mg by mouth 2 (two) times daily.    [provider]  acetaminophen (TYLENOL) 500 MG tablet Take 500 mg by mouth every 6 (six) hours as needed for mild pain or headache (fever 99.5-101).    [provider]  alum & mag hydroxide-simeth (MAALOX/MYLANTA) 200-200-20 MG/5 SUSP Apply 20 mLs topically 3 (three) times daily as needed (indigestion).    [provider]  amLODipine (NORVASC) 5 MG tablet Take 5 mg by mouth daily.    [provider]  apixaban (ELIQUIS) 5 MG TABS tablet Take 1 tablet (5 mg total) by mouth 2 (two) times daily. 06/23/19   Earlene Plater, MD  diclofenac Sodium (VOLTAREN) 1 % GEL Apply 4 g topically 3 (three) times daily.    [provider]  donepezil (ARICEPT) 5 MG tablet Take 5 mg by mouth at bedtime.    [provider]  doxycycline (VIBRA-TABS) 100 MG tablet Take 100 mg by mouth 2 (two) times daily. 08/08/22   [provider]  estradiol (ESTRACE) 0.1 MG/GM vaginal cream Place 0.5g in vagina twice a week 12/13/22   Jaquita Folds, MD  guaifenesin (ROBITUSSIN) 100 MG/5ML syrup Take 200 mg by mouth  every 6 (six) hours as needed for cough.    [provider]  levETIRAcetam (KEPPRA) 500 MG tablet Take 1 tablet (500 mg total) by mouth 2 (two) times daily. 06/25/19   Earlene Plater, MD  lisinopril (ZESTRIL) 40 MG tablet Take 40 mg by mouth daily.    [provider]  loperamide (IMODIUM) 2 MG capsule Take 2 mg by mouth daily as needed for diarrhea or loose stools.    [provider]  loratadine (CLARITIN) 10 MG tablet Take 10 mg by mouth daily.    [provider]  LORazepam (ATIVAN) 0.5 MG tablet Take 0.5 mg by mouth 2 (two) times daily.    [provider]  lovastatin (MEVACOR) 40 MG tablet Take 40 mg by mouth at bedtime.  06/17/19   [provider]  magnesium hydroxide (MILK OF MAGNESIA) 400 MG/5ML suspension Take 30 mLs by mouth daily as needed for mild constipation.    [provider]  Melatonin-Theanine 10-5.5 MG TABS Take 1 tablet by mouth at bedtime.    [provider]  metoprolol tartrate (LOPRESSOR) 50 MG tablet Take 50 mg by mouth 2 (two) times daily.    [provider]  mirabegron ER (MYRBETRIQ) 50 MG TB24 tablet Take 1 tablet (50 mg total) by mouth daily. 12/11/22   Jaquita Folds,  MD  Multiple Vitamins-Minerals (MULTIVITAMIN WITH MINERALS) tablet Take 1 tablet by mouth daily.    [provider]  nitrofurantoin, macrocrystal-monohydrate, (MACROBID) 100 MG capsule Take 1 capsule (100 mg total) by mouth 2 (two) times daily for 5 days. 12/13/22 12/18/22  Jaquita Folds, MD  ondansetron (ZOFRAN-ODT) 4 MG disintegrating tablet Take 4 mg by mouth every 6 (six) hours as needed for nausea or vomiting.    [provider]  sodium chloride 1 g tablet Take 1 g by mouth every other day.    [provider]  traMADol (ULTRAM) 50 MG tablet Take 25 mg by mouth 2 (two) times daily. For knee pain    [provider]  traZODone (DESYREL) 50 MG tablet Take 50 mg by mouth at bedtime.     [provider]      Allergies    Patient has no known allergies.    Review of Systems   Review of Systems  Musculoskeletal:  Positive for back pain.  All other systems reviewed and are negative.   Physical Exam Updated Vital Signs BP (!) 147/101 (BP Location: Right Arm)   Pulse 84   Temp 99.2 F (37.3 C) (Oral)   Resp (!) 22   Ht '5\' 4"'$  (1.626 m)   Wt 52 kg   SpO2 92%   BMI 19.68 kg/m  Physical Exam Vitals and nursing note reviewed. Exam conducted with a chaperone present.  Constitutional:      Appearance: Normal appearance.  HENT:     Head: Normocephalic and atraumatic.     Right Ear: External ear normal.     Left Ear: External ear normal.     Nose: Nose normal.     Mouth/Throat:     Mouth: Mucous membranes are dry.  Eyes:     Extraocular Movements: Extraocular movements intact.     Conjunctiva/sclera: Conjunctivae normal.     Pupils: Pupils are equal, round, and reactive to light.  Cardiovascular:     Rate and Rhythm: Normal rate. Rhythm irregular.     Pulses: Normal pulses.     Heart sounds: Normal heart sounds.  Pulmonary:     Effort: Pulmonary effort is normal.     Breath sounds: Normal breath sounds.  Abdominal:     General: Abdomen is flat. Bowel sounds are normal.     Palpations: Abdomen is soft.  Genitourinary:    Comments: Complete uterine prolapse Musculoskeletal:        General: Normal range of motion.     Cervical back: Normal range of motion and neck supple.  Skin:    General: Skin is warm.     Capillary Refill: Capillary refill takes less than 2 seconds.  Neurological:     General: No focal deficit present.     Mental Status: She is alert. Mental status is at baseline.  Psychiatric:        Mood and Affect: Mood normal.        Behavior: Behavior normal.     ED Results / Procedures / Treatments   Labs (all labs ordered are listed, but only abnormal results are displayed) Labs Reviewed  CBC WITH DIFFERENTIAL/PLATELET -  Abnormal; Notable for the following components:      Result Value   WBC 15.1 (*)    Neutro Abs 11.8 (*)    Monocytes Absolute 1.4 (*)    All other components within normal limits  COMPREHENSIVE METABOLIC PANEL - Abnormal; Notable for the following components:  Sodium 130 (*)    Potassium 3.0 (*)    Chloride 92 (*)    Glucose, Bld 120 (*)    All other components within normal limits  URINALYSIS, ROUTINE W REFLEX MICROSCOPIC - Abnormal; Notable for the following components:   Color, Urine AMBER (*)    APPearance HAZY (*)    Protein, ur 100 (*)    Leukocytes,Ua SMALL (*)    Bacteria, UA RARE (*)    All other components within normal limits  CBG MONITORING, ED - Abnormal; Notable for the following components:   Glucose-Capillary 124 (*)    All other components within normal limits  RESP PANEL BY RT-PCR (RSV, FLU A&B, COVID)  [id number]    EKG EKG Interpretation  Date/Time:  Tuesday December 18 2022 11:24:48 EST Ventricular Rate:  77 PR Interval:    QRS Duration: 100 QT Interval:  441 QTC Calculation: 500 R Axis:   49 Text Interpretation: Atrial fibrillation Abnormal T, consider ischemia, anterior leads No significant change since last tracing Confirmed by Isla Pence [phone]) on 12/18/2022 1:39:34 PM  Radiology CT Head Wo Contrast  Result Date: 12/18/2022 CLINICAL DATA:  Fall EXAM: CT HEAD WITHOUT CONTRAST TECHNIQUE: Contiguous axial images were obtained from the base of the skull through the vertex without intravenous contrast. RADIATION DOSE REDUCTION: This exam was performed according to the departmental dose-optimization program which includes automated exposure control, adjustment of the mA and/or kV according to patient size and/or use of iterative reconstruction technique. COMPARISON:  CT head 08/11/2022 FINDINGS: Brain: No evidence of hemorrhage or extra-axial fluid collection. There is sequela of severe chronic microvascular ischemic change with multiple chronic infarcts  including the right occipital lobe, left thalamus, in the right caudate head. There is advanced generalized volume loss. Unchanged size and shape of the ventricular system. Vascular: No hyperdense vessel or unexpected calcification. Skull: Normal. Negative for fracture or focal lesion. Sinuses/Orbits: No mastoid or middle ear effusion. Paranasal sinuses are clear. Orbits are unremarkable. Other: None. IMPRESSION: No CT evidence of intracranial injury. 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 Electronically Signed   By: Marin Roberts M.D.   On: 12/18/2022 14:59   DG Lumbar Spine Complete  Result Date: 12/18/2022 CLINICAL DATA:  Fall EXAM: LUMBAR SPINE - COMPLETE 5 VIEW COMPARISON:  None Available. FINDINGS: Osseous structures are severely osteopenic. Mild compression deformities that are age-indeterminate L1-L3. Disc spaces appear relatively well-maintained. Facet joint degenerative changes identified at L4-5 and L5-S1. No focal osteolytic or osteoblastic lesions. Dense atheromatous changes of the aorta and its branches noted. IMPRESSION: Age-indeterminate upper lumbar compression deformities and severe osteopenia. Electronically Signed   By: Sammie Bench M.D.   On: 12/18/2022 12:44   DG Sacrum/Coccyx  Result Date: 12/18/2022 CLINICAL DATA:  Pain after fall EXAM: SACRUM AND COCCYX - 3 VIEW COMPARISON:  X-ray 03/22/2022 of the pelvis FINDINGS: Severe osteopenia. No obvious fracture or dislocation although evaluation for injury is limited by the level of osteopenia. Chondrocalcinosis along the pubic symphysis. Joint space loss of the hip joints. If there is further concern of injury additional cross-sectional imaging study can be performed as clinically indicated. Prominent vascular calcifications. Degenerative changes also seen of the right sacroiliac joint. IMPRESSION: Severe osteopenia with degenerative changes. No obvious fracture but evaluation limited by  this level of osteopenia. Electronically Signed   By: Jill Side M.D.   On: 12/18/2022 12:43   DG Chest 2 View  Result Date: 12/18/2022 CLINICAL DATA:  Pain after  fall EXAM: CHEST - 2 VIEW COMPARISON:  X-ray 08/11/2022 FINDINGS: Hyperinflation. No consolidation, pneumothorax or effusion. No edema. Enlarged cardiopericardial silhouette with calcified and tortuous aorta. Film is rotated to the right. Old left clavicle fracture. Osteopenia. Overlapping cardiac leads. Degenerative changes of the spine on lateral view. IMPRESSION: Hyperinflation. Enlarged cardiopericardial silhouette. Calcified aorta. No pneumothorax. Osteopenia.  Old left clavicle fracture. Electronically Signed   By: Jill Side M.D.   On: 12/18/2022 12:41    Procedures Procedures    Medications Ordered in ED Medications  cephALEXin (KEFLEX) capsule 500 mg (has no administration in time range)    ED Course/ Medical Decision Making/ A&P                             Medical Decision Making Amount and/or Complexity of Data Reviewed Labs: ordered. Radiology: ordered.   This patient presents to the ED for concern of ams, this involves an extensive number of treatment options, and is a complaint that carries with it a high risk of complications and morbidity.  The differential diagnosis includes fracture, head injury, electrolyte abn, infection   Co morbidities that complicate the patient evaluation  dementia, htn, hld, afib (on Eliquis) and PAD   Additional history obtained:  Additional history obtained from epic chart review External records from outside source obtained and reviewed including EMS report   Lab Tests:  I Ordered, and personally interpreted labs.  The pertinent results include:  cbc with wbc elevated at 15.1, cmp with na low at 130 and k low at 3, covid/flu/rsv neg; ua with small leuks and >50 wbcs   Imaging Studies ordered:  I ordered imaging studies including cxr, lumbar, and coccyx x-rays, ct  head  I independently visualized and interpreted imaging which showed  CXR: Hyperinflation. Enlarged cardiopericardial silhouette. Calcified  aorta. No pneumothorax.    Osteopenia.  Old left clavicle fracture.  Coccyx: Severe osteopenia with degenerative changes. No obvious fracture but  evaluation limited by this level of osteopenia.  Lumbar: Age-indeterminate upper lumbar compression deformities and severe  osteopenia.  CT head:  I agree with the radiologist interpretation   Cardiac Monitoring:  The patient was maintained on a cardiac monitor.  I personally viewed and interpreted the cardiac monitored which showed an underlying rhythm of: afib   Medicines ordered and prescription drug management:  I ordered medication including keflex  for uti  Reevaluation of the patient after these medicines showed that the patient improved I have reviewed the patients home medicines and have made adjustments as needed   Test Considered:  ct   Critical Interventions:  abx   Problem List / ED Course:  UTI:  pt started on keflex FTT:  the nurse was able to get pt to eat a half a sandwich, ice cream, and lemonade.  Fall:  no fx.  I called pt's son, but he did not answer phone.   Reevaluation:  After the interventions noted above, I reevaluated the patient and found that they have :improved   Social Determinants of Health:  Lives at memory care   Dispostion:  After consideration of the diagnostic results and the patients response to treatment, I feel that the patent would benefit from discharge with outpatient f/u.          Final Clinical Impression(s) / ED Diagnoses Final diagnoses:  Complete uterovaginal prolapse  Acute cystitis without hematuria    Rx / DC Orders ED Discharge Orders  Ordered    cephALEXin (KEFLEX) 500 MG capsule  3 times daily        12/18/22 1548              Isla Pence, MD 12/18/22 1551

## 2022-12-18 NOTE — ED Triage Notes (Signed)
Patient brought in by EMS from [facility] following a fall and failure to thrive. Per EMS patient fell on 3/5 causing pain to coccyx area and legs, pain increases when she sits up, patient on thinners. Facility staff states patient has not eaten for 2-3 days. She has HX of dementia and is currently at baseline.  120/80 72 92-94% RA CBG: 194

## 2022-12-18 NOTE — ED Notes (Signed)
RN called and gave report to nurse Marita Kansas at [facility]

## 2022-12-18 NOTE — ED Notes (Signed)
[facility] called to have patient transported

## 2022-12-20 ENCOUNTER — Telehealth: Payer: Self-pay

## 2022-12-20 NOTE — Telephone Encounter (Signed)
-----   Message from Jaquita Folds, MD sent at 12/13/2022 11:09 AM EST ----- Patient has a urinary trace infection. She does not have a pharmacy on file- I believe medications are sent to her assisted living faciltiy- can you confirm this? She needs macrobid '100mg'$  BID x 5 days. I also want to send estrace cream 0.5g to place vaginally two times per week. Thanks

## 2022-12-20 NOTE — Telephone Encounter (Signed)
I called and confirmed the patient's nurse received the order for medication. The nurse stated she has started the medication as directed.

## 2022-12-26 ENCOUNTER — Emergency Department (HOSPITAL_COMMUNITY): Payer: Medicare Other

## 2022-12-26 ENCOUNTER — Other Ambulatory Visit: Payer: Self-pay

## 2022-12-26 ENCOUNTER — Emergency Department (HOSPITAL_COMMUNITY)
Admission: EM | Admit: 2022-12-26 | Discharge: 2022-12-26 | Disposition: A | Discharge status: Home / Self Care | Payer: Medicare Other | Attending: Emergency Medicine | Admitting: Emergency Medicine

## 2022-12-26 DIAGNOSIS — E876 Hypokalemia: Secondary | ICD-10-CM | POA: Insufficient documentation

## 2022-12-26 DIAGNOSIS — Z1152 Encounter for screening for COVID-19: Secondary | ICD-10-CM | POA: Diagnosis not present

## 2022-12-26 DIAGNOSIS — I10 Essential (primary) hypertension: Secondary | ICD-10-CM | POA: Insufficient documentation

## 2022-12-26 DIAGNOSIS — R4182 Altered mental status, unspecified: Secondary | ICD-10-CM | POA: Diagnosis not present

## 2022-12-26 DIAGNOSIS — S3992XA Unspecified injury of lower back, initial encounter: Secondary | ICD-10-CM | POA: Diagnosis present

## 2022-12-26 DIAGNOSIS — F0390 Unspecified dementia without behavioral disturbance: Secondary | ICD-10-CM | POA: Diagnosis not present

## 2022-12-26 DIAGNOSIS — X58XXXA Exposure to other specified factors, initial encounter: Secondary | ICD-10-CM | POA: Diagnosis not present

## 2022-12-26 DIAGNOSIS — S32010A Wedge compression fracture of first lumbar vertebra, initial encounter for closed fracture: Secondary | ICD-10-CM | POA: Insufficient documentation

## 2022-12-26 DIAGNOSIS — Z79899 Other long term (current) drug therapy: Secondary | ICD-10-CM | POA: Diagnosis not present

## 2022-12-26 DIAGNOSIS — Z7901 Long term (current) use of anticoagulants: Secondary | ICD-10-CM | POA: Diagnosis not present

## 2022-12-26 DIAGNOSIS — R531 Weakness: Secondary | ICD-10-CM | POA: Insufficient documentation

## 2022-12-26 DIAGNOSIS — Z20822 Contact with and (suspected) exposure to covid-19: Secondary | ICD-10-CM | POA: Insufficient documentation

## 2022-12-26 LAB — CBC
HCT: 39.1 % (ref 36.0–46.0)
Hemoglobin: 13 g/dL (ref 12.0–15.0)
MCH: 30.4 pg (ref 26.0–34.0)
MCHC: 33.2 g/dL (ref 30.0–36.0)
MCV: 91.4 fL (ref 80.0–100.0)
Platelets: 420 10*3/uL — ABNORMAL HIGH (ref 150–400)
RBC: 4.28 MIL/uL (ref 3.87–5.11)
RDW: 12.8 % (ref 11.5–15.5)
WBC: 10.6 10*3/uL — ABNORMAL HIGH (ref 4.0–10.5)
nRBC: 0 % (ref 0.0–0.2)

## 2022-12-26 LAB — COMPREHENSIVE METABOLIC PANEL
ALT: 11 U/L (ref 0–44)
AST: 19 U/L (ref 15–41)
Albumin: 4 g/dL (ref 3.5–5.0)
Alkaline Phosphatase: 77 U/L (ref 38–126)
Anion gap: 11 (ref 5–15)
BUN: 20 mg/dL (ref 8–23)
CO2: 28 mmol/L (ref 22–32)
Calcium: 8.9 mg/dL (ref 8.9–10.3)
Chloride: 93 mmol/L — ABNORMAL LOW (ref 98–111)
Creatinine, Ser: 0.78 mg/dL (ref 0.44–1.00)
GFR, Estimated: >60 mL/min (ref >60)
Glucose, Bld: 103 mg/dL — ABNORMAL HIGH (ref 70–99)
Potassium: 2.9 mmol/L — ABNORMAL LOW (ref 3.5–5.1)
Sodium: 132 mmol/L — ABNORMAL LOW (ref 135–145)
Total Bilirubin: 0.5 mg/dL (ref 0.3–1.2)
Total Protein: 6.7 g/dL (ref 6.5–8.1)

## 2022-12-26 LAB — URINALYSIS, ROUTINE W REFLEX MICROSCOPIC
Bilirubin Urine: NEGATIVE
Glucose, UA: NEGATIVE mg/dL
Hgb urine dipstick: NEGATIVE
Ketones, ur: 5 mg/dL — AB
Leukocytes,Ua: NEGATIVE
Nitrite: NEGATIVE
Protein, ur: NEGATIVE mg/dL
Specific Gravity, Urine: 1.005 (ref 1.005–1.030)
pH: 6 (ref 5.0–8.0)

## 2022-12-26 LAB — RESP PANEL BY RT-PCR (RSV, FLU A&B, COVID)  RVPGX2
Influenza A by PCR: NEGATIVE
Influenza B by PCR: NEGATIVE
Resp Syncytial Virus by PCR: NEGATIVE
SARS Coronavirus 2 by RT PCR: NEGATIVE

## 2022-12-26 MED ORDER — POTASSIUM CHLORIDE 10 MEQ/100ML IV SOLN
10.0000 meq | INTRAVENOUS | Status: AC
  Administered 2022-12-26: 10 meq via INTRAVENOUS
  Filled 2022-12-26 (×2): qty 100

## 2022-12-26 MED ORDER — SODIUM CHLORIDE 0.9 % IV BOLUS
1000.0000 mL | Freq: Once | INTRAVENOUS | Status: AC
  Administered 2022-12-26: 1000 mL via INTRAVENOUS

## 2022-12-26 MED ORDER — POTASSIUM CHLORIDE CRYS ER 20 MEQ PO TBCR: 40.0000 meq | EXTENDED_RELEASE_TABLET | Freq: Once | ORAL | Status: DC

## 2022-12-26 MED ORDER — POTASSIUM CHLORIDE ER 10 MEQ PO TBCR: 10.0000 meq | EXTENDED_RELEASE_TABLET | Freq: Every day | ORAL | 0 refills | Status: DC

## 2022-12-26 MED ORDER — POTASSIUM CHLORIDE 20 MEQ PO PACK
40.0000 meq | PACK | Freq: Once | ORAL | Status: AC
  Administered 2022-12-26: 40 meq via ORAL
  Filled 2022-12-26: qty 2

## 2022-12-26 NOTE — ED Provider Notes (Addendum)
Patient initially evaluated by Dr. Langston Masker and signed out to me at 1700 pending UA and viral swab as well as a T SLO brace.  In short this is an 82 year old female with a past medical history of dementia that presented to the emergency department from her nursing home with concern for increased weakness.  She was seen in the ED 1 to 2 weeks ago and did have a age-indeterminate lumbar compression fracture and was also treated with a UTI however had no urine culture sent and is unclear if the UTI has resolved.  The patient's labs here showed signs of dehydration with mild hyponatremia and hypokalemia which have been repleted.  She had a CT scan that did confirm her lumbar compression fractures and we will attempt to fit her for T SLO brace if she is able to tolerate this.  Urine and viral swab are pending at this time.  Upon reassessment, patient's urine and viral swab are negative. Patient is stable for discharge back to her facility with outpatient follow up.    Kemper Durie, DO 12/26/22 1902

## 2022-12-26 NOTE — ED Notes (Addendum)
Patient was wet. Gown and linens changed. Depend placed on.

## 2022-12-26 NOTE — ED Provider Triage Note (Addendum)
Emergency Medicine Provider Triage Evaluation Note  Kathrin Sandrock , a 82 y.o. female  was evaluated in triage.  Pt complains of AMS. Pt is not a particularly good historian. Hx of afib on eliquis.   Pt BIB EMS from [facility]. Recent UTI and hx of UTI and seemingly was tx.  She was thought to be weak/altered 2/2 this however no improvement. Seems she was started on abx the 5th.   Hx of hyponatremia/kalemia and metabolic encephalopathy d/t UTI.    Review of Systems  Positive: NA Negative: NA  Physical Exam  BP (!) 113/90 (BP Location: Right Arm)   Pulse 73   Temp 97.7 F (36.5 C) (Oral)   Resp 13   SpO2 94%  Gen:   Awake, no distress   Resp:  Normal effort  MSK:   Moves extremities without difficulty  Other:  Moves extremities. In no distress. Abd soft NTTP.   Medical Decision Making  Medically screening exam initiated at 1:29 PM.  Appropriate orders placed.  Deonka Donohoe was informed that the remainder of the evaluation will be completed by another provider, this initial triage assessment does not replace that evaluation, and the importance of remaining in the ED until their evaluation is complete.  Labs, CT head, urine   Tedd Sias, [place] 12/26/22 1331    Pati Gallo [place], [place] 12/26/22 1333

## 2022-12-26 NOTE — ED Triage Notes (Signed)
Pt BIBA from [facility] assisted living. Pt dx of UTI on 5th, given abx, no improvement in mental status per staff. Pt normally Aox4 and mobile, but Aox2 and struggling with ambulation.  Pt has hx of Afib  20 in LAC  BP: 115/77 HR: 83 SPO2: 96 RA CBG: 122

## 2022-12-26 NOTE — Discharge Instructions (Addendum)
Update -   Kendra Charles's workup in the emergency room today showed that her potassium level has dropped to 2.9.  She was given IV potassium in the ER, and prescribed oral potassium to continue as a supplement at home.  This is likely due to her poor intake.  She otherwise does not show signs of kidney injury or severe dehydration.  She was given IV fluids in the ER.  CT scan of her head did not show any new injury or signs of stroke.  A CT scan of her lumbar spine today did show that she has an L1 burst fracture, approximately 45% compression fracture, which is likely from her fall 1 to 2 weeks ago.  This does not require emergency surgery.  It would be recommended that she wear the TLSO brace that was given in the emergency department today.  This brace may help with her pain.  However, given her dementia, there may be compliance issues with having her wear the brace.  Please be aware that she will likely continue having pain from her compression fracture in her back, and may need further pain medications for this.  She can follow-up with the neurosurgery clinic in 3 to 4 weeks for her compression fracture.  I was not able to reach her son Rochel Brome or her daughter Ernst Spell by phone in the ER today to provide a family update.

## 2022-12-26 NOTE — ED Notes (Signed)
Pt confused in conversation, not sure why she is here and would like something to eat.

## 2022-12-26 NOTE — ED Notes (Signed)
Ortho notified and writer called [phone] and placed order for TLSO brace

## 2022-12-26 NOTE — ED Provider Notes (Signed)
[place] [facility] Provider Note   CSN: [id number] Arrival date & time: 12/26/22  1306     History  Chief Complaint  Patient presents with   Altered Mental Status    Kendra Charles is a 82 y.o. female w/ hx of A Fib on eliquis, dementia, HTN, recurring UTI's, presenting from [facility], with complaint of some weakness.  [facility] resident coordinator reports to me that since the patient return to their facility after a visit to the ER approximately 1 to 2 weeks ago, diagnosed with a UTI at that time, she has had refusal to ambulate is much as she normally does, and also seems to be eating and drinking less.  The facility doctor wanted her to come back to the ER to have blood work rechecked and potentially for some IV fluids.  The coordinator confirms that the patient did complete a 5-day course of Macrobid for UTI, which based on her prior urine culture results in February, would be appropriate for multidrug-resistant E. coli at that time.  The coordinator was not aware that the patient was diagnosed with "age-indeterminate lumbar fractures" from her x-rays in the ED on 5 March.  These are L1-L3, mild compression deformities.  The patient herself reports she is hungry and repeatedly asking for food and drink on her arrival.  She is upset because she missed breakfast this morning.  HPI     Home Medications Prior to Admission medications   Medication Sig Start Date End Date Taking? Authorizing Provider  potassium chloride (KLOR-CON) 10 MEQ tablet Take 1 tablet (10 mEq total) by mouth daily for 20 days. 12/26/22 01/15/23 Yes Radiah Lubinski, Carola Rhine, MD  acetaminophen (TYLENOL) 325 MG tablet Take 650 mg by mouth 2 (two) times daily.    [provider]  acetaminophen (TYLENOL) 500 MG tablet Take 500 mg by mouth every 6 (six) hours as needed for mild pain or headache (fever 99.5-101).    [provider]  alum & mag hydroxide-simeth  (MAALOX/MYLANTA) 200-200-20 MG/5 SUSP Apply 20 mLs topically 3 (three) times daily as needed (indigestion).    [provider]  amLODipine (NORVASC) 5 MG tablet Take 5 mg by mouth daily.    [provider]  apixaban (ELIQUIS) 5 MG TABS tablet Take 1 tablet (5 mg total) by mouth 2 (two) times daily. 06/23/19   Earlene Plater, MD  cephALEXin (KEFLEX) 500 MG capsule Take 1 capsule (500 mg total) by mouth 3 (three) times daily. 12/18/22   Isla Pence, MD  diclofenac Sodium (VOLTAREN) 1 % GEL Apply 4 g topically 3 (three) times daily.    [provider]  donepezil (ARICEPT) 5 MG tablet Take 5 mg by mouth at bedtime.    [provider]  doxycycline (VIBRA-TABS) 100 MG tablet Take 100 mg by mouth 2 (two) times daily. 08/08/22   [provider]  estradiol (ESTRACE) 0.1 MG/GM vaginal cream Place 0.5g in vagina twice a week 12/13/22   Jaquita Folds, MD  guaifenesin (ROBITUSSIN) 100 MG/5ML syrup Take 200 mg by mouth every 6 (six) hours as needed for cough.    [provider]  levETIRAcetam (KEPPRA) 500 MG tablet Take 1 tablet (500 mg total) by mouth 2 (two) times daily. 06/25/19   Earlene Plater, MD  lisinopril (ZESTRIL) 40 MG tablet Take 40 mg by mouth daily.    [provider]  loperamide (IMODIUM) 2 MG capsule Take 2 mg by mouth daily as needed for diarrhea  or loose stools.    [provider]  loratadine (CLARITIN) 10 MG tablet Take 10 mg by mouth daily.    [provider]  LORazepam (ATIVAN) 0.5 MG tablet Take 0.5 mg by mouth 2 (two) times daily.    [provider]  lovastatin (MEVACOR) 40 MG tablet Take 40 mg by mouth at bedtime.  06/17/19   [provider]  magnesium hydroxide (MILK OF MAGNESIA) 400 MG/5ML suspension Take 30 mLs by mouth daily as needed for mild constipation.    [provider]  Melatonin-Theanine 10-5.5 MG TABS Take 1 tablet by mouth at bedtime.    [provider]  metoprolol tartrate (LOPRESSOR) 50 MG tablet Take 50 mg by mouth 2 (two) times daily.    [provider]  mirabegron ER (MYRBETRIQ) 50 MG TB24 tablet Take 1 tablet (50 mg total) by mouth daily. 12/11/22   Jaquita Folds, MD  Multiple Vitamins-Minerals (MULTIVITAMIN WITH MINERALS) tablet Take 1 tablet by mouth daily.    [provider]  ondansetron (ZOFRAN-ODT) 4 MG disintegrating tablet Take 4 mg by mouth every 6 (six) hours as needed for nausea or vomiting.    [provider]  sodium chloride 1 g tablet Take 1 g by mouth every other day.    [provider]  traMADol (ULTRAM) 50 MG tablet Take 25 mg by mouth 2 (two) times daily. For knee pain    [provider]  traZODone (DESYREL) 50 MG tablet Take 50 mg by mouth at bedtime.    [provider]      Allergies    Patient has no known allergies.    Review of Systems   Review of Systems  Physical Exam Updated Vital Signs BP (!) 148/103   Pulse 73   Temp 97.7 F (36.5 C) (Oral)   Resp 20   SpO2 100%  Physical Exam Constitutional:      General: She is not in acute distress. HENT:     Head: Normocephalic and atraumatic.  Eyes:     Conjunctiva/sclera: Conjunctivae normal.     Pupils: Pupils are equal, round, and reactive to light.  Cardiovascular:     Rate and Rhythm: Normal rate and regular rhythm.  Pulmonary:     Effort: Pulmonary effort is normal. No respiratory distress.  Abdominal:     General: There is no distension.     Tenderness: There is no abdominal tenderness.  Skin:    General: Skin is warm and dry.  Neurological:     General: No focal deficit present.     Mental Status: She is alert. Mental status is at baseline.     ED Results / Procedures / Treatments   Labs (all labs ordered are listed, but only abnormal results are displayed) Labs Reviewed  CBC - Abnormal; Notable for the following components:      Result Value   WBC 10.6 (*)    Platelets  420 (*)    All other components within normal limits  COMPREHENSIVE METABOLIC PANEL - Abnormal; Notable for the following components:   Sodium 132 (*)    Potassium 2.9 (*)    Chloride 93 (*)    Glucose, Bld 103 (*)    All other components within normal limits  RESP PANEL BY RT-PCR (RSV, FLU A&B, COVID)  [id number]  URINALYSIS, ROUTINE W REFLEX MICROSCOPIC    EKG None  Radiology CT HEAD WO CONTRAST (5MM)  Result Date: 12/26/2022 CLINICAL DATA:  [place] EXAM:  CT HEAD WITHOUT CONTRAST TECHNIQUE: Contiguous axial images were obtained from the base of the skull through the vertex without intravenous contrast. RADIATION DOSE REDUCTION: This exam was performed according to the departmental dose-optimization program which includes automated exposure control, adjustment of the mA and/or kV according to patient size and/or use of iterative reconstruction technique. COMPARISON:  CT Head 08/11/22 FINDINGS: Brain: Sequela of severe chronic microvascular ischemic change. Advanced generalized volume loss. 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 There is no CT evidence of an acute infarct. No extra-axial fluid collection. No hemorrhage. Unchanged size and shape of the ventricular system. Vascular: No hyperdense vessel or unexpected calcification. Skull: Normal. Negative for fracture or focal lesion. Sinuses/Orbits: No middle ear or mastoid effusion. Paranasal sinuses are clear. Orbits are unremarkable. Other: None. IMPRESSION: No acute intracranial abnormality. Electronically Signed   By: Marin Roberts M.D.   On: 12/26/2022 16:47   CT Lumbar Spine Wo Contrast  Result Date: 12/26/2022 CLINICAL DATA:  Patient fell approximately 1 week ago. Persistent back pain. Age indeterminate lumbar spine compression deformities on radiographs. EXAM: CT LUMBAR SPINE WITHOUT CONTRAST TECHNIQUE: Multidetector CT imaging of the lumbar spine was performed without intravenous contrast administration.  Multiplanar CT image reconstructions were also generated. RADIATION DOSE REDUCTION: This exam was performed according to the departmental dose-optimization program which includes automated exposure control, adjustment of the mA and/or kV according to patient size and/or use of iterative reconstruction technique. COMPARISON:  Lumbar spine radiographs 12/18/2022. CT of the chest, abdomen and pelvis 09/04/2019. FINDINGS: Segmentation: There are 5 lumbar type vertebral bodies. Alignment: Chronic minimal degenerative anterolisthesis at L5-S1 appears stable. Otherwise normal. Vertebrae: The bones are severely demineralized. There is an acute/subacute burst fracture at L1 resulting in approximately 45% loss of vertebral body height and 5 mm of osseous retropulsion into the spinal canal. The posterior elements are intact. No other acute fractures are identified. There is chronic Schmorl's node formation involving the superior endplate of [id number]. Paraspinal and other soft tissues: At the level of the L1 fracture, there is mild paraspinal edema/hemorrhage. No significant epidural fluid collection identified. There is extensive aortic and branch vessel atherosclerosis. Emphysematous changes are present at both lung bases with associated central airway thickening and patchy bibasilar atelectasis or scarring, similar to previous CT. Disc levels: Multilevel lumbar spondylosis is mild for age and stable compared with previous abdominal CT. There is no evidence of focal disc herniation, significant spinal stenosis or nerve root encroachment. Facet degenerative changes are present inferiorly without resulting significant foraminal narrowing. As noted above, there is osseous retropulsion at the L1 fracture which does exert some mass effect on the thecal sac. IMPRESSION: 1. Acute/subacute burst fracture at L1 resulting in approximately 45% loss of vertebral body height and 5 mm of osseous retropulsion into the spinal canal. 2. No other  acute osseous findings identified. 3. Stable mild multilevel lumbar spondylosis. 4.  Aortic Atherosclerosis (ICD10-I70.0). Electronically Signed   By: Richardean Sale M.D.   On: 12/26/2022 16:42   DG Chest Portable 1 View  Result Date: 12/26/2022 CLINICAL DATA:  82 y.o. female was evaluated in triage. Pt complains of AMS. Pt is not a particularly good historian. Hx of afib on eliquis. Pt BIB EMS from [place]. Recent UTI and hx of UTI and seemingly was tx. EXAM: PORTABLE CHEST - 1 VIEW COMPARISON:  12/18/2022 FINDINGS: Lungs are clear. Heart size upper limits normal. Aortic Atherosclerosis (ICD10-170.0). No effusion. Visualized bones unremarkable. IMPRESSION: No acute cardiopulmonary  disease. Electronically Signed   By: Lucrezia Europe M.D.   On: 12/26/2022 14:06    Procedures Procedures    Medications Ordered in ED Medications  potassium chloride 10 mEq in 100 mL IVPB (10 mEq Intravenous New Bag/Given 12/26/22 1519)  potassium chloride SA (KLOR-CON M) CR tablet 40 mEq (has no administration in time range)  sodium chloride 0.9 % bolus 1,000 mL (1,000 mLs Intravenous New Bag/Given 12/26/22 1519)    ED Course/ Medical Decision Making/ A&P Clinical Course as of 12/26/22 1729  Wed Dec 26, 2022  1653 No answer from son [name] or daughter Sharyn Lull per care everywhere [MT]    Clinical Course User Index [MT] Langston Masker, Carola Rhine, MD                             Medical Decision Making Amount and/or Complexity of Data Reviewed Radiology: ordered.  Risk Prescription drug management.   This patient presents to the ED with concern for generalized weakness. This involves an extensive number of treatment options, and is a complaint that carries with it a high risk of complications and morbidity.  The differential diagnosis includes recurring UTI versus dementia related dietary intake versus dehydration versus metabolic derangement versus other  Co-morbidities that complicate the patient evaluation:  Patient is on anticoagulation and a higher fall risk.  She is level 5 caveat due to dementia  Additional history obtained from EMS and resident coordinator at [facility]  External records from outside source obtained and reviewed including workup from the ER on March 5  I ordered and personally interpreted labs.  The pertinent results include: Hypokalemia likely related to poor oral intake.  No AKI.  No acute anemia.  UA and covid/flu pending at the time of signout  I ordered imaging studies including CT head, CT lumbar spine, x-ray of the chest I independently visualized and interpreted imaging which showed no acute findings on the CT image of the head, CT lumbar spine showing a nearly 50% compression fracture of the L1, which is likely related to her fall 2 weeks ago.  I agree with the radiologist interpretation  The patient does not demonstrate red flags of cauda equina syndrome is not requiring an emergent MRI of her L-spine at this time.  The patient was maintained on a cardiac monitor.  I personally viewed and interpreted the cardiac monitored which showed an underlying rhythm of: Regular heart rate  I ordered medication including IV potassium for hypokalemia repletion.  I have reviewed the patients home medicines and have made adjustments as needed  Test Considered: Low suspicion for meningitis or pulmonary embolism or sepsis.  I do not see indication for CT angio of the chest or lumbar puncture. No red flags for cauda equina syndrome and no indication for emergent MRI imaging of the lumbar spine   After the interventions noted above, I reevaluated the patient and found that they have: stayed the same   Dispostion:  After consideration of the diagnostic results and the patients response to treatment, I feel that the patent would benefit from outpatient neurosurgery follow-up, TLSO brace, continued encouragement to take fluids and electrolytes at her facility.  Patient is  signed out to Dr Maylon Peppers EDP pending f/u on UA, Covid test, and anticipated discharge back to her facility afterwards.  Patient tolerated oral intake here.         Final Clinical Impression(s) / ED Diagnoses Final diagnoses:  Hypokalemia  Weakness  Closed compression fracture of body of L1 vertebra ([place])    Rx / DC Orders ED Discharge Orders          Ordered    potassium chloride (KLOR-CON) 10 MEQ tablet  Daily        12/26/22 1728              Wyvonnia Dusky, MD 12/26/22 1729

## 2022-12-26 NOTE — ED Notes (Signed)
Pt is slowly getting potassium, however pt is moving and occluding line. 2nd line was started in pt's wrist to prevent occlusion, however IV is still occluding. Pt is now getting CT scan and when she returns a 3rd IV will be placed.

## 2023-01-28 NOTE — Progress Notes (Deleted)
[name] Return Visit  SUBJECTIVE  History of Present Illness: Kendra Charles is a 82 y.o. female seen in follow-up for OAB. Plan at last visit was start Myrbetriq 50mg  daily.     Past Medical History: Patient  has a past medical history of Hypercholesteremia, Hypertension, and PAD (peripheral artery disease) (HCC).   Past Surgical History: She  has no past surgical history on file.   Medications: She has a current medication list which includes the following prescription(s): acetaminophen, acetaminophen, alum & mag hydroxide-simeth, amlodipine, apixaban, cephalexin, diclofenac sodium, donepezil, doxycycline, estradiol, guaifenesin, levetiracetam, lisinopril, loperamide, loratadine, lorazepam, lovastatin, magnesium hydroxide, melatonin-theanine, metoprolol tartrate, mirabegron er, multivitamin with minerals, ondansetron, potassium chloride, sodium chloride, tramadol, and trazodone.   Allergies: Patient has No Known Allergies.   Social History: Patient  reports that she has quit smoking. She has never used smokeless tobacco. She reports that she does not currently use alcohol. She reports that she does not currently use drugs.      OBJECTIVE     Physical Exam: There were no vitals filed for this visit. Gen: No apparent distress, A&O x 3.  Detailed Urogynecologic Evaluation:  Deferred. Prior exam showed:      No data to display             ASSESSMENT AND PLAN    Kendra Charles is a 82 y.o. with:  No diagnosis found.

## 2023-01-30 ENCOUNTER — Ambulatory Visit: Payer: Medicare Other | Admitting: Obstetrics and Gynecology

## 2023-04-10 IMAGING — CT CT CERVICAL SPINE W/O CM
3 of 4 series · 12 of 33 positions shown, 14 images · non-contrast
Comparison: January 08, 2022

CLINICAL DATA: Neck trauma (Age >= 65y)



[Series 8: orthogonal axials · axial · 0.21mm/px · z∈[-349,-239]mm · 4 of 74 slices shown, 5 images]
[im 13/74  soft-tissue]
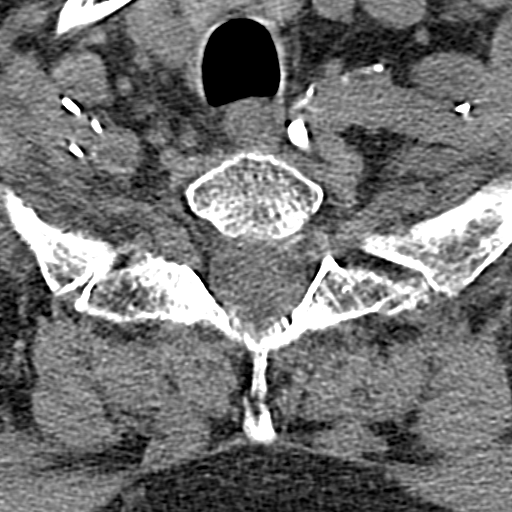
[im 13/74  bone]
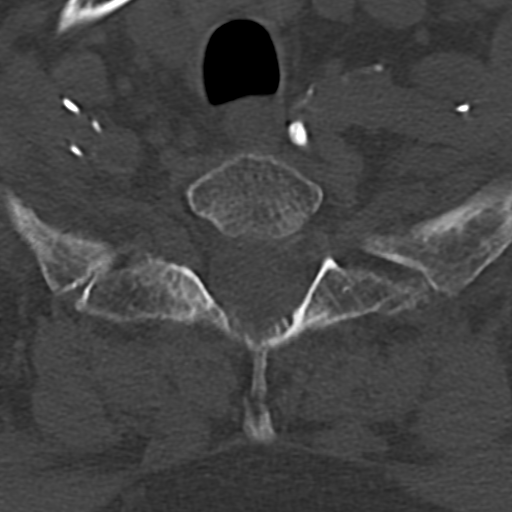
[im 25/74  bone]
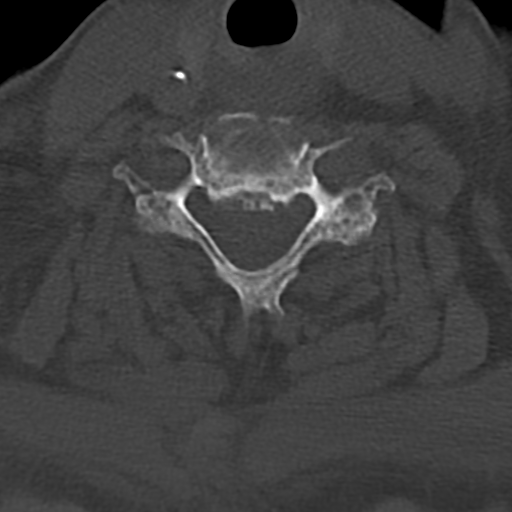
[im 49/74  bone]
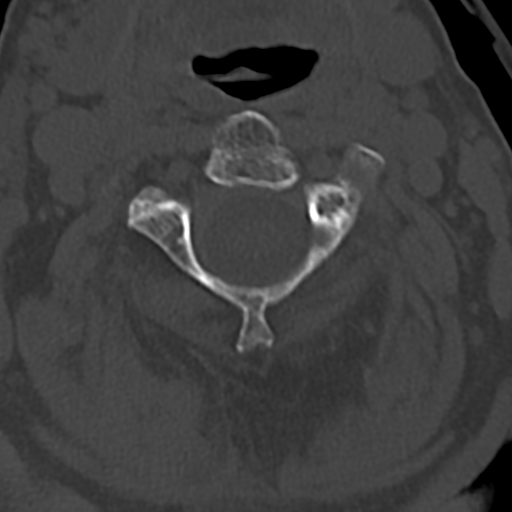
[im 61/74  bone]
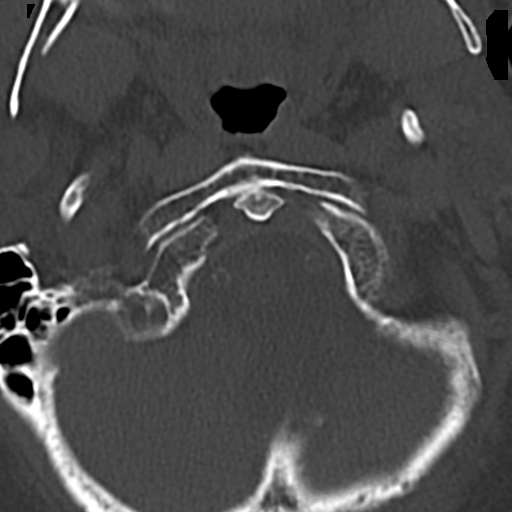

[Series 9: sag bone · sagittal · 0.33mm/px · 5 of 63 slices shown, 6 images]
[im 21/63  bone]
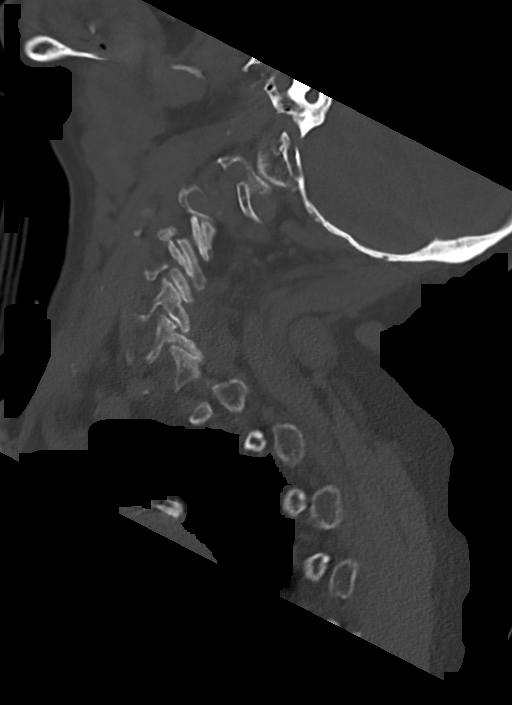
[im 26/63  bone]
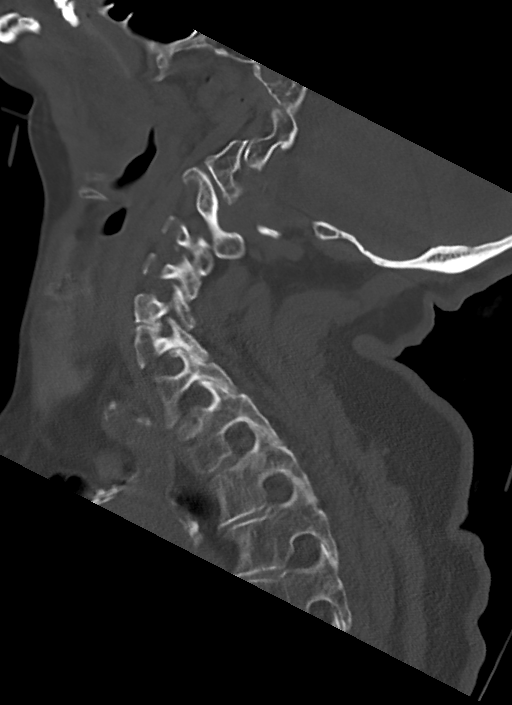
[im 32/63  soft-tissue]
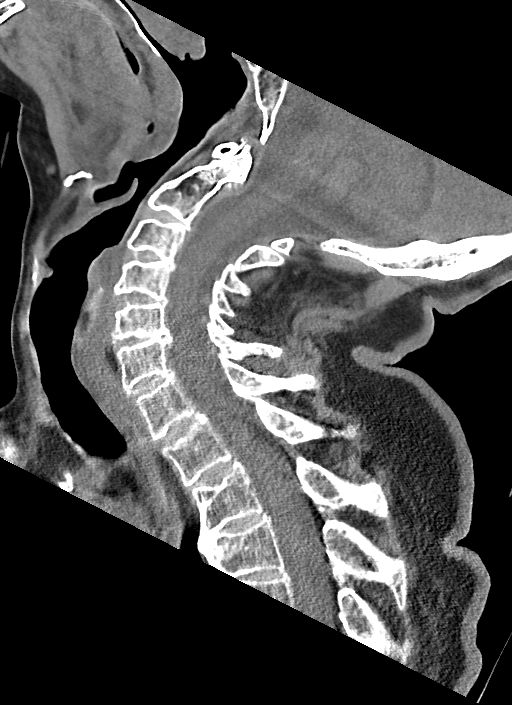
[im 32/63  bone]
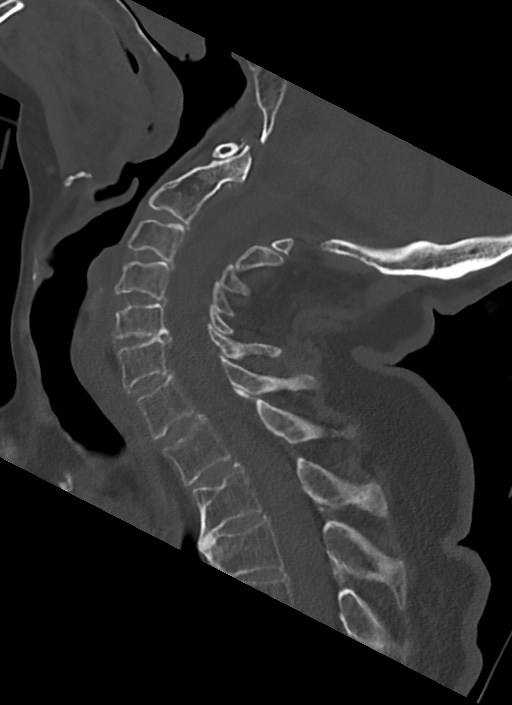
[im 37/63  bone]
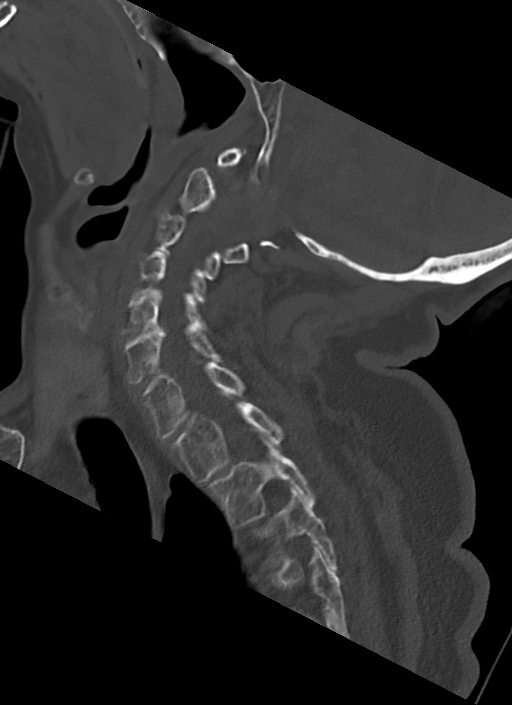
[im 42/63  bone]
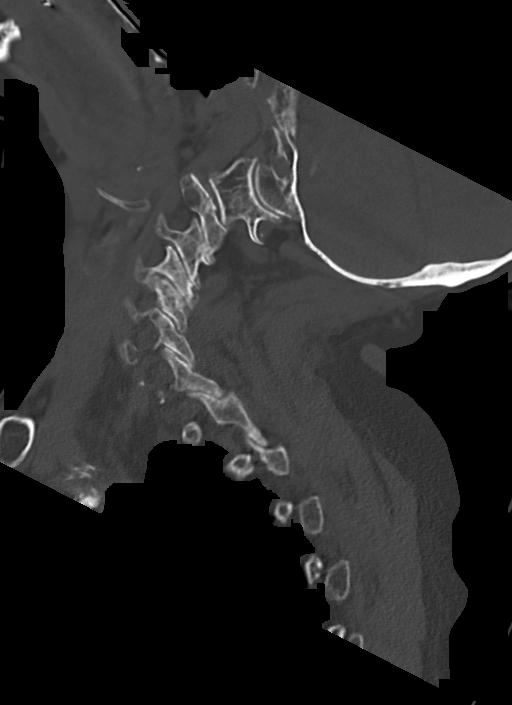

[Series 10: cor bone · coronal · 0.31mm/px · 3 of 82 slices shown]
[im 26/82  bone]
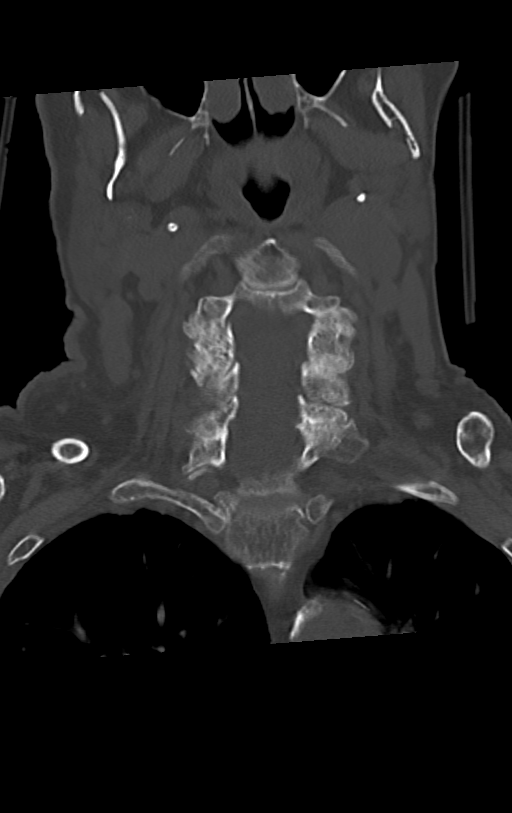
[im 36/82  bone]
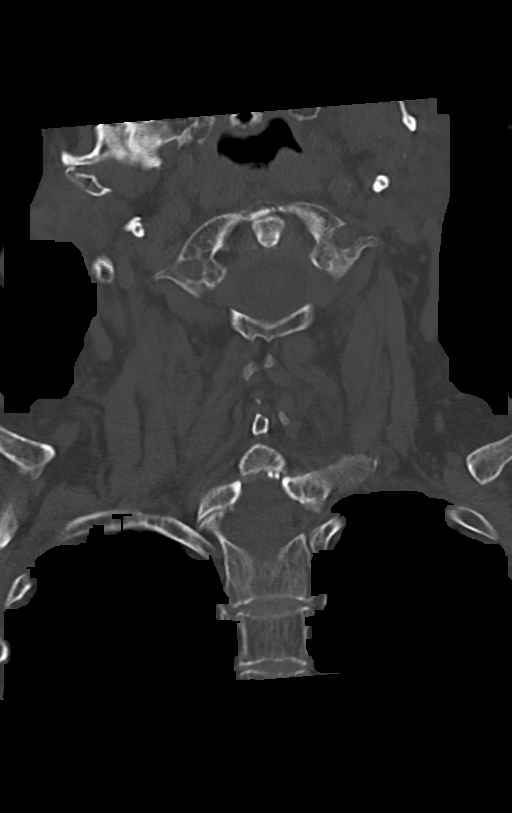
[im 46/82  bone]
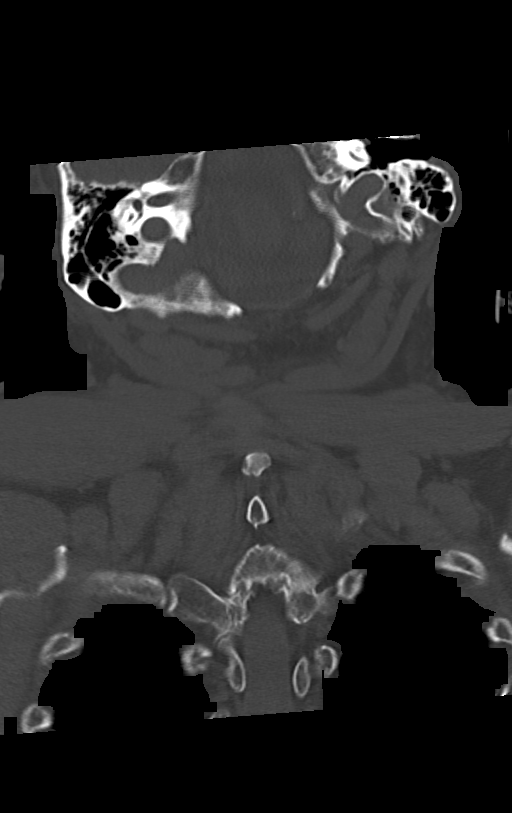

[12 of 33 positions shown; findings below may reference images not displayed]

FINDINGS: Alignment: Again seen is the hyper exaggeration of the cervical
lordosis.

Skull base and vertebrae: No acute fracture. No primary bone lesion
or focal pathologic process. Moderate cervical spondylosis and is
most prominent at the C5-C6 region. At C5-C6, there are prominent
marginal osteophytes and some narrowing of the disc space seen.

Soft tissues and spinal canal: No prevertebral fluid or swelling. No
visible canal hematoma.

Disc levels: Mild decrease in the height of the disc space at C5-C6.

Upper chest: There are some emphysematous changes seen. Severe
atheromatous calcifications of the thoracic aorta.

Other: None.
IMPRESSION: No acute fracture, subluxation or dislocation. Moderate cervical
spondylosis.

## 2023-04-10 IMAGING — CT CT HEAD W/O CM
3 of 5 series · 14 of 47 positions shown, 16 images · non-contrast
Comparison: None Available.

CLINICAL DATA: Status post fall



[Series 5: cor soft · coronal · 0.35mm/px · 3 of 68 slices shown]
[im 25/68  brain]
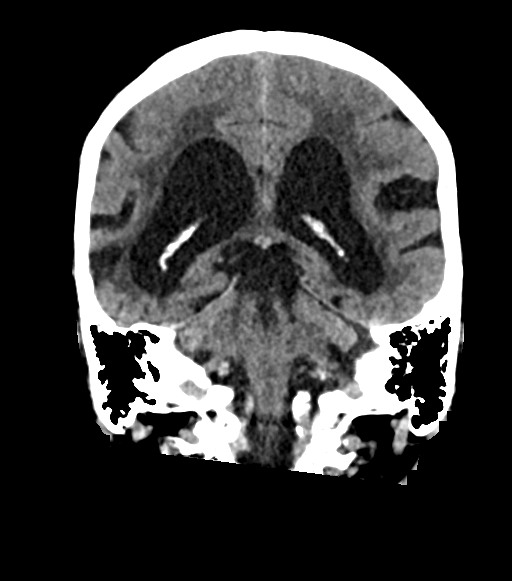
[im 31/68  brain]
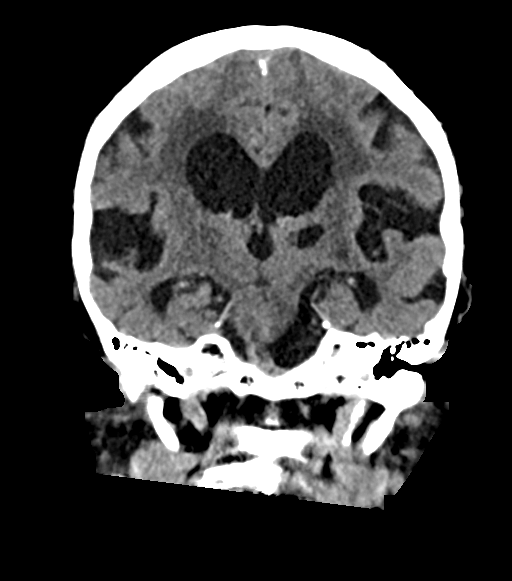
[im 37/68  brain]
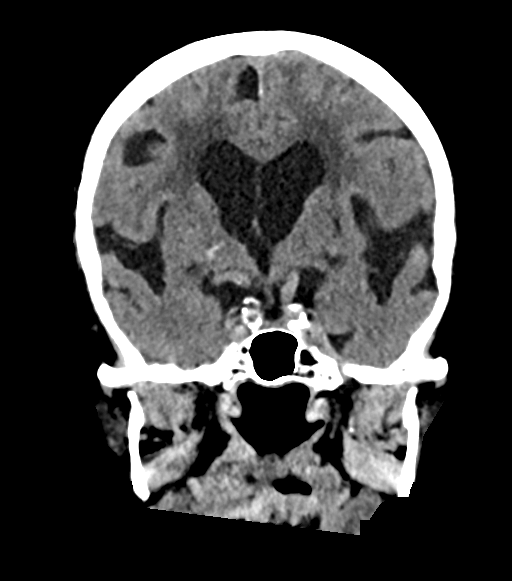

[Series 6: sag soft · sagittal · 0.41mm/px · 3 of 60 slices shown]
[im 20/60  brain]
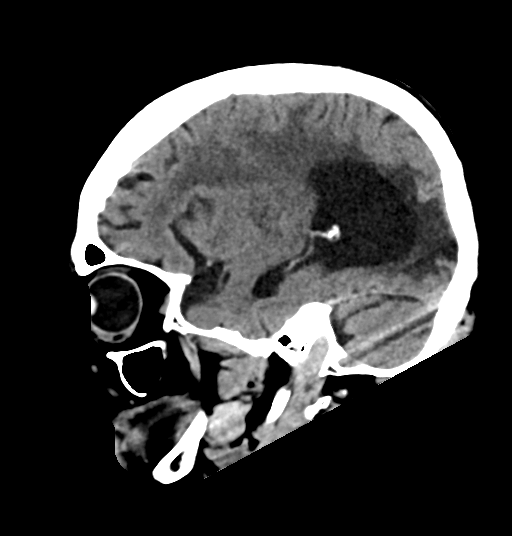
[im 30/60  brain]
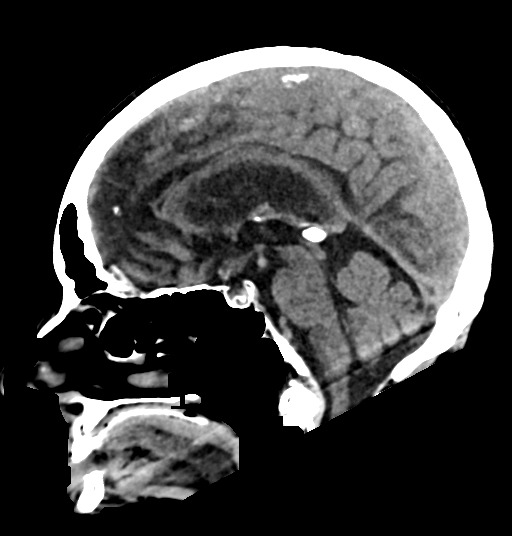
[im 40/60  brain]
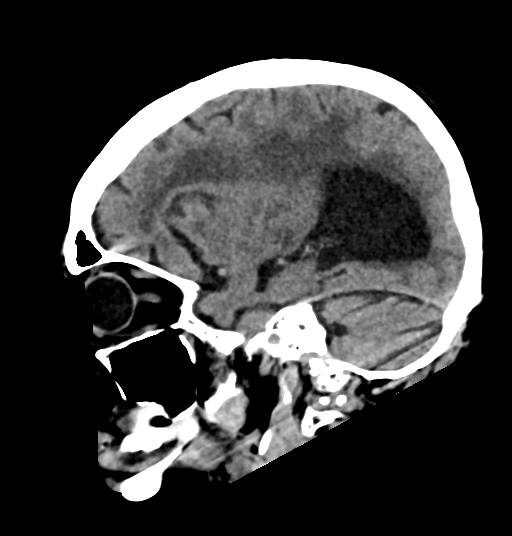

[Series 7: true axial · axial · 0.35mm/px · z∈[-226,-78]mm · 8 of 71 slices shown, 10 images]
[im 8/71  brain]
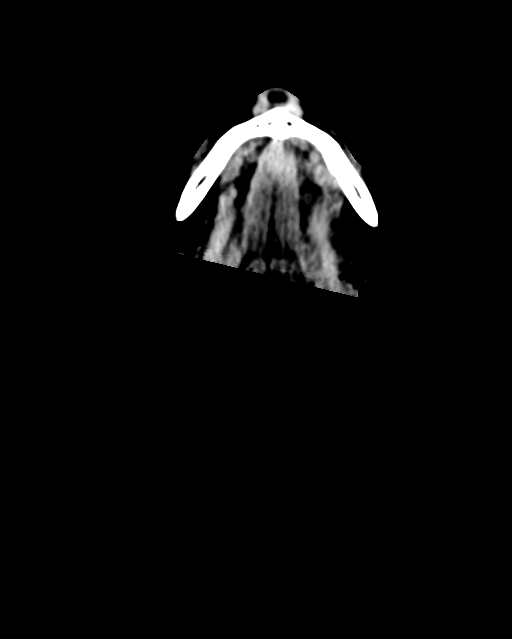
[im 8/71  bone]
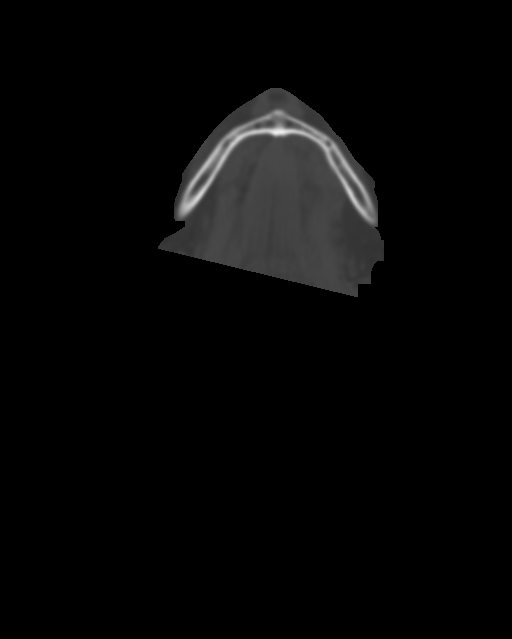
[im 15/71  brain]
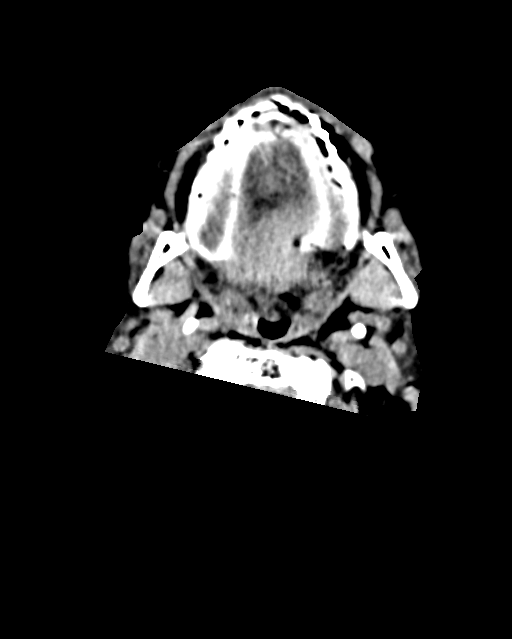
[im 22/71  brain]
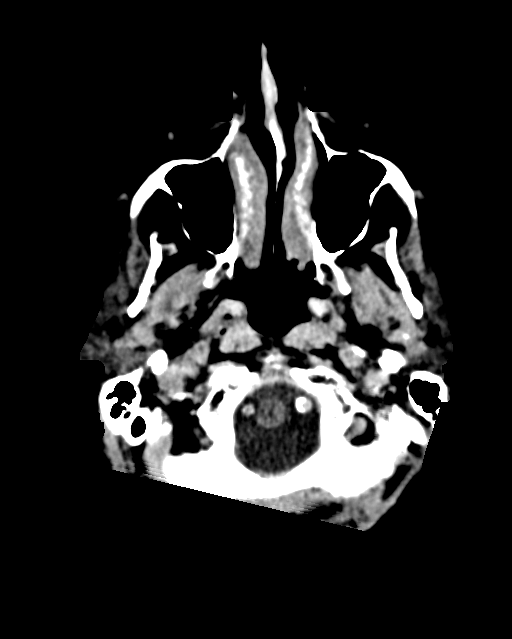
[im 29/71  brain]
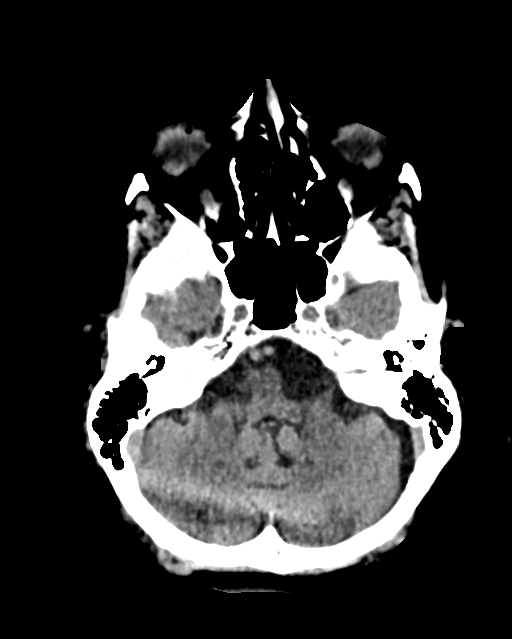
[im 43/71  brain]
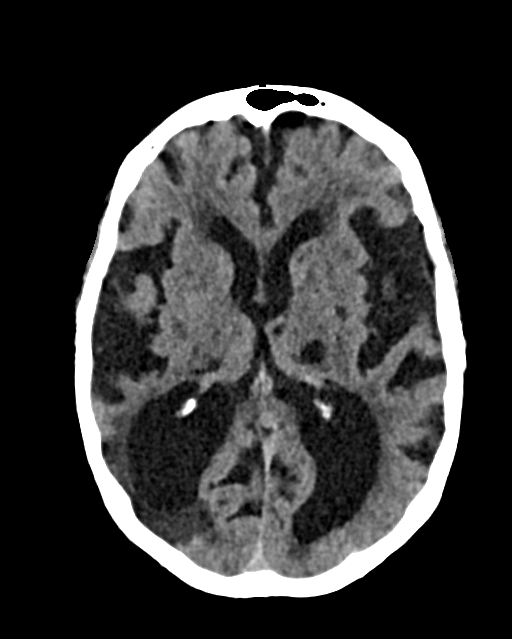
[im 43/71  bone]
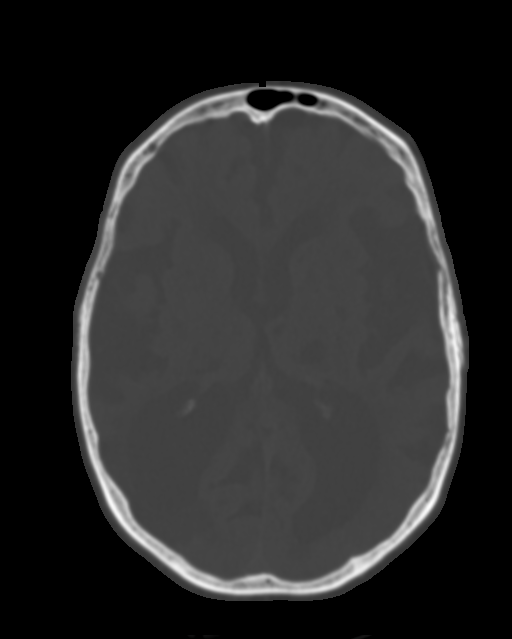
[im 50/71  brain]
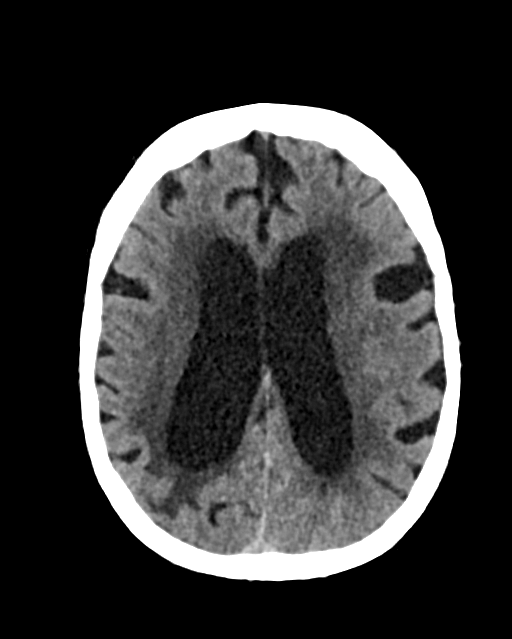
[im 57/71  brain]
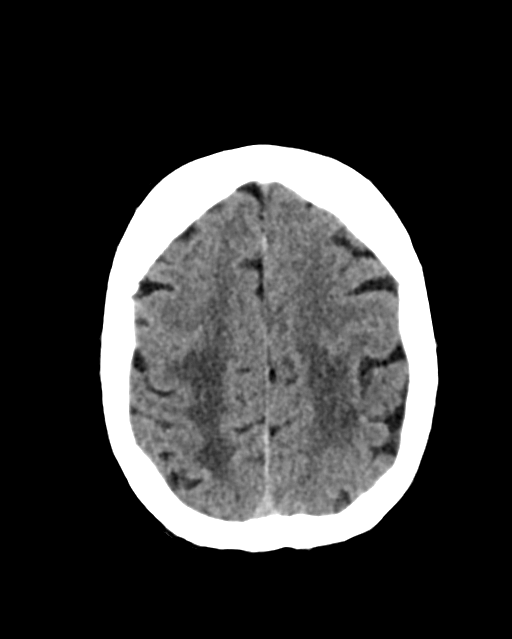
[im 64/71  brain]
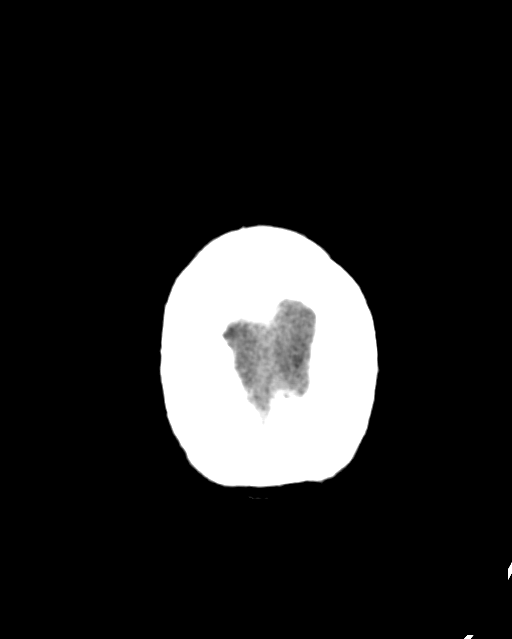

[14 of 47 positions shown; findings below may reference images not displayed]

FINDINGS: Brain: No evidence of acute infarction, hemorrhage, extra-axial
collection, ventriculomegaly, or mass effect. Old right occipital
lobe infarct. Old left thalamic lacunar infarct. Generalized
cerebral atrophy. Periventricular white matter low attenuation
likely secondary to microangiopathy.

Vascular: Cerebrovascular atherosclerotic calcifications are noted.

Skull: Negative for fracture or focal lesion.

Sinuses/Orbits: Visualized portions of the orbits are unremarkable.
Visualized portions of the paranasal sinuses are unremarkable.
Visualized portions of the mastoid air cells are unremarkable.

Other: None.
IMPRESSION: 1.   1.  No acute intracranial pathology.
2. Chronic microvascular disease and cerebral atrophy.

## 2023-04-30 ENCOUNTER — Encounter (HOSPITAL_COMMUNITY): Payer: Self-pay

## 2023-04-30 ENCOUNTER — Emergency Department (HOSPITAL_COMMUNITY)
Admission: EM | Admit: 2023-04-30 | Discharge: 2023-05-01 | Disposition: A | Discharge status: Home / Self Care | Payer: Medicare Other | Attending: Emergency Medicine | Admitting: Emergency Medicine

## 2023-04-30 ENCOUNTER — Other Ambulatory Visit: Payer: Self-pay

## 2023-04-30 ENCOUNTER — Emergency Department (HOSPITAL_COMMUNITY): Payer: Medicare Other

## 2023-04-30 DIAGNOSIS — I10 Essential (primary) hypertension: Secondary | ICD-10-CM | POA: Insufficient documentation

## 2023-04-30 DIAGNOSIS — K921 Melena: Secondary | ICD-10-CM | POA: Insufficient documentation

## 2023-04-30 DIAGNOSIS — I7121 Aneurysm of the ascending aorta, without rupture: Secondary | ICD-10-CM

## 2023-04-30 DIAGNOSIS — Z79899 Other long term (current) drug therapy: Secondary | ICD-10-CM | POA: Insufficient documentation

## 2023-04-30 DIAGNOSIS — F0390 Unspecified dementia without behavioral disturbance: Secondary | ICD-10-CM | POA: Insufficient documentation

## 2023-04-30 DIAGNOSIS — Z7901 Long term (current) use of anticoagulants: Secondary | ICD-10-CM | POA: Insufficient documentation

## 2023-04-30 DIAGNOSIS — K529 Noninfective gastroenteritis and colitis, unspecified: Secondary | ICD-10-CM

## 2023-04-30 LAB — COMPREHENSIVE METABOLIC PANEL
ALT: 13 U/L (ref 0–44)
AST: 24 U/L (ref 15–41)
Albumin: 3.8 g/dL (ref 3.5–5.0)
Alkaline Phosphatase: 109 U/L (ref 38–126)
Anion gap: 13 (ref 5–15)
BUN: 7 mg/dL — ABNORMAL LOW (ref 8–23)
CO2: 24 mmol/L (ref 22–32)
Calcium: 9.6 mg/dL (ref 8.9–10.3)
Chloride: 93 mmol/L — ABNORMAL LOW (ref 98–111)
Creatinine, Ser: 0.72 mg/dL (ref 0.44–1.00)
GFR, Estimated: >60 mL/min (ref >60)
Glucose, Bld: 118 mg/dL — ABNORMAL HIGH (ref 70–99)
Potassium: 3.7 mmol/L (ref 3.5–5.1)
Sodium: 130 mmol/L — ABNORMAL LOW (ref 135–145)
Total Bilirubin: 0.9 mg/dL (ref 0.3–1.2)
Total Protein: 7.1 g/dL (ref 6.5–8.1)

## 2023-04-30 LAB — CBC WITH DIFFERENTIAL/PLATELET
Abs Immature Granulocytes: 0.05 10*3/uL (ref 0.00–0.07)
Basophils Absolute: 0 10*3/uL (ref 0.0–0.1)
Basophils Relative: 0 %
Eosinophils Absolute: 0 10*3/uL (ref 0.0–0.5)
Eosinophils Relative: 0 %
HCT: 43.1 % (ref 36.0–46.0)
Hemoglobin: 15 g/dL (ref 12.0–15.0)
Immature Granulocytes: 0 %
Lymphocytes Relative: 8 %
Lymphs Abs: 1.4 10*3/uL (ref 0.7–4.0)
MCH: 31.7 pg (ref 26.0–34.0)
MCHC: 34.8 g/dL (ref 30.0–36.0)
MCV: 91.1 fL (ref 80.0–100.0)
Monocytes Absolute: 1 10*3/uL (ref 0.1–1.0)
Monocytes Relative: 6 %
Neutro Abs: 14 10*3/uL — ABNORMAL HIGH (ref 1.7–7.7)
Neutrophils Relative %: 86 %
Platelets: 359 10*3/uL (ref 150–400)
RBC: 4.73 MIL/uL (ref 3.87–5.11)
RDW: 12.5 % (ref 11.5–15.5)
WBC: 16.5 10*3/uL — ABNORMAL HIGH (ref 4.0–10.5)
nRBC: 0 % (ref 0.0–0.2)

## 2023-04-30 LAB — I-STAT CHEM 8, ED
BUN: 6 mg/dL — ABNORMAL LOW (ref 8–23)
Calcium, Ion: 1.16 mmol/L (ref 1.15–1.40)
Chloride: 94 mmol/L — ABNORMAL LOW (ref 98–111)
Creatinine, Ser: 0.6 mg/dL (ref 0.44–1.00)
Glucose, Bld: 115 mg/dL — ABNORMAL HIGH (ref 70–99)
HCT: 47 % — ABNORMAL HIGH (ref 36.0–46.0)
Hemoglobin: 16 g/dL — ABNORMAL HIGH (ref 12.0–15.0)
Potassium: 3.8 mmol/L (ref 3.5–5.1)
Sodium: 131 mmol/L — ABNORMAL LOW (ref 135–145)
TCO2: 26 mmol/L (ref 22–32)

## 2023-04-30 LAB — TYPE AND SCREEN
ABO/RH(D): O POS
Antibody Screen: NEGATIVE

## 2023-04-30 LAB — POC OCCULT BLOOD, ED: Fecal Occult Bld: POSITIVE — AB

## 2023-04-30 LAB — PROTIME-INR
INR: 1.2 (ref 0.8–1.2)
Prothrombin Time: 15.4 seconds — ABNORMAL HIGH (ref 11.4–15.2)

## 2023-04-30 MED ORDER — AMOXICILLIN-POT CLAVULANATE 875-125 MG PO TABS
1.0000 | ORAL_TABLET | Freq: Once | ORAL | Status: AC
  Administered 2023-04-30: 1 via ORAL
  Filled 2023-04-30: qty 1

## 2023-04-30 MED ORDER — ACETAMINOPHEN 500 MG PO TABS
1000.0000 mg | ORAL_TABLET | Freq: Once | ORAL | Status: AC
  Administered 2023-04-30: 1000 mg via ORAL
  Filled 2023-04-30: qty 2

## 2023-04-30 MED ORDER — IOHEXOL 350 MG/ML SOLN
75.0000 mL | Freq: Once | INTRAVENOUS | Status: AC | PRN
  Administered 2023-04-30: 75 mL via INTRAVENOUS

## 2023-04-30 MED ORDER — ONDANSETRON HCL 4 MG/2ML IJ SOLN
4.0000 mg | Freq: Once | INTRAMUSCULAR | Status: AC
  Administered 2023-04-30: 4 mg via INTRAVENOUS
  Filled 2023-04-30: qty 2

## 2023-04-30 NOTE — ED Provider Notes (Signed)
[place] EMERGENCY DEPARTMENT AT [facility] Provider Note   CSN: [id number] Arrival date & time: 04/30/23  1223     History  Chief Complaint  Patient presents with   Blood In Stools    Kendra Charles is a 82 y.o. female.  Patient is an 82 year old female past medical history of A-fib on Eliquis, vascular dementia, hypertension, seizure disorder presenting to the emergency department with a GI bleed.  Per EMS, the patient's nursing home staff reports that for the last few days she has had bloody bowel movements.  The patient does endorse abdominal pain and nausea to me.  Nursing home staff did report that she has had a decreased appetite.  The history is provided by the EMS personnel. History limited by: Level 5 caveat for dementia.       Home Medications Prior to Admission medications   Medication Sig Start Date End Date Taking? Authorizing Provider  acetaminophen (TYLENOL) 325 MG tablet Take 650 mg by mouth 2 (two) times daily.    [provider]  acetaminophen (TYLENOL) 500 MG tablet Take 500 mg by mouth every 6 (six) hours as needed for mild pain or headache (fever 99.5-101).    [provider]  alum & mag hydroxide-simeth (MAALOX/MYLANTA) 200-200-20 MG/5 SUSP Apply 20 mLs topically 3 (three) times daily as needed (indigestion).    [provider]  amLODipine (NORVASC) 5 MG tablet Take 5 mg by mouth daily.    [provider]  apixaban (ELIQUIS) 5 MG TABS tablet Take 1 tablet (5 mg total) by mouth 2 (two) times daily. 06/23/19   Kirt Boys, MD  cephALEXin (KEFLEX) 500 MG capsule Take 1 capsule (500 mg total) by mouth 3 (three) times daily. 12/18/22   Jacalyn Lefevre, MD  diclofenac Sodium (VOLTAREN) 1 % GEL Apply 4 g topically 3 (three) times daily.    [provider]  donepezil (ARICEPT) 5 MG tablet Take 5 mg by mouth at bedtime.    [provider]  doxycycline (VIBRA-TABS) 100 MG tablet Take 100 mg by mouth 2  (two) times daily. 08/08/22   [provider]  estradiol (ESTRACE) 0.1 MG/GM vaginal cream Place 0.5g in vagina twice a week 12/13/22   Marguerita Beards, MD  guaifenesin (ROBITUSSIN) 100 MG/5ML syrup Take 200 mg by mouth every 6 (six) hours as needed for cough.    [provider]  levETIRAcetam (KEPPRA) 500 MG tablet Take 1 tablet (500 mg total) by mouth 2 (two) times daily. 06/25/19   Kirt Boys, MD  lisinopril (ZESTRIL) 40 MG tablet Take 40 mg by mouth daily.    [provider]  loperamide (IMODIUM) 2 MG capsule Take 2 mg by mouth daily as needed for diarrhea or loose stools.    [provider]  loratadine (CLARITIN) 10 MG tablet Take 10 mg by mouth daily.    [provider]  LORazepam (ATIVAN) 0.5 MG tablet Take 0.5 mg by mouth 2 (two) times daily.    [provider]  lovastatin (MEVACOR) 40 MG tablet Take 40 mg by mouth at bedtime.  06/17/19   [provider]  magnesium hydroxide (MILK OF MAGNESIA) 400 MG/5ML suspension Take 30 mLs by mouth daily as needed for mild constipation.    [provider]  Melatonin-Theanine 10-5.5 MG TABS Take 1 tablet by mouth at bedtime.    [provider]  metoprolol tartrate (LOPRESSOR) 50 MG tablet Take 50 mg by mouth 2 (two) times daily.  [provider]  mirabegron ER (MYRBETRIQ) 50 MG TB24 tablet Take 1 tablet (50 mg total) by mouth daily. 12/11/22   Marguerita Beards, MD  Multiple Vitamins-Minerals (MULTIVITAMIN WITH MINERALS) tablet Take 1 tablet by mouth daily.    [provider]  ondansetron (ZOFRAN-ODT) 4 MG disintegrating tablet Take 4 mg by mouth every 6 (six) hours as needed for nausea or vomiting.    [provider]  potassium chloride (KLOR-CON) 10 MEQ tablet Take 1 tablet (10 mEq total) by mouth daily for 20 days. 12/26/22 01/15/23  Terald Sleeper, MD  sodium chloride 1 g tablet Take 1 g by mouth every other day.    [provider]  traMADol (ULTRAM) 50 MG tablet Take 25 mg by mouth 2 (two) times daily. For knee pain    [provider]  traZODone (DESYREL) 50 MG tablet Take 50 mg by mouth at bedtime.    [provider]      Allergies    Patient has no known allergies.    Review of Systems   Review of Systems  Physical Exam Updated Vital Signs BP (!) 148/96   Pulse 74   Temp 97.8 F (36.6 C) (Oral)   Resp 16   SpO2 95%  Physical Exam Vitals and nursing note reviewed.  Constitutional:      General: She is not in acute distress.    Appearance: Normal appearance.     Comments: Mildly frail and cachectic appearing  HENT:     Head: Normocephalic and atraumatic.     Nose: Nose normal.     Mouth/Throat:     Mouth: Mucous membranes are moist.     Pharynx: Oropharynx is clear.  Eyes:     Extraocular Movements: Extraocular movements intact.     Conjunctiva/sclera: Conjunctivae normal.  Cardiovascular:     Rate and Rhythm: Normal rate and regular rhythm.     Heart sounds: Normal heart sounds.  Pulmonary:     Effort: Pulmonary effort is normal.     Breath sounds: Normal breath sounds.  Abdominal:     General: Abdomen is flat.     Palpations: Abdomen is soft.     Tenderness: There is abdominal tenderness (Mild, diffuse). There is no guarding or rebound.  Musculoskeletal:        General: Normal range of motion.     Cervical back: Normal range of motion and neck supple.  Skin:    General: Skin is warm and dry.  Neurological:     General: No focal deficit present.     Mental Status: She is alert. Mental status is at baseline.     Comments: Oriented to person and place  Psychiatric:        Mood and Affect: Mood normal.        Behavior: Behavior normal.     ED Results / Procedures / Treatments   Labs (all labs ordered are listed, but only abnormal results are displayed) Labs Reviewed  CBC WITH DIFFERENTIAL/PLATELET - Abnormal; Notable for the following components:       Result Value   WBC 16.5 (*)    Neutro Abs 14.0 (*)    All other components within normal limits  PROTIME-INR - Abnormal; Notable for the following components:   Prothrombin Time 15.4 (*)    All other components within normal limits  POC OCCULT BLOOD, ED - Abnormal; Notable for the following components:   Fecal Occult Bld POSITIVE (*)  All other components within normal limits  URINALYSIS, ROUTINE W REFLEX MICROSCOPIC  COMPREHENSIVE METABOLIC PANEL  I-STAT CHEM 8, ED  TYPE AND SCREEN    EKG None  Radiology No results found.  Procedures Procedures    Medications Ordered in ED Medications  ondansetron (ZOFRAN) injection 4 mg (4 mg Intravenous Given 04/30/23 1517)    ED Course/ Medical Decision Making/ A&P Clinical Course as of 04/30/23 1709  Tue Apr 30, 2023  1456 Hemoccult positive stools, no anemia but does have leukocytosis. With associated abd pain will have CTAP to evaluate for intraabdominal infection. [VK]  1630 Patient signed out to Dr. Anitra Lauth pending labs and CT [VK]    Clinical Course User Index [VK] Rexford Maus, DO                             Medical Decision Making This patient presents to the ED with chief complaint(s) of rectal bleeding with pertinent past medical history of A fib on Eliquis, vascular dementia, HTN, seizure disorder which further complicates the presenting complaint. The complaint involves an extensive differential diagnosis and also carries with it a high risk of complications and morbidity.    The differential diagnosis includes GI bleed, anemia, coagulopathy, hemorrhoids, fissures  Additional history obtained: Additional history obtained from EMS  Records reviewed Nursing Home Documents  ED Course and Reassessment: On patient's arrival to the emergency department she is hemodynamically stable in no acute distress.  Patient will have labs to evaluate for anemia or coagulopathy and Hemoccult to evaluate for GI bleed in the  setting of Eliquis use and will be closely reassessed.  Independent labs interpretation:  The following labs were independently interpreted: Hgb stable, CMP pending  Independent visualization of imaging: -CTAP pending      Amount and/or Complexity of Data Reviewed Labs: ordered. Radiology: ordered.  Risk Prescription drug management.          Final Clinical Impression(s) / ED Diagnoses Final diagnoses:  Blood in stool    Rx / DC Orders ED Discharge Orders     None         Rexford Maus, DO 04/30/23 1709

## 2023-04-30 NOTE — ED Notes (Signed)
ED TO INPATIENT HANDOFF REPORT  ED Nurse Name and Phone #: [phone]  S Name/Age/Gender Kendra Charles 82 y.o. female Room/Bed: 002C/002C  Code Status   Code Status: Prior  Home/SNF/Other Nursing Home [facility] Patient oriented to: self Is this baseline? Yes   Triage Complete: Triage complete  Chief Complaint poss GI bleed  Triage Note Pt was BIB [facility] from SNF. Staff reported patient had 5 casa of blood in stool, possible GI bleed. Patient is on Eliquis. Patient have not been eating or drinking in the past 42 hours. Vital sign. BP 124/90, pulse 74, RR 18, saturation 95% RA.   Allergies No Known Allergies  Level of Care/Admitting Diagnosis ED Disposition     ED Disposition  Discharge   Condition  --   Comment  I, Gwyneth Sprout, MD, have physically assessed this patient and they appear reasonably screened and/or stabilized for discharge and I doubt any other medical condition or other [facility] requiring further screening, evaluation, or treatment in the ED ex ists or is present at this time prior to discharge.          B Medical/Surgery History Past Medical History:  Diagnosis Date   Hypercholesteremia    Hypertension    PAD (peripheral artery disease) (HCC)    No past surgical history on file.   A IV Location/Drains/Wounds Patient Lines/Drains/Airways Status     Active Line/Drains/Airways     Name Placement date Placement time Site Days   Peripheral IV 04/30/23 20 G 1" Left;Medial Antecubital 04/30/23  1925  Antecubital  less than 1            Intake/Output Last 24 hours No intake or output data in the 24 hours ending 04/30/23 2158  Labs/Imaging Results for orders placed or performed during the hospital encounter of 04/30/23 (from the past 48 hour(s))  POC occult blood, ED     Status: Abnormal   Collection Time: 04/30/23  1:17 PM  Result Value Ref Range   Fecal Occult Bld POSITIVE (A) NEGATIVE  CBC with Differential     Status:  Abnormal   Collection Time: 04/30/23  2:01 PM  Result Value Ref Range   WBC 16.5 (H) 4.0 - 10.5 K/uL   RBC 4.73 3.87 - 5.11 MIL/uL   Hemoglobin 15.0 12.0 - 15.0 g/dL   HCT 29.5 62.1 - 30.8 %   MCV 91.1 80.0 - 100.0 fL   MCH 31.7 26.0 - 34.0 pg   MCHC 34.8 30.0 - 36.0 g/dL   RDW 65.7 84.6 - 96.2 %   Platelets 359 150 - 400 K/uL   nRBC 0.0 0.0 - 0.2 %   Neutrophils Relative % 86 %   Neutro Abs 14.0 (H) 1.7 - 7.7 K/uL   Lymphocytes Relative 8 %   Lymphs Abs 1.4 0.7 - 4.0 K/uL   Monocytes Relative 6 %   Monocytes Absolute 1.0 0.1 - 1.0 K/uL   Eosinophils Relative 0 %   Eosinophils Absolute 0.0 0.0 - 0.5 K/uL   Basophils Relative 0 %   Basophils Absolute 0.0 0.0 - 0.1 K/uL   Immature Granulocytes 0 %   Abs Immature Granulocytes 0.05 0.00 - 0.07 K/uL    Comment: Performed at [facility], [address] [place], [place] [address]  Protime-INR     Status: Abnormal   Collection Time: 04/30/23  2:01 PM  Result Value Ref Range   Prothrombin Time 15.4 (H) 11.4 - 15.2 seconds   INR 1.2 0.8 -  1.2    Comment: (NOTE) INR goal varies based on device and disease states. Performed at [facility], [address], [place], [place] [address]   Type and screen [facility]     Status: None   Collection Time: 04/30/23  2:15 PM  Result Value Ref Range   ABO/RH(D) O POS    Antibody Screen NEG    Sample Expiration      05/03/2023,2359 Performed at [facility], [address], [place], [place] [address]   Comprehensive metabolic panel     Status: Abnormal   Collection Time: 04/30/23  4:46 PM  Result Value Ref Range   Sodium 130 (L) 135 - 145 mmol/L   Potassium 3.7 3.5 - 5.1 mmol/L   Chloride 93 (L) 98 - 111 mmol/L   CO2 24 22 - 32 mmol/L   Glucose, Bld 118 (H) 70 - 99 mg/dL    Comment: Glucose reference range applies only to samples taken after fasting for at least 8 hours.   BUN 7 (L) 8 - 23 mg/dL   Creatinine, Ser 0.98 0.44 - 1.00 mg/dL   Calcium  9.6 8.9 - 11.9 mg/dL   Total Protein 7.1 6.5 - 8.1 g/dL   Albumin 3.8 3.5 - 5.0 g/dL   AST 24 15 - 41 U/L   ALT 13 0 - 44 U/L   Alkaline Phosphatase 109 38 - 126 U/L   Total Bilirubin 0.9 0.3 - 1.2 mg/dL   GFR, Estimated >14 >78 mL/min    Comment: (NOTE) Calculated using the CKD-EPI Creatinine Equation (2021)    Anion gap 13 5 - 15    Comment: Performed at [facility], [address], [place], [place] [address]  I-stat chem 8, ED     Status: Abnormal   Collection Time: 04/30/23  5:49 PM  Result Value Ref Range   Sodium 131 (L) 135 - 145 mmol/L   Potassium 3.8 3.5 - 5.1 mmol/L   Chloride 94 (L) 98 - 111 mmol/L   BUN 6 (L) 8 - 23 mg/dL   Creatinine, Ser 1.30 0.44 - 1.00 mg/dL   Glucose, Bld 865 (H) 70 - 99 mg/dL    Comment: Glucose reference range applies only to samples taken after fasting for at least 8 hours.   Calcium, Ion 1.16 1.15 - 1.40 mmol/L   TCO2 26 22 - 32 mmol/L   Hemoglobin 16.0 (H) 12.0 - 15.0 g/dL   HCT 78.4 (H) 69.6 - 29.5 %   CT ABDOMEN PELVIS W CONTRAST  Result Date: 04/30/2023 CLINICAL DATA:  Abdominal pain.  Blood in stool, possible GI bleed. EXAM: CT ABDOMEN AND PELVIS WITH CONTRAST TECHNIQUE: Multidetector CT imaging of the abdomen and pelvis was performed using the standard protocol following bolus administration of intravenous contrast. RADIATION DOSE REDUCTION: This exam was performed according to the departmental dose-optimization program which includes automated exposure control, adjustment of the mA and/or kV according to patient size and/or use of iterative reconstruction technique. CONTRAST:  75mL OMNIPAQUE IOHEXOL 350 MG/ML SOLN COMPARISON:  09/04/2019. FINDINGS: Lower chest: The heart is enlarged and three-vessel coronary artery calcifications are noted. There is atherosclerotic calcification of the aorta with aneurysmal dilatation of the ascending aorta measuring 4.9 cm. Mild airspace disease is noted in the left lower lobe. Hepatobiliary: No focal  abnormality in the liver. No biliary ductal dilatation. The gallbladder is without stones. Pancreas: Unremarkable. No pancreatic ductal dilatation or surrounding inflammatory changes. Spleen: Normal in  size without focal abnormality. Adrenals/Urinary Tract: The adrenal glands are within normal limits. The kidneys enhance symmetrically. Renal cortical scarring is noted on the left. Calcifications are noted in the kidneys bilaterally, possible vascular calcifications versus renal calculi. No hydronephrosis. Mild bladder wall thickening is noted. Stomach/Bowel: Stomach is within normal limits. Appendix appears normal. No bowel obstruction, free air or pneumatosis. Scattered diverticula are present along the sigmoid colon without evidence of diverticulitis. There is diffuse colonic wall thickening extending into the rectum. Vascular/Lymphatic: Aortic atherosclerosis. No enlarged abdominal or pelvic lymph nodes. Reproductive: Coarse calcifications are noted in the uterus likely representing degenerating fibroid. No adnexal mass. Other: There is pelvic floor laxity with likely rectal and vaginal prolapse. No abdominopelvic ascites. Musculoskeletal: There is a compression deformity at L1 with loss of vertebral body height approximately 60% with a slightly retropulsed element a stable compression deformity is noted in the superior endplate at T11. Degenerative changes are noted in the thoracolumbar spine. IMPRESSION: 1. Diffuse colonic wall thickening, compatible with colitis. 2. Compression deformity at L1 with loss of vertebral body height of 60% with retropulsed element, indeterminate in age. 3. Patchy airspace disease in the left lower lobe, possible atelectasis or infiltrate. 4. Mild diffuse bladder wall thickening, possible infectious or inflammatory cystitis. 5. Aortic atherosclerosis with aneurysmal dilatation of the ascending aorta measuring 4.9 cm. Ascending thoracic aortic aneurysm. Recommend semi-annual imaging  followup by CTA or MRA and referral to cardiothoracic surgery if not already obtained. This recommendation follows 2010 ACCF/AHA/AATS/ACR/ASA/SCA/SCAI/SIR/STS/SVM Guidelines for the Diagnosis and Management of Patients With Thoracic Aortic Disease. Circulation. 2010; 121: G956-O130. Aortic aneurysm NOS (ICD10-I71.9) 6. Cardiomegaly with coronary artery calcifications. 7. Remaining incidental findings as described above. Electronically Signed   By: Thornell Sartorius M.D.   On: 04/30/2023 20:56    Pending Labs Unresulted Labs (From admission, onward)     Start     Ordered   04/30/23 1457  Urinalysis, Routine w reflex microscopic -Urine, Clean Catch  Once,   URGENT       Question:  Specimen Source  Answer:  Urine, Clean Catch   04/30/23 1456            Vitals/Pain Today's Vitals   04/30/23 1923 04/30/23 2045 04/30/23 2045 04/30/23 2145  BP:  (!) 143/104  (!) 126/90  Pulse:  86  96  Resp:  18  18  Temp: (!) 97.1 F (36.2 C)     TempSrc: Oral     SpO2:  95%  95%  PainSc:   0-No pain     Isolation Precautions No active isolations  Medications Medications  ondansetron (ZOFRAN) injection 4 mg (4 mg Intravenous Given 04/30/23 1517)  iohexol (OMNIPAQUE) 350 MG/ML injection 75 mL (75 mLs Intravenous Contrast Given 04/30/23 1938)  acetaminophen (TYLENOL) tablet 1,000 mg (1,000 mg Oral Given 04/30/23 2141)  amoxicillin-clavulanate (AUGMENTIN) 875-125 MG per tablet 1 tablet (1 tablet Oral Given 04/30/23 2141)    Mobility non-ambulatory     Focused Assessments Cardiac Assessment Handoff:  Cardiac Rhythm: Atrial fibrillation Lab Results  Component Value Date   CKTOTAL 311 (H) 09/04/2019   CKMB 2.4 09/04/2019   Lab Results  Component Value Date   DDIMER 0.63 (H) 09/08/2019   Does the Patient currently have chest pain? No    R Recommendations: See Admitting Provider Note  Report given to:   Additional Notes:

## 2023-04-30 NOTE — ED Notes (Signed)
Ptar called pt to [facility] no ETA 3rd out

## 2023-04-30 NOTE — ED Provider Notes (Signed)
Came from SNF for rectal bleeding on eliquis with blood tinged stool.  Abd pain, N/V but no fever.  Brown stool with mixed blood here.  Hb stable but does have leukocytosis of 16.5.  CMP with unchanged persistent hyponatremia with sodium of 131 which is similar to prior numbers over the last few months, normal renal function and hemoccult positive.  CT abd/pelvis per radiology shows diffuse colonic wall thickening compatible with colitis which would explain her vomiting and diarrhea.  Does report a patchy airspace disease in the left lower lobe which is most likely atelectasis as patient has been laying on her left side, also mild diffuse bladder wall thickening possible infectious or inflammatory cystitis.  Patient is found to have an aortic atherosclerosis with aneurysmal dilation of the ascending aorta measuring 4.9 cm and recommend semiannual follow-up with CTA or MRA.  On repeat evaluation patient has had a bowel movement it does not appear bloody at all and is brown and soft.  She has not recently been on antibiotics to suggest that this would be C. difficile.  Will confirm that patient is able to take the antibiotic orally and she was given some Tylenol for her abdominal pain.  She otherwise has had normal vital signs and appears comfortable without any vomiting during her stay here which has been 9 hours.  Feel that patient would be stable for discharge home to continue Augmentin.  9:53 PM Patient was able to tolerate the Augmentin and Tylenol.  At this time feel that patient is stable for discharge.   Gwyneth Sprout, MD 04/30/23 2206

## 2023-04-30 NOTE — Discharge Instructions (Signed)
On the CAT scan today Ms. Arauz has colitis.  She will need to be on antibiotics for the next week.  She can take Tylenol 500 mg every 6 hours as needed for abdominal discomfort.  If she starts having high fevers, persistent vomiting, inability to hold down the antibiotic she should return to the emergency room.  Also while getting the scan it was noted that she had an aortic aneurysm that is 4.9 cm.  She will need to have follow-up on this twice a year and her regular doctor can follow-up with this.  It is not the cause of her symptoms currently.

## 2023-04-30 NOTE — ED Notes (Signed)
PT was able to tolerate taking medications (pills) orally

## 2023-04-30 NOTE — ED Notes (Signed)
Patient transported to CT 

## 2023-04-30 NOTE — ED Triage Notes (Signed)
Pt was BIB [facility] from SNF. Staff reported patient had 5 casa of blood in stool, possible GI bleed. Patient is on Eliquis. Patient have not been eating or drinking in the past 42 hours. Vital sign. BP 124/90, pulse 74, RR 18, saturation 95% RA.

## 2023-04-30 NOTE — ED Notes (Signed)
PT had a loose stool that had no blood in it.

## 2023-04-30 NOTE — ED Notes (Signed)
Report called in to [place] (Scientist, clinical (histocompatibility and immunogenetics)) at [facility]

## 2023-05-12 ENCOUNTER — Inpatient Hospital Stay (HOSPITAL_COMMUNITY)
Admission: EM | Admit: 2023-05-12 | Discharge: 2023-05-15 | DRG: 312 | Disposition: A | Discharge status: Skilled Nursing Facility | Payer: Medicare Other | Source: Skilled Nursing Facility | Attending: Internal Medicine | Admitting: Internal Medicine

## 2023-05-12 ENCOUNTER — Emergency Department (HOSPITAL_COMMUNITY): Payer: Medicare Other

## 2023-05-12 DIAGNOSIS — I10 Essential (primary) hypertension: Secondary | ICD-10-CM | POA: Diagnosis present

## 2023-05-12 DIAGNOSIS — F0394 Unspecified dementia, unspecified severity, with anxiety: Secondary | ICD-10-CM | POA: Diagnosis present

## 2023-05-12 DIAGNOSIS — E785 Hyperlipidemia, unspecified: Secondary | ICD-10-CM | POA: Diagnosis present

## 2023-05-12 DIAGNOSIS — I480 Paroxysmal atrial fibrillation: Secondary | ICD-10-CM | POA: Diagnosis present

## 2023-05-12 DIAGNOSIS — G9340 Encephalopathy, unspecified: Secondary | ICD-10-CM | POA: Diagnosis present

## 2023-05-12 DIAGNOSIS — Z87898 Personal history of other specified conditions: Secondary | ICD-10-CM

## 2023-05-12 DIAGNOSIS — R55 Syncope and collapse: Secondary | ICD-10-CM | POA: Diagnosis not present

## 2023-05-12 DIAGNOSIS — R4182 Altered mental status, unspecified: Secondary | ICD-10-CM | POA: Diagnosis not present

## 2023-05-12 DIAGNOSIS — Z7901 Long term (current) use of anticoagulants: Secondary | ICD-10-CM

## 2023-05-12 DIAGNOSIS — E441 Mild protein-calorie malnutrition: Secondary | ICD-10-CM | POA: Diagnosis present

## 2023-05-12 DIAGNOSIS — K5730 Diverticulosis of large intestine without perforation or abscess without bleeding: Secondary | ICD-10-CM | POA: Diagnosis present

## 2023-05-12 DIAGNOSIS — F411 Generalized anxiety disorder: Secondary | ICD-10-CM | POA: Diagnosis present

## 2023-05-12 DIAGNOSIS — R4189 Other symptoms and signs involving cognitive functions and awareness: Principal | ICD-10-CM

## 2023-05-12 DIAGNOSIS — Z8616 Personal history of COVID-19: Secondary | ICD-10-CM

## 2023-05-12 DIAGNOSIS — G40909 Epilepsy, unspecified, not intractable, without status epilepticus: Secondary | ICD-10-CM | POA: Diagnosis present

## 2023-05-12 DIAGNOSIS — Z79899 Other long term (current) drug therapy: Secondary | ICD-10-CM

## 2023-05-12 DIAGNOSIS — I739 Peripheral vascular disease, unspecified: Secondary | ICD-10-CM | POA: Diagnosis present

## 2023-05-12 DIAGNOSIS — E871 Hypo-osmolality and hyponatremia: Secondary | ICD-10-CM | POA: Diagnosis present

## 2023-05-12 DIAGNOSIS — R7989 Other specified abnormal findings of blood chemistry: Secondary | ICD-10-CM

## 2023-05-12 DIAGNOSIS — Z87891 Personal history of nicotine dependence: Secondary | ICD-10-CM

## 2023-05-12 DIAGNOSIS — Z8249 Family history of ischemic heart disease and other diseases of the circulatory system: Secondary | ICD-10-CM

## 2023-05-12 DIAGNOSIS — Z681 Body mass index (BMI) 19 or less, adult: Secondary | ICD-10-CM

## 2023-05-12 DIAGNOSIS — G9341 Metabolic encephalopathy: Secondary | ICD-10-CM | POA: Diagnosis present

## 2023-05-12 DIAGNOSIS — E7800 Pure hypercholesterolemia, unspecified: Secondary | ICD-10-CM | POA: Diagnosis present

## 2023-05-12 DIAGNOSIS — R9431 Abnormal electrocardiogram [ECG] [EKG]: Secondary | ICD-10-CM | POA: Diagnosis present

## 2023-05-12 DIAGNOSIS — E876 Hypokalemia: Secondary | ICD-10-CM | POA: Diagnosis present

## 2023-05-12 DIAGNOSIS — D72829 Elevated white blood cell count, unspecified: Secondary | ICD-10-CM | POA: Diagnosis present

## 2023-05-12 LAB — CBC WITH DIFFERENTIAL/PLATELET
Abs Immature Granulocytes: 0.15 10*3/uL — ABNORMAL HIGH (ref 0.00–0.07)
Basophils Absolute: 0.1 10*3/uL (ref 0.0–0.1)
Basophils Relative: 0 %
Eosinophils Absolute: 0.1 10*3/uL (ref 0.0–0.5)
Eosinophils Relative: 1 %
HCT: 40.7 % (ref 36.0–46.0)
Hemoglobin: 12.7 g/dL (ref 12.0–15.0)
Immature Granulocytes: 1 %
Lymphocytes Relative: 12 %
Lymphs Abs: 2 10*3/uL (ref 0.7–4.0)
MCH: 29.8 pg (ref 26.0–34.0)
MCHC: 31.2 g/dL (ref 30.0–36.0)
MCV: 95.5 fL (ref 80.0–100.0)
Monocytes Absolute: 1.1 10*3/uL — ABNORMAL HIGH (ref 0.1–1.0)
Monocytes Relative: 6 %
Neutro Abs: 13 10*3/uL — ABNORMAL HIGH (ref 1.7–7.7)
Neutrophils Relative %: 80 %
Platelets: 343 10*3/uL (ref 150–400)
RBC: 4.26 MIL/uL (ref 3.87–5.11)
RDW: 12.1 % (ref 11.5–15.5)
WBC: 16.4 10*3/uL — ABNORMAL HIGH (ref 4.0–10.5)
nRBC: 0 % (ref 0.0–0.2)

## 2023-05-12 LAB — I-STAT VENOUS BLOOD GAS, ED
Acid-Base Excess: 0 mmol/L (ref 0.0–2.0)
Bicarbonate: 22.6 mmol/L (ref 20.0–28.0)
Calcium, Ion: 1.01 mmol/L — ABNORMAL LOW (ref 1.15–1.40)
HCT: 39 % (ref 36.0–46.0)
Hemoglobin: 13.3 g/dL (ref 12.0–15.0)
O2 Saturation: 51 %
Potassium: 3.3 mmol/L — ABNORMAL LOW (ref 3.5–5.1)
Sodium: 135 mmol/L (ref 135–145)
TCO2: 24 mmol/L (ref 22–32)
pCO2, Ven: 31.2 mmHg — ABNORMAL LOW (ref 44–60)
pH, Ven: 7.468 — ABNORMAL HIGH (ref 7.25–7.43)
pO2, Ven: 25 mmHg — CL (ref 32–45)

## 2023-05-12 LAB — COMPREHENSIVE METABOLIC PANEL
ALT: 13 U/L (ref 0–44)
AST: 20 U/L (ref 15–41)
Albumin: 3.2 g/dL — ABNORMAL LOW (ref 3.5–5.0)
Alkaline Phosphatase: 121 U/L (ref 38–126)
Anion gap: 13 (ref 5–15)
BUN: 6 mg/dL — ABNORMAL LOW (ref 8–23)
CO2: 20 mmol/L — ABNORMAL LOW (ref 22–32)
Calcium: 8.8 mg/dL — ABNORMAL LOW (ref 8.9–10.3)
Chloride: 101 mmol/L (ref 98–111)
Creatinine, Ser: 0.73 mg/dL (ref 0.44–1.00)
GFR, Estimated: >60 mL/min (ref >60)
Glucose, Bld: 124 mg/dL — ABNORMAL HIGH (ref 70–99)
Potassium: 3.4 mmol/L — ABNORMAL LOW (ref 3.5–5.1)
Sodium: 134 mmol/L — ABNORMAL LOW (ref 135–145)
Total Bilirubin: 0.7 mg/dL (ref 0.3–1.2)
Total Protein: 6.1 g/dL — ABNORMAL LOW (ref 6.5–8.1)

## 2023-05-12 LAB — TROPONIN I (HIGH SENSITIVITY)
Troponin I (High Sensitivity): 8 ng/L (ref <18)
Troponin I (High Sensitivity): 8 ng/L (ref <18)

## 2023-05-12 LAB — BRAIN NATRIURETIC PEPTIDE: B Natriuretic Peptide: 201.6 pg/mL — ABNORMAL HIGH (ref 0.0–100.0)

## 2023-05-12 LAB — URINALYSIS, ROUTINE W REFLEX MICROSCOPIC
Bilirubin Urine: NEGATIVE
Glucose, UA: NEGATIVE mg/dL
Hgb urine dipstick: NEGATIVE
Ketones, ur: NEGATIVE mg/dL
Leukocytes,Ua: NEGATIVE
Nitrite: NEGATIVE
Protein, ur: NEGATIVE mg/dL
Specific Gravity, Urine: 1.005 (ref 1.005–1.030)
pH: 6 (ref 5.0–8.0)

## 2023-05-12 LAB — AMMONIA: Ammonia: 13 umol/L (ref 9–35)

## 2023-05-12 MED ORDER — SODIUM CHLORIDE 0.9 % IV BOLUS
500.0000 mL | Freq: Once | INTRAVENOUS | Status: AC
  Administered 2023-05-12: 500 mL via INTRAVENOUS

## 2023-05-12 MED ORDER — ZIPRASIDONE MESYLATE 20 MG IM SOLR
10.0000 mg | Freq: Once | INTRAMUSCULAR | Status: AC
  Administered 2023-05-12: 10 mg via INTRAMUSCULAR
  Filled 2023-05-12: qty 20

## 2023-05-12 MED ORDER — IOHEXOL 350 MG/ML SOLN
75.0000 mL | Freq: Once | INTRAVENOUS | Status: AC | PRN
  Administered 2023-05-12: 75 mL via INTRAVENOUS

## 2023-05-12 MED ORDER — STERILE WATER FOR INJECTION IJ SOLN
INTRAMUSCULAR | Status: AC
  Filled 2023-05-12: qty 10

## 2023-05-12 NOTE — ED Notes (Signed)
Update given to Sarajane Marek, resident manager at [facility] regarding patient status.

## 2023-05-12 NOTE — ED Provider Notes (Signed)
[place] EMERGENCY DEPARTMENT AT [facility] Provider Note  CSN: [id number] Arrival date & time: 05/12/23 1949  Chief Complaint(s) Altered Mental Status  HPI Kendra Charles is a 81 y.o. female with past medical history as below, significant for dementia, seizure, HLD, HTN, PAD, afib who presents to the ED with complaint of AMS. Pt from SNF with AMS. Per SNF staff pt was briefly unresponsive, unsure if she had pulse so CPR was initiated by SNF staff for 3-5 minutes. She began grabbing at bystanders and CPR was stopped. On EMS arrival she reported chest pain and difficulty breathing, difficult to obtain history from her 2/2 her dementia. She required supplemental oxygen to keep O2 sat >92%, per EMS does not wear O2 at snf. EMS also reports the patient was on the floor by bed when they arrived. Full code per EMS  Past Medical History Past Medical History:  Diagnosis Date   Hypercholesteremia    Hypertension    PAD (peripheral artery disease) (HCC)    Patient Active Problem List   Diagnosis Date Noted   A-fib (HCC) 03/19/2021   Hypokalemia 03/18/2021   Leucocytosis 03/18/2021   Acute metabolic encephalopathy 03/18/2021   Acute hypoxemic respiratory failure due to COVID-19 [facility]) 09/04/2019   Seizure (HCC) 06/19/2019   Hyponatremia 06/18/2019   AKI (acute kidney injury) (HCC) 06/18/2019   Dementia (HCC) 06/18/2019   Essential hypertension 06/18/2019   Hyperlipidemia 06/18/2019   Home Medication(s) Prior to Admission medications   Medication Sig Start Date End Date Taking? Authorizing Provider  acetaminophen (TYLENOL) 500 MG tablet Take 500 mg by mouth every 6 (six) hours as needed for mild pain or headache (fever 99.5-101).    [provider]  alum & mag hydroxide-simeth (MAALOX/MYLANTA) 200-200-20 MG/5 SUSP Apply 20 mLs topically 3 (three) times daily as needed (indigestion).    [provider]  Amino Acids-Protein Hydrolys (FEEDING SUPPLEMENT, PRO-STAT  64,) LIQD Take 30 mLs by mouth 2 (two) times daily with a meal.    [provider]  amLODipine (NORVASC) 5 MG tablet Take 5 mg by mouth daily.    [provider]  apixaban (ELIQUIS) 5 MG TABS tablet Take 1 tablet (5 mg total) by mouth 2 (two) times daily. Patient taking differently: Take 2.5 mg by mouth 2 (two) times daily. 06/23/19   Kirt Boys, MD  cephALEXin (KEFLEX) 500 MG capsule Take 1 capsule (500 mg total) by mouth 3 (three) times daily. Patient not taking: Reported on 04/30/2023 12/18/22   Jacalyn Lefevre, MD  diclofenac Sodium (VOLTAREN) 1 % GEL Apply 4 g topically 3 (three) times daily. To right knee    [provider]  donepezil (ARICEPT) 5 MG tablet Take 5 mg by mouth at bedtime.    [provider]  estradiol (ESTRACE) 0.1 MG/GM vaginal cream Place 0.5g in vagina twice a week Patient not taking: Reported on 04/30/2023 12/13/22   Marguerita Beards, MD  levETIRAcetam (KEPPRA) 500 MG tablet Take 1 tablet (500 mg total) by mouth 2 (two) times daily. 06/25/19   Kirt Boys, MD  lisinopril (ZESTRIL) 40 MG tablet Take 40 mg by mouth daily.    [provider]  loratadine (CLARITIN) 10 MG tablet Take 10 mg by mouth daily.    [provider]  LORazepam (ATIVAN) 0.5 MG tablet Take 0.5 mg by mouth 2 (two) times daily.    [provider]  lovastatin (MEVACOR) 40 MG tablet Take 40 mg by mouth at bedtime.  06/17/19  [provider]  magnesium hydroxide (MILK OF MAGNESIA) 400 MG/5ML suspension Take 30 mLs by mouth daily as needed for mild constipation.    [provider]  Melatonin-Theanine 10-5.5 MG TABS Take 1 tablet by mouth at bedtime.    [provider]  metoprolol tartrate (LOPRESSOR) 50 MG tablet Take 50 mg by mouth 2 (two) times daily.    [provider]  mirabegron ER (MYRBETRIQ) 50 MG TB24 tablet Take 1 tablet (50 mg total) by mouth daily. 12/11/22   Marguerita Beards, MD  Multiple  Vitamins-Minerals (MULTIVITAMIN WITH MINERALS) tablet Take 1 tablet by mouth daily.    [provider]  ondansetron (ZOFRAN-ODT) 4 MG disintegrating tablet Take 4 mg by mouth every 6 (six) hours as needed for nausea or vomiting.    [provider]  potassium chloride (KLOR-CON) 10 MEQ tablet Take 1 tablet (10 mEq total) by mouth daily for 20 days. Patient not taking: Reported on 04/30/2023 12/26/22 04/30/23  Terald Sleeper, MD  potassium chloride SA (KLOR-CON M) 20 MEQ tablet Take 20 mEq by mouth daily.    [provider]  sodium chloride 1 g tablet Take 1 g by mouth every other day.    [provider]  traMADol (ULTRAM) 50 MG tablet Take 25 mg by mouth 2 (two) times daily. For knee pain    [provider]  traZODone (DESYREL) 50 MG tablet Take 50 mg by mouth at bedtime.    [provider]                                                                                                                                    Past Surgical History No past surgical history on file. Family History Family History  Problem Relation Age of Onset   Hypertension Mother    Hypertension Father     Social History Social History   Tobacco Use   Smoking status: Former   Smokeless tobacco: Never  Advertising account planner   Vaping status: Never Used  Substance Use Topics   Alcohol use: Not Currently   Drug use: Not Currently   Allergies Patient has no known allergies.  Review of Systems Review of Systems  Unable to perform ROS: Dementia  Cardiovascular:  Positive for chest pain.    Physical Exam Vital Signs  I have reviewed the triage vital signs BP 130/80 (BP Location: Left Arm)   Pulse 92   Temp (!) 97.4 F (36.3 C) (Oral)   Resp 17   Wt 52 kg   SpO2 94%   BMI 19.68 kg/m  Physical Exam Vitals and nursing note reviewed.  Constitutional:      General: She is not in acute distress.    Appearance: Normal appearance. She is well-developed. She is  not ill-appearing.     Comments: frail  HENT:     Head: Normocephalic and atraumatic. No raccoon eyes, Battle's sign,  right periorbital erythema or left periorbital erythema.     Jaw: There is normal jaw occlusion.     Right Ear: External ear normal.     Left Ear: External ear normal.     Nose: Nose normal.     Mouth/Throat:     Mouth: Mucous membranes are moist.  Eyes:     General: No scleral icterus.       Right eye: No discharge.        Left eye: No discharge.  Cardiovascular:     Rate and Rhythm: Normal rate.     Pulses: Normal pulses.     Heart sounds: Normal heart sounds.  Pulmonary:     Effort: Pulmonary effort is normal. No respiratory distress.     Breath sounds: Normal breath sounds. No stridor.  Abdominal:     General: Abdomen is flat. There is no distension.     Palpations: Abdomen is soft.     Tenderness: There is no abdominal tenderness. There is no guarding.  Musculoskeletal:        General: No deformity.     Cervical back: No rigidity.     Comments: Stool noted in brief, no sig sacral ulcer  No sig pain w/ log roll BLLE  No midline spinous process tenderness to palpation or percussion, no crepitus or step-off.     Skin:    General: Skin is warm and dry.     Coloration: Skin is not cyanotic, jaundiced or pale.  Neurological:     Mental Status: She is alert.     GCS: GCS eye subscore is 4. GCS verbal subscore is 5. GCS motor subscore is 6.     Comments: Aox1 to self Pt difficult historian Moving all 4 extremities and will cross midline No clonus or seizure   Psychiatric:        Speech: Speech normal.        Behavior: Behavior normal. Behavior is cooperative.     ED Results and Treatments Labs (all labs ordered are listed, but only abnormal results are displayed) Labs Reviewed  COMPREHENSIVE METABOLIC PANEL - Abnormal; Notable for the following components:      Result Value   Sodium 134 (*)    Potassium 3.4 (*)    CO2 20 (*)    Glucose, Bld 124  (*)    BUN 6 (*)    Calcium 8.8 (*)    Total Protein 6.1 (*)    Albumin 3.2 (*)    All other components within normal limits  CBC WITH DIFFERENTIAL/PLATELET - Abnormal; Notable for the following components:   WBC 16.4 (*)    Neutro Abs 13.0 (*)    Monocytes Absolute 1.1 (*)    Abs Immature Granulocytes 0.15 (*)    All other components within normal limits  BRAIN NATRIURETIC PEPTIDE - Abnormal; Notable for the following components:   B Natriuretic Peptide 201.6 (*)    All other components within normal limits  URINALYSIS, ROUTINE W REFLEX MICROSCOPIC - Abnormal; Notable for the following components:   Color, Urine STRAW (*)    All other components within normal limits  I-STAT VENOUS BLOOD GAS, ED - Abnormal; Notable for the following components:   pH, Ven 7.468 (*)    pCO2, Ven 31.2 (*)    pO2, Ven 25 (*)    Potassium 3.3 (*)    Calcium, Ion 1.01 (*)    All other components within normal limits  AMMONIA  TROPONIN I (HIGH SENSITIVITY)  TROPONIN I (HIGH SENSITIVITY)                                                                                                                          Radiology CT ABDOMEN PELVIS W CONTRAST  Result Date: 05/13/2023 CLINICAL DATA:  Abdominal pain, acute, nonlocalized EXAM: CT ABDOMEN AND PELVIS WITH CONTRAST TECHNIQUE: Multidetector CT imaging of the abdomen and pelvis was performed using the standard protocol following bolus administration of intravenous contrast. RADIATION DOSE REDUCTION: This exam was performed according to the departmental dose-optimization program which includes automated exposure control, adjustment of the mA and/or kV according to patient size and/or use of iterative reconstruction technique. CONTRAST:  75mL OMNIPAQUE IOHEXOL 350 MG/ML SOLN COMPARISON:  CT abdomen pelvis 04/30/2023 FINDINGS: Lower chest: Cardiomegaly.  No acute abnormality. Hepatobiliary: No focal liver abnormality. No gallstones, gallbladder wall thickening, or  pericholecystic fluid. No biliary dilatation. Pancreas: No focal lesion. Normal pancreatic contour. No surrounding inflammatory changes. No main pancreatic ductal dilatation. Spleen: Normal in size without focal abnormality. Adrenals/Urinary Tract: No adrenal nodule bilaterally. Bilateral kidneys enhance symmetrically. Calcifications associated with the kidneys likely vascular. No hydronephrosis. No hydroureter. The urinary bladder is unremarkable. On delayed imaging, there is no urothelial wall thickening and there are no filling defects in the opacified portions of the bilateral collecting systems or ureters. Stomach/Bowel: Stomach is within normal limits. No evidence of bowel wall thickening or dilatation. Diffuse colonic diverticulosis. Appendix appears normal. Vascular/Lymphatic: No abdominal aorta or iliac aneurysm. Severe atherosclerotic plaque of the aorta and its branches. No abdominal, pelvic, or inguinal lymphadenopathy. Reproductive: Limited evaluation due to streak artifact. No definite mass. Coarse calcification within the uterus likely a fibroid. Other: No intraperitoneal free fluid. No intraperitoneal free gas. No organized fluid collection. Musculoskeletal: No abdominal wall hernia or abnormality. No suspicious lytic or blastic osseous lesions. No diffusely decreased bone density. Stable chronic incomplete burst fracture of the L1 vertebral body with at least 65% vertebral body height loss and 4 mm retropulsion into the central canal. IMPRESSION: 1. Colonic diverticulosis with no acute diverticulitis. 2. Likely uterine fibroid. 3. Diffusely decreased bone density. Stable chronic incomplete burst fracture of the L1 vertebral body with at least 65% vertebral body height loss and 4 mm retropulsion into the central canal. 4.  Aortic Atherosclerosis (ICD10-I70.0). Electronically Signed   By: Tish Frederickson M.D.   On: 05/13/2023 00:23   CT Head Wo Contrast  Result Date: 05/12/2023 CLINICAL DATA:   Head trauma, minor (Age >= 65y); Neck trauma (Age >= 65y) EXAM: CT HEAD WITHOUT CONTRAST CT CERVICAL SPINE WITHOUT CONTRAST TECHNIQUE: Multidetector CT imaging of the head and cervical spine was performed following the standard protocol without intravenous contrast. Multiplanar CT image reconstructions of the cervical spine were also generated. RADIATION DOSE REDUCTION: This exam was performed according to the departmental dose-optimization program which includes automated exposure control, adjustment of the mA and/or kV according to patient size and/or use of iterative reconstruction technique. COMPARISON:  CT  head 09/15/2020, CT head 12/26/2022, MRI head 08/11/2022 FINDINGS: CT HEAD FINDINGS Brain: Cerebral ventricle sizes are concordant with the degree of cerebral volume loss. Patchy and confluent areas of decreased attenuation are noted throughout the deep and periventricular white matter of the cerebral hemispheres bilaterally, compatible with chronic microvascular ischemic disease. Similar-appearing right temporoparietal and occipital encephalomalacia. Similar-appearing left basal ganglia/thalamus infarction. No evidence of large-territorial acute infarction. No parenchymal hemorrhage. No mass lesion. No extra-axial collection. No mass effect or midline shift. No hydrocephalus. Basilar cisterns are patent. Vascular: No hyperdense vessel. Skull: No acute fracture or focal lesion. Sinuses/Orbits: Paranasal sinuses and mastoid air cells are clear. The orbits are unremarkable. Other: None. CT CERVICAL SPINE FINDINGS Alignment: Normal. Skull base and vertebrae: Diffusely decreased bone density. Multilevel mild degenerative changes spine. Multilevel posterior disc osteophyte complex formation most prominent at the C5-C6 level. No severe osseous neural foraminal or central canal stenosis. No acute fracture. No aggressive appearing focal osseous lesion or focal pathologic process. Soft tissues and spinal canal: No  prevertebral fluid or swelling. No visible canal hematoma. Upper chest: Emphysematous changes. Other: Atherosclerotic plaque of the aortic arch and its main branches. IMPRESSION: 1. No acute intracranial abnormality in a patient with chronic similar-appearing cerebral infarctions and chronic microvascular ischemic changes. 2. No acute displaced fracture or traumatic listhesis of the cervical spine. 3. Diffusely decreased bone density. 4. Aortic Atherosclerosis (ICD10-I70.0) and Emphysema (ICD10-J43.9). Electronically Signed   By: Tish Frederickson M.D.   On: 05/12/2023 22:01   CT Cervical Spine Wo Contrast  Result Date: 05/12/2023 CLINICAL DATA:  Head trauma, minor (Age >= 65y); Neck trauma (Age >= 65y) EXAM: CT HEAD WITHOUT CONTRAST CT CERVICAL SPINE WITHOUT CONTRAST TECHNIQUE: Multidetector CT imaging of the head and cervical spine was performed following the standard protocol without intravenous contrast. Multiplanar CT image reconstructions of the cervical spine were also generated. RADIATION DOSE REDUCTION: This exam was performed according to the departmental dose-optimization program which includes automated exposure control, adjustment of the mA and/or kV according to patient size and/or use of iterative reconstruction technique. COMPARISON:  CT head 09/15/2020, CT head 12/26/2022, MRI head 08/11/2022 FINDINGS: CT HEAD FINDINGS Brain: Cerebral ventricle sizes are concordant with the degree of cerebral volume loss. Patchy and confluent areas of decreased attenuation are noted throughout the deep and periventricular white matter of the cerebral hemispheres bilaterally, compatible with chronic microvascular ischemic disease. Similar-appearing right temporoparietal and occipital encephalomalacia. Similar-appearing left basal ganglia/thalamus infarction. No evidence of large-territorial acute infarction. No parenchymal hemorrhage. No mass lesion. No extra-axial collection. No mass effect or midline shift. No  hydrocephalus. Basilar cisterns are patent. Vascular: No hyperdense vessel. Skull: No acute fracture or focal lesion. Sinuses/Orbits: Paranasal sinuses and mastoid air cells are clear. The orbits are unremarkable. Other: None. CT CERVICAL SPINE FINDINGS Alignment: Normal. Skull base and vertebrae: Diffusely decreased bone density. Multilevel mild degenerative changes spine. Multilevel posterior disc osteophyte complex formation most prominent at the C5-C6 level. No severe osseous neural foraminal or central canal stenosis. No acute fracture. No aggressive appearing focal osseous lesion or focal pathologic process. Soft tissues and spinal canal: No prevertebral fluid or swelling. No visible canal hematoma. Upper chest: Emphysematous changes. Other: Atherosclerotic plaque of the aortic arch and its main branches. IMPRESSION: 1. No acute intracranial abnormality in a patient with chronic similar-appearing cerebral infarctions and chronic microvascular ischemic changes. 2. No acute displaced fracture or traumatic listhesis of the cervical spine. 3. Diffusely decreased bone density. 4. Aortic Atherosclerosis (ICD10-I70.0) and Emphysema (ICD10-J43.9).  Electronically Signed   By: Tish Frederickson M.D.   On: 05/12/2023 22:01   DG Pelvis Portable  Result Date: 05/12/2023 CLINICAL DATA:  ?post cpr EXAM: PORTABLE PELVIS 1-2 VIEWS COMPARISON:  CT abdomen pelvis 04/30/2023 FINDINGS: Limited evaluation due to overlapping osseous structures and overlying soft tissues. There is no evidence of pelvic fracture or diastasis. No pelvic bone lesions are seen. Degenerative changes lower visualized lumbar spine. Vascular calcifications. IMPRESSION: 1. Negative for acute traumatic injury. Limited evaluation due to overlapping osseous structures and overlying soft tissues. 2.  Aortic Atherosclerosis (ICD10-I70.0). Electronically Signed   By: Tish Frederickson M.D.   On: 05/12/2023 20:29   DG Chest Port 1 View  Result Date:  05/12/2023 CLINICAL DATA:  ?post cpr EXAM: PORTABLE CHEST 1 VIEW.  Patient is rotated. COMPARISON:  CT C-spine 03/22/2022, chest x-ray 12/18/2022 FINDINGS: The heart and mediastinal contours are unchanged. Aortic calcification. Biapical pleural/pulmonary scarring. Biapical cystic changes and emphysematous changes. No focal consolidation. Chronic coarsened markings with no overt pulmonary edema. No pleural effusion. No pneumothorax. No acute osseous abnormality.  Old healed left clavicular fracture. IMPRESSION: 1. No active disease. 2. Aortic Atherosclerosis (ICD10-I70.0) and Emphysema (ICD10-J43.9). Electronically Signed   By: Tish Frederickson M.D.   On: 05/12/2023 20:28    Pertinent labs & imaging results that were available during my care of the patient were reviewed by me and considered in my medical decision making (see MDM for details).  Medications Ordered in ED Medications  sodium chloride 0.9 % bolus 500 mL (0 mLs Intravenous Stopped 05/12/23 2116)  ziprasidone (GEODON) injection 10 mg (10 mg Intramuscular Given 05/12/23 2114)  sterile water (preservative free) injection (  Given 05/12/23 2116)  iohexol (OMNIPAQUE) 350 MG/ML injection 75 mL (75 mLs Intravenous Contrast Given 05/12/23 2349)                                                                                                                                     Procedures Procedures  (including critical care time)  Medical Decision Making / ED Course    Medical Decision Making:    Kendra Charles is a 82 y.o. female with history as above here 2/2 AMS, also chest pain. The complaint involves an extensive differential diagnosis and also carries with it a high risk of complications and morbidity.  Serious etiology was considered. Ddx includes but is not limited to: Differential diagnoses for altered mental status includes but is not exclusive to alcohol, illicit or prescription medications, intracranial pathology such as stroke,  intracerebral hemorrhage, fever or infectious causes including sepsis, hypoxemia, uremia, trauma, endocrine related disorders such as diabetes, hypoglycemia, thyroid-related diseases, etc. Differential includes all life-threatening causes for chest pain. This includes but is not exclusive to acute coronary syndrome, aortic dissection, pulmonary embolism, cardiac tamponade, community-acquired pneumonia, pericarditis, musculoskeletal chest wall pain, etc.    Complete initial physical exam performed, notably the patient  was intermittently agitated,  will desaturate when she takes off her O2, pulling lines, will give geodon.    Reviewed and confirmed nursing documentation for past medical history, family history, social history.  Vital signs reviewed.    Clinical Course as of 05/13/23 0041  Sun May 12, 2023  2126 CXR/pelvis XR stable [SG]  2221 WBC(!): 16.4 Similar level 12 days ago [SG]  2329 Now complaining of abdominal pain, will get CTAP. Abd is not peritoneal on exam. Unable to identify specific location of discomfort  [SG]    Clinical Course User Index [SG] Tanda Rockers A, DO    Pt here with possible cardiac arrest at SNF with apparent ROSC after 3-4 minutes of CPR without medications.  Story unclear and pt is very poor historian Workup in ED thus far re-assurring EKG w/o ischemia and trop neg x2 She has leukocytosis but similar to prior Imaging stable Pending CTAP at shift change  Signed out to incoming EDP pending CTAP She was briefly hypoxic en route but this has improved, she intermittently is pulling her lines/o2  Observation would be reasonable given possible arrest at SNF although the history at this time is unclear Mental status improved, hx dementia, no focal deficits       Additional history obtained: -Additional history obtained from EMS -External records from outside source obtained and reviewed including: Chart review including previous notes, labs, imaging,  consultation notes including prior ed visits, prior labs/imaging/home meds    Lab Tests: -I ordered, reviewed, and interpreted labs.   The pertinent results include:   Labs Reviewed  COMPREHENSIVE METABOLIC PANEL - Abnormal; Notable for the following components:      Result Value   Sodium 134 (*)    Potassium 3.4 (*)    CO2 20 (*)    Glucose, Bld 124 (*)    BUN 6 (*)    Calcium 8.8 (*)    Total Protein 6.1 (*)    Albumin 3.2 (*)    All other components within normal limits  CBC WITH DIFFERENTIAL/PLATELET - Abnormal; Notable for the following components:   WBC 16.4 (*)    Neutro Abs 13.0 (*)    Monocytes Absolute 1.1 (*)    Abs Immature Granulocytes 0.15 (*)    All other components within normal limits  BRAIN NATRIURETIC PEPTIDE - Abnormal; Notable for the following components:   B Natriuretic Peptide 201.6 (*)    All other components within normal limits  URINALYSIS, ROUTINE W REFLEX MICROSCOPIC - Abnormal; Notable for the following components:   Color, Urine STRAW (*)    All other components within normal limits  I-STAT VENOUS BLOOD GAS, ED - Abnormal; Notable for the following components:   pH, Ven 7.468 (*)    pCO2, Ven 31.2 (*)    pO2, Ven 25 (*)    Potassium 3.3 (*)    Calcium, Ion 1.01 (*)    All other components within normal limits  AMMONIA  TROPONIN I (HIGH SENSITIVITY)  TROPONIN I (HIGH SENSITIVITY)    Notable for as above  EKG   EKG Interpretation Date/Time:  Sunday May 12 2023 20:23:53 EDT Ventricular Rate:  85 PR Interval:    QRS Duration:  93 QT Interval:  436 QTC Calculation: 519 R Axis:   53  Text Interpretation: Atrial fibrillation Consider anterior infarct Borderline T abnormalities, inferior leads Prolonged QT interval Confirmed by Tanda Rockers ([id number]) on 05/12/2023 9:26:58 PM         Imaging Studies ordered: I ordered imaging studies including  CTH CTAP CXR XR [facility], CTC/s I independently visualized the following imaging with scope of  interpretation limited to determining acute life threatening conditions related to emergency care; findings noted above, significant for stable imaging I independently visualized and interpreted imaging. I agree with the radiologist interpretation   Medicines ordered and prescription drug management: Meds ordered this encounter  Medications   sodium chloride 0.9 % bolus 500 mL   ziprasidone (GEODON) injection 10 mg   sterile water (preservative free) injection    Medvedev, Suzanna: cabinet override   iohexol (OMNIPAQUE) 350 MG/ML injection 75 mL    -I have reviewed the patients home medicines and have made adjustments as needed   Consultations Obtained: na   Cardiac Monitoring: The patient was maintained on a cardiac monitor.  I personally viewed and interpreted the cardiac monitored which showed an underlying rhythm of: NSR  Social Determinants of Health:  Diagnosis or treatment significantly limited by social determinants of health: SNR, frmr smoker   Reevaluation: After the interventions noted above, I reevaluated the patient and found that they have improved  Co morbidities that complicate the patient evaluation  Past Medical History:  Diagnosis Date   Hypercholesteremia    Hypertension    PAD (peripheral artery disease) (HCC)       Dispostion: Disposition decision including need for hospitalization was considered, and patient disposition pending at time of sign out.    Final Clinical Impression(s) / ED Diagnoses Final diagnoses:  None        Sloan Leiter, DO 05/13/23 [phone]

## 2023-05-12 NOTE — ED Triage Notes (Signed)
Pt BIB EMS from [facility] where staff report found pt unresponsive and started CPR on her. On fire rescue arrival pt was unresponsive as well. State pt had pulse on their arrival but had altered mental status. Speech unclear and pt moaning in pain when touching her right arm. Noted still altered on arrival to ED.

## 2023-05-12 NOTE — ED Provider Notes (Signed)
Care assumed from Dr. Wallace Cullens, patient from skilled nursing facility who had CPR done, but had return of spontaneous circulation without any other intervention. She is now complaining of abdominal pain, pending CT of abdomen and pelvis. She will need to be admitted.  CT of abdomen and pelvis shows no acute process.  Have independently viewed the images, and agree with radiologist interpretation.  I have discussed the case with Dr. Arlean Hopping of [facility], who agrees to admit the patient.  Results for orders placed or performed during the hospital encounter of 05/12/23  Comprehensive metabolic panel  Result Value Ref Range   Sodium 134 (L) 135 - 145 mmol/L   Potassium 3.4 (L) 3.5 - 5.1 mmol/L   Chloride 101 98 - 111 mmol/L   CO2 20 (L) 22 - 32 mmol/L   Glucose, Bld 124 (H) 70 - 99 mg/dL   BUN 6 (L) 8 - 23 mg/dL   Creatinine, Ser 5.63 0.44 - 1.00 mg/dL   Calcium 8.8 (L) 8.9 - 10.3 mg/dL   Total Protein 6.1 (L) 6.5 - 8.1 g/dL   Albumin 3.2 (L) 3.5 - 5.0 g/dL   AST 20 15 - 41 U/L   ALT 13 0 - 44 U/L   Alkaline Phosphatase 121 38 - 126 U/L   Total Bilirubin 0.7 0.3 - 1.2 mg/dL   GFR, Estimated >87 >56 mL/min   Anion gap 13 5 - 15  CBC with Differential  Result Value Ref Range   WBC 16.4 (H) 4.0 - 10.5 K/uL   RBC 4.26 3.87 - 5.11 MIL/uL   Hemoglobin 12.7 12.0 - 15.0 g/dL   HCT 43.3 29.5 - 18.8 %   MCV 95.5 80.0 - 100.0 fL   MCH 29.8 26.0 - 34.0 pg   MCHC 31.2 30.0 - 36.0 g/dL   RDW 41.6 60.6 - 30.1 %   Platelets 343 150 - 400 K/uL   nRBC 0.0 0.0 - 0.2 %   Neutrophils Relative % 80 %   Neutro Abs 13.0 (H) 1.7 - 7.7 K/uL   Lymphocytes Relative 12 %   Lymphs Abs 2.0 0.7 - 4.0 K/uL   Monocytes Relative 6 %   Monocytes Absolute 1.1 (H) 0.1 - 1.0 K/uL   Eosinophils Relative 1 %   Eosinophils Absolute 0.1 0.0 - 0.5 K/uL   Basophils Relative 0 %   Basophils Absolute 0.1 0.0 - 0.1 K/uL   Immature Granulocytes 1 %   Abs Immature Granulocytes 0.15 (H) 0.00 - 0.07 K/uL  Brain  natriuretic peptide  Result Value Ref Range   B Natriuretic Peptide 201.6 (H) 0.0 - 100.0 pg/mL  Ammonia  Result Value Ref Range   Ammonia 13 9 - 35 umol/L  Urinalysis, Routine w reflex microscopic -Urine, Clean Catch  Result Value Ref Range   Color, Urine STRAW (A) YELLOW   APPearance CLEAR CLEAR   Specific Gravity, Urine 1.005 1.005 - 1.030   pH 6.0 5.0 - 8.0   Glucose, UA NEGATIVE NEGATIVE mg/dL   Hgb urine dipstick NEGATIVE NEGATIVE   Bilirubin Urine NEGATIVE NEGATIVE   Ketones, ur NEGATIVE NEGATIVE mg/dL   Protein, ur NEGATIVE NEGATIVE mg/dL   Nitrite NEGATIVE NEGATIVE   Leukocytes,Ua NEGATIVE NEGATIVE  I-Stat venous blood gas, ([facility], [facility], [facility])  Result Value Ref Range   pH, Ven 7.468 (H) 7.25 - 7.43   pCO2, Ven 31.2 (L) 44 - 60 mmHg   pO2, Ven 25 (LL) 32 - 45 mmHg   Bicarbonate 22.6 20.0 -  28.0 mmol/L   TCO2 24 22 - 32 mmol/L   O2 Saturation 51 %   Acid-Base Excess 0.0 0.0 - 2.0 mmol/L   Sodium 135 135 - 145 mmol/L   Potassium 3.3 (L) 3.5 - 5.1 mmol/L   Calcium, Ion 1.01 (L) 1.15 - 1.40 mmol/L   HCT 39.0 36.0 - 46.0 %   Hemoglobin 13.3 12.0 - 15.0 g/dL   Sample type VENOUS    Comment NOTIFIED PHYSICIAN   Troponin I (High Sensitivity)  Result Value Ref Range   Troponin I (High Sensitivity) 8 <18 ng/L  Troponin I (High Sensitivity)  Result Value Ref Range   Troponin I (High Sensitivity) 8 <18 ng/L   CT ABDOMEN PELVIS W CONTRAST  Result Date: 05/13/2023 CLINICAL DATA:  Abdominal pain, acute, nonlocalized EXAM: CT ABDOMEN AND PELVIS WITH CONTRAST TECHNIQUE: Multidetector CT imaging of the abdomen and pelvis was performed using the standard protocol following bolus administration of intravenous contrast. RADIATION DOSE REDUCTION: This exam was performed according to the departmental dose-optimization program which includes automated exposure control, adjustment of the mA and/or kV according to patient size and/or use of iterative reconstruction technique. CONTRAST:  75mL  OMNIPAQUE IOHEXOL 350 MG/ML SOLN COMPARISON:  CT abdomen pelvis 04/30/2023 FINDINGS: Lower chest: Cardiomegaly.  No acute abnormality. Hepatobiliary: No focal liver abnormality. No gallstones, gallbladder wall thickening, or pericholecystic fluid. No biliary dilatation. Pancreas: No focal lesion. Normal pancreatic contour. No surrounding inflammatory changes. No main pancreatic ductal dilatation. Spleen: Normal in size without focal abnormality. Adrenals/Urinary Tract: No adrenal nodule bilaterally. Bilateral kidneys enhance symmetrically. Calcifications associated with the kidneys likely vascular. No hydronephrosis. No hydroureter. The urinary bladder is unremarkable. On delayed imaging, there is no urothelial wall thickening and there are no filling defects in the opacified portions of the bilateral collecting systems or ureters. Stomach/Bowel: Stomach is within normal limits. No evidence of bowel wall thickening or dilatation. Diffuse colonic diverticulosis. Appendix appears normal. Vascular/Lymphatic: No abdominal aorta or iliac aneurysm. Severe atherosclerotic plaque of the aorta and its branches. No abdominal, pelvic, or inguinal lymphadenopathy. Reproductive: Limited evaluation due to streak artifact. No definite mass. Coarse calcification within the uterus likely a fibroid. Other: No intraperitoneal free fluid. No intraperitoneal free gas. No organized fluid collection. Musculoskeletal: No abdominal wall hernia or abnormality. No suspicious lytic or blastic osseous lesions. No diffusely decreased bone density. Stable chronic incomplete burst fracture of the L1 vertebral body with at least 65% vertebral body height loss and 4 mm retropulsion into the central canal. IMPRESSION: 1. Colonic diverticulosis with no acute diverticulitis. 2. Likely uterine fibroid. 3. Diffusely decreased bone density. Stable chronic incomplete burst fracture of the L1 vertebral body with at least 65% vertebral body height loss and 4  mm retropulsion into the central canal. 4.  Aortic Atherosclerosis (ICD10-I70.0). Electronically Signed   By: Tish Frederickson M.D.   On: 05/13/2023 00:23   CT Head Wo Contrast  Result Date: 05/12/2023 CLINICAL DATA:  Head trauma, minor (Age >= 65y); Neck trauma (Age >= 65y) EXAM: CT HEAD WITHOUT CONTRAST CT CERVICAL SPINE WITHOUT CONTRAST TECHNIQUE: Multidetector CT imaging of the head and cervical spine was performed following the standard protocol without intravenous contrast. Multiplanar CT image reconstructions of the cervical spine were also generated. RADIATION DOSE REDUCTION: This exam was performed according to the departmental dose-optimization program which includes automated exposure control, adjustment of the mA and/or kV according to patient size and/or use of iterative reconstruction technique. COMPARISON:  CT head 09/15/2020, CT head 12/26/2022, MRI  head 08/11/2022 FINDINGS: CT HEAD FINDINGS Brain: Cerebral ventricle sizes are concordant with the degree of cerebral volume loss. Patchy and confluent areas of decreased attenuation are noted throughout the deep and periventricular white matter of the cerebral hemispheres bilaterally, compatible with chronic microvascular ischemic disease. Similar-appearing right temporoparietal and occipital encephalomalacia. Similar-appearing left basal ganglia/thalamus infarction. No evidence of large-territorial acute infarction. No parenchymal hemorrhage. No mass lesion. No extra-axial collection. No mass effect or midline shift. No hydrocephalus. Basilar cisterns are patent. Vascular: No hyperdense vessel. Skull: No acute fracture or focal lesion. Sinuses/Orbits: Paranasal sinuses and mastoid air cells are clear. The orbits are unremarkable. Other: None. CT CERVICAL SPINE FINDINGS Alignment: Normal. Skull base and vertebrae: Diffusely decreased bone density. Multilevel mild degenerative changes spine. Multilevel posterior disc osteophyte complex formation most  prominent at the C5-C6 level. No severe osseous neural foraminal or central canal stenosis. No acute fracture. No aggressive appearing focal osseous lesion or focal pathologic process. Soft tissues and spinal canal: No prevertebral fluid or swelling. No visible canal hematoma. Upper chest: Emphysematous changes. Other: Atherosclerotic plaque of the aortic arch and its main branches. IMPRESSION: 1. No acute intracranial abnormality in a patient with chronic similar-appearing cerebral infarctions and chronic microvascular ischemic changes. 2. No acute displaced fracture or traumatic listhesis of the cervical spine. 3. Diffusely decreased bone density. 4. Aortic Atherosclerosis (ICD10-I70.0) and Emphysema (ICD10-J43.9). Electronically Signed   By: Tish Frederickson M.D.   On: 05/12/2023 22:01   CT Cervical Spine Wo Contrast  Result Date: 05/12/2023 CLINICAL DATA:  Head trauma, minor (Age >= 65y); Neck trauma (Age >= 65y) EXAM: CT HEAD WITHOUT CONTRAST CT CERVICAL SPINE WITHOUT CONTRAST TECHNIQUE: Multidetector CT imaging of the head and cervical spine was performed following the standard protocol without intravenous contrast. Multiplanar CT image reconstructions of the cervical spine were also generated. RADIATION DOSE REDUCTION: This exam was performed according to the departmental dose-optimization program which includes automated exposure control, adjustment of the mA and/or kV according to patient size and/or use of iterative reconstruction technique. COMPARISON:  CT head 09/15/2020, CT head 12/26/2022, MRI head 08/11/2022 FINDINGS: CT HEAD FINDINGS Brain: Cerebral ventricle sizes are concordant with the degree of cerebral volume loss. Patchy and confluent areas of decreased attenuation are noted throughout the deep and periventricular white matter of the cerebral hemispheres bilaterally, compatible with chronic microvascular ischemic disease. Similar-appearing right temporoparietal and occipital  encephalomalacia. Similar-appearing left basal ganglia/thalamus infarction. No evidence of large-territorial acute infarction. No parenchymal hemorrhage. No mass lesion. No extra-axial collection. No mass effect or midline shift. No hydrocephalus. Basilar cisterns are patent. Vascular: No hyperdense vessel. Skull: No acute fracture or focal lesion. Sinuses/Orbits: Paranasal sinuses and mastoid air cells are clear. The orbits are unremarkable. Other: None. CT CERVICAL SPINE FINDINGS Alignment: Normal. Skull base and vertebrae: Diffusely decreased bone density. Multilevel mild degenerative changes spine. Multilevel posterior disc osteophyte complex formation most prominent at the C5-C6 level. No severe osseous neural foraminal or central canal stenosis. No acute fracture. No aggressive appearing focal osseous lesion or focal pathologic process. Soft tissues and spinal canal: No prevertebral fluid or swelling. No visible canal hematoma. Upper chest: Emphysematous changes. Other: Atherosclerotic plaque of the aortic arch and its main branches. IMPRESSION: 1. No acute intracranial abnormality in a patient with chronic similar-appearing cerebral infarctions and chronic microvascular ischemic changes. 2. No acute displaced fracture or traumatic listhesis of the cervical spine. 3. Diffusely decreased bone density. 4. Aortic Atherosclerosis (ICD10-I70.0) and Emphysema (ICD10-J43.9). Electronically Signed   By: Blanchie Serve  Tessie Fass M.D.   On: 05/12/2023 22:01   DG Pelvis Portable  Result Date: 05/12/2023 CLINICAL DATA:  ?post cpr EXAM: PORTABLE PELVIS 1-2 VIEWS COMPARISON:  CT abdomen pelvis 04/30/2023 FINDINGS: Limited evaluation due to overlapping osseous structures and overlying soft tissues. There is no evidence of pelvic fracture or diastasis. No pelvic bone lesions are seen. Degenerative changes lower visualized lumbar spine. Vascular calcifications. IMPRESSION: 1. Negative for acute traumatic injury. Limited evaluation  due to overlapping osseous structures and overlying soft tissues. 2.  Aortic Atherosclerosis (ICD10-I70.0). Electronically Signed   By: Tish Frederickson M.D.   On: 05/12/2023 20:29   DG Chest Port 1 View  Result Date: 05/12/2023 CLINICAL DATA:  ?post cpr EXAM: PORTABLE CHEST 1 VIEW.  Patient is rotated. COMPARISON:  CT C-spine 03/22/2022, chest x-ray 12/18/2022 FINDINGS: The heart and mediastinal contours are unchanged. Aortic calcification. Biapical pleural/pulmonary scarring. Biapical cystic changes and emphysematous changes. No focal consolidation. Chronic coarsened markings with no overt pulmonary edema. No pleural effusion. No pneumothorax. No acute osseous abnormality.  Old healed left clavicular fracture. IMPRESSION: 1. No active disease. 2. Aortic Atherosclerosis (ICD10-I70.0) and Emphysema (ICD10-J43.9). Electronically Signed   By: Tish Frederickson M.D.   On: 05/12/2023 20:28   CT ABDOMEN PELVIS W CONTRAST  Result Date: 04/30/2023 CLINICAL DATA:  Abdominal pain.  Blood in stool, possible GI bleed. EXAM: CT ABDOMEN AND PELVIS WITH CONTRAST TECHNIQUE: Multidetector CT imaging of the abdomen and pelvis was performed using the standard protocol following bolus administration of intravenous contrast. RADIATION DOSE REDUCTION: This exam was performed according to the departmental dose-optimization program which includes automated exposure control, adjustment of the mA and/or kV according to patient size and/or use of iterative reconstruction technique. CONTRAST:  75mL OMNIPAQUE IOHEXOL 350 MG/ML SOLN COMPARISON:  09/04/2019. FINDINGS: Lower chest: The heart is enlarged and three-vessel coronary artery calcifications are noted. There is atherosclerotic calcification of the aorta with aneurysmal dilatation of the ascending aorta measuring 4.9 cm. Mild airspace disease is noted in the left lower lobe. Hepatobiliary: No focal abnormality in the liver. No biliary ductal dilatation. The gallbladder is without  stones. Pancreas: Unremarkable. No pancreatic ductal dilatation or surrounding inflammatory changes. Spleen: Normal in size without focal abnormality. Adrenals/Urinary Tract: The adrenal glands are within normal limits. The kidneys enhance symmetrically. Renal cortical scarring is noted on the left. Calcifications are noted in the kidneys bilaterally, possible vascular calcifications versus renal calculi. No hydronephrosis. Mild bladder wall thickening is noted. Stomach/Bowel: Stomach is within normal limits. Appendix appears normal. No bowel obstruction, free air or pneumatosis. Scattered diverticula are present along the sigmoid colon without evidence of diverticulitis. There is diffuse colonic wall thickening extending into the rectum. Vascular/Lymphatic: Aortic atherosclerosis. No enlarged abdominal or pelvic lymph nodes. Reproductive: Coarse calcifications are noted in the uterus likely representing degenerating fibroid. No adnexal mass. Other: There is pelvic floor laxity with likely rectal and vaginal prolapse. No abdominopelvic ascites. Musculoskeletal: There is a compression deformity at L1 with loss of vertebral body height approximately 60% with a slightly retropulsed element a stable compression deformity is noted in the superior endplate at T11. Degenerative changes are noted in the thoracolumbar spine. IMPRESSION: 1. Diffuse colonic wall thickening, compatible with colitis. 2. Compression deformity at L1 with loss of vertebral body height of 60% with retropulsed element, indeterminate in age. 3. Patchy airspace disease in the left lower lobe, possible atelectasis or infiltrate. 4. Mild diffuse bladder wall thickening, possible infectious or inflammatory cystitis. 5. Aortic atherosclerosis with aneurysmal dilatation of  the ascending aorta measuring 4.9 cm. Ascending thoracic aortic aneurysm. Recommend semi-annual imaging followup by CTA or MRA and referral to cardiothoracic surgery if not already  obtained. This recommendation follows 2010 ACCF/AHA/AATS/ACR/ASA/SCA/SCAI/SIR/STS/SVM Guidelines for the Diagnosis and Management of Patients With Thoracic Aortic Disease. Circulation. 2010; 121: Z610-R604. Aortic aneurysm NOS (ICD10-I71.9) 6. Cardiomegaly with coronary artery calcifications. 7. Remaining incidental findings as described above. Electronically Signed   By: Thornell Sartorius M.D.   On: 04/30/2023 20:56      Dione Booze, MD 05/13/23 [id number]

## 2023-05-12 NOTE — ED Notes (Signed)
Pt resting in bed at this time, respirations regular and unlabored. Noted to have wet brief d/t incontinent episode. Changed and cleansed patient at this time. Pt awakens on verbal stimuli at this time. Denies any needs. Made comfortable in bed. Remains on the cardiac monitor. On RA at this time.

## 2023-05-12 NOTE — ED Notes (Signed)
Patient transported to CT 

## 2023-05-12 NOTE — ED Notes (Signed)
Patient back from imaging at this time

## 2023-05-13 ENCOUNTER — Other Ambulatory Visit: Payer: Self-pay

## 2023-05-13 ENCOUNTER — Encounter (HOSPITAL_COMMUNITY): Payer: Self-pay | Admitting: Internal Medicine

## 2023-05-13 ENCOUNTER — Observation Stay (HOSPITAL_COMMUNITY): Payer: Medicare Other

## 2023-05-13 DIAGNOSIS — I10 Essential (primary) hypertension: Secondary | ICD-10-CM | POA: Diagnosis not present

## 2023-05-13 DIAGNOSIS — I480 Paroxysmal atrial fibrillation: Secondary | ICD-10-CM

## 2023-05-13 DIAGNOSIS — R9431 Abnormal electrocardiogram [ECG] [EKG]: Secondary | ICD-10-CM | POA: Diagnosis present

## 2023-05-13 DIAGNOSIS — E782 Mixed hyperlipidemia: Secondary | ICD-10-CM

## 2023-05-13 DIAGNOSIS — R55 Syncope and collapse: Secondary | ICD-10-CM

## 2023-05-13 DIAGNOSIS — E871 Hypo-osmolality and hyponatremia: Secondary | ICD-10-CM

## 2023-05-13 DIAGNOSIS — Z87898 Personal history of other specified conditions: Secondary | ICD-10-CM

## 2023-05-13 DIAGNOSIS — D72829 Elevated white blood cell count, unspecified: Secondary | ICD-10-CM

## 2023-05-13 DIAGNOSIS — G9340 Encephalopathy, unspecified: Secondary | ICD-10-CM | POA: Diagnosis present

## 2023-05-13 DIAGNOSIS — E876 Hypokalemia: Secondary | ICD-10-CM | POA: Diagnosis not present

## 2023-05-13 LAB — COMPREHENSIVE METABOLIC PANEL
ALT: 13 U/L (ref 0–44)
AST: 22 U/L (ref 15–41)
Albumin: 3.3 g/dL — ABNORMAL LOW (ref 3.5–5.0)
Alkaline Phosphatase: 139 U/L — ABNORMAL HIGH (ref 38–126)
Anion gap: 10 (ref 5–15)
BUN: 5 mg/dL — ABNORMAL LOW (ref 8–23)
CO2: 25 mmol/L (ref 22–32)
Calcium: 9.1 mg/dL (ref 8.9–10.3)
Chloride: 99 mmol/L (ref 98–111)
Creatinine, Ser: 0.66 mg/dL (ref 0.44–1.00)
GFR, Estimated: >60 mL/min (ref >60)
Glucose, Bld: 106 mg/dL — ABNORMAL HIGH (ref 70–99)
Potassium: 3.7 mmol/L (ref 3.5–5.1)
Sodium: 134 mmol/L — ABNORMAL LOW (ref 135–145)
Total Bilirubin: 0.6 mg/dL (ref 0.3–1.2)
Total Protein: 6.9 g/dL (ref 6.5–8.1)

## 2023-05-13 LAB — CBC WITH DIFFERENTIAL/PLATELET
Abs Immature Granulocytes: 0.08 10*3/uL — ABNORMAL HIGH (ref 0.00–0.07)
Basophils Absolute: 0.1 10*3/uL (ref 0.0–0.1)
Basophils Relative: 0 %
Eosinophils Absolute: 0 10*3/uL (ref 0.0–0.5)
Eosinophils Relative: 0 %
HCT: 43.4 % (ref 36.0–46.0)
Hemoglobin: 14.3 g/dL (ref 12.0–15.0)
Immature Granulocytes: 1 %
Lymphocytes Relative: 10 %
Lymphs Abs: 1.6 10*3/uL (ref 0.7–4.0)
MCH: 29.8 pg (ref 26.0–34.0)
MCHC: 32.9 g/dL (ref 30.0–36.0)
MCV: 90.4 fL (ref 80.0–100.0)
Monocytes Absolute: 0.9 10*3/uL (ref 0.1–1.0)
Monocytes Relative: 6 %
Neutro Abs: 13.4 10*3/uL — ABNORMAL HIGH (ref 1.7–7.7)
Neutrophils Relative %: 83 %
Platelets: 389 10*3/uL (ref 150–400)
RBC: 4.8 MIL/uL (ref 3.87–5.11)
RDW: 12 % (ref 11.5–15.5)
WBC: 16.1 10*3/uL — ABNORMAL HIGH (ref 4.0–10.5)
nRBC: 0 % (ref 0.0–0.2)

## 2023-05-13 LAB — ECHOCARDIOGRAM COMPLETE
AR max vel: 2.08 cm2
AV Area VTI: 2.61 cm2
AV Area mean vel: 2.07 cm2
AV Mean grad: 1 mmHg
AV Peak grad: 2.8 mmHg
AV Vena cont: 0.4 cm
Ao pk vel: 0.83 m/s
Area-P 1/2: 8.16 cm2
Height: 64 in
S' Lateral: 2 cm
Weight: 1834.23 oz

## 2023-05-13 LAB — TROPONIN I (HIGH SENSITIVITY): Troponin I (High Sensitivity): 10 ng/L (ref <18)

## 2023-05-13 LAB — GLUCOSE, CAPILLARY
Glucose-Capillary: 91 mg/dL (ref 70–99)
Glucose-Capillary: 118 mg/dL — ABNORMAL HIGH (ref 70–99)

## 2023-05-13 LAB — RAPID URINE DRUG SCREEN, HOSP PERFORMED
Amphetamines: NOT DETECTED
Barbiturates: NOT DETECTED
Benzodiazepines: NOT DETECTED
Cocaine: NOT DETECTED
Opiates: NOT DETECTED
Tetrahydrocannabinol: NOT DETECTED

## 2023-05-13 LAB — TSH: TSH: 7.509 u[IU]/mL — ABNORMAL HIGH (ref 0.350–4.500)

## 2023-05-13 MED ORDER — MELATONIN 3 MG PO TABS: 3.0000 mg | ORAL_TABLET | Freq: Every evening | ORAL | Status: DC | PRN

## 2023-05-13 MED ORDER — PRAVASTATIN SODIUM 40 MG PO TABS
40.0000 mg | ORAL_TABLET | Freq: Every day | ORAL | Status: DC
  Administered 2023-05-13 – 2023-05-15 (×3): 40 mg via ORAL
  Filled 2023-05-13 (×3): qty 1

## 2023-05-13 MED ORDER — ACETAMINOPHEN 650 MG RE SUPP: 650.0000 mg | Freq: Four times a day (QID) | RECTAL | Status: DC | PRN

## 2023-05-13 MED ORDER — WHITE PETROLATUM EX OINT
TOPICAL_OINTMENT | CUTANEOUS | Status: DC | PRN
  Filled 2023-05-13: qty 28.35

## 2023-05-13 MED ORDER — METOPROLOL TARTRATE 50 MG PO TABS
50.0000 mg | ORAL_TABLET | Freq: Two times a day (BID) | ORAL | Status: DC
  Administered 2023-05-13 – 2023-05-15 (×5): 50 mg via ORAL
  Filled 2023-05-13: qty 2
  Filled 2023-05-13 (×4): qty 1

## 2023-05-13 MED ORDER — APIXABAN 2.5 MG PO TABS
2.5000 mg | ORAL_TABLET | Freq: Two times a day (BID) | ORAL | Status: DC
  Administered 2023-05-13 – 2023-05-15 (×5): 2.5 mg via ORAL
  Filled 2023-05-13 (×5): qty 1

## 2023-05-13 MED ORDER — POTASSIUM CHLORIDE 10 MEQ/100ML IV SOLN
10.0000 meq | INTRAVENOUS | Status: AC
  Administered 2023-05-13 (×4): 10 meq via INTRAVENOUS
  Filled 2023-05-13 (×4): qty 100

## 2023-05-13 MED ORDER — ONDANSETRON HCL 4 MG/2ML IJ SOLN: 4.0000 mg | Freq: Four times a day (QID) | INTRAMUSCULAR | Status: DC | PRN

## 2023-05-13 MED ORDER — LEVETIRACETAM 500 MG PO TABS
500.0000 mg | ORAL_TABLET | Freq: Two times a day (BID) | ORAL | Status: DC
  Administered 2023-05-13 – 2023-05-15 (×5): 500 mg via ORAL
  Filled 2023-05-13 (×5): qty 1

## 2023-05-13 MED ORDER — ACETAMINOPHEN 325 MG PO TABS: 650.0000 mg | ORAL_TABLET | Freq: Four times a day (QID) | ORAL | Status: DC | PRN

## 2023-05-13 NOTE — Progress Notes (Signed)
Patient received from the ER via stretcher; patient is alert to self; bed low, locked and alarmed for fall prevention.

## 2023-05-13 NOTE — ED Notes (Addendum)
Pt noted to have large BM in brief on arrival to ED at this time. Cleansed and changed pt and provided with clean brief and chux pad at this time. Placed into clean gown.

## 2023-05-13 NOTE — Progress Notes (Signed)
*  PRELIMINARY RESULTS* Echocardiogram 2D Echocardiogram has been performed.  Kendra Charles 05/13/2023, 2:36 PM

## 2023-05-13 NOTE — ED Notes (Signed)
Pt had soaking wet brief. Cleansed and changed patient at this time. Provided with clean chux pad and brief. Provided with warm blankets.

## 2023-05-13 NOTE — ED Notes (Signed)
ED TO INPATIENT HANDOFF REPORT  ED Nurse Name and Phone #: Britt Boozer 48  S Name/Age/Gender Kendra Charles 82 y.o. female Room/Bed: 011C/011C  Code Status   Code Status: Full Code  Home/SNF/Other Home Patient oriented to: self Is this baseline? No   Triage Complete: Triage complete  Chief Complaint Acute encephalopathy [G93.40]  Triage Note Pt BIB EMS from [facility] where staff report found pt unresponsive and started CPR on her. On fire rescue arrival pt was unresponsive as well. State pt had pulse on their arrival but had altered mental status. Speech unclear and pt moaning in pain when touching her right arm. Noted still altered on arrival to ED.    Allergies No Known Allergies  Level of Care/Admitting Diagnosis ED Disposition     ED Disposition  Admit   Condition  --   Comment  Hospital Area: [facility] [[id number]]  Level of Care: Progressive [102]  Admit to Progressive based on following criteria: MULTISYSTEM THREATS such as stable sepsis, metabolic/electrolyte imbalance with or without encephalopathy that is responding to early treatment.  May place patient in observation at [facility] or [facility] if equivalent level of care is available:: No  Covid Evaluation: Asymptomatic - no recent exposure (last 10 days) testing not required  Diagnosis: Acute encephalopathy [161096]  Admitting Physician: Angie Fava [[id number]]  Attending Physician: Angie Fava [[id number]]          B Medical/Surgery History Past Medical History:  Diagnosis Date   Hypercholesteremia    Hypertension    PAD (peripheral artery disease) (HCC)    Paroxysmal atrial fibrillation (HCC)    Seizures (HCC)    History reviewed. No pertinent surgical history.   A IV Location/Drains/Wounds Patient Lines/Drains/Airways Status     Active Line/Drains/Airways     Name Placement date Placement time Site Days   Peripheral IV 05/13/23 20 G Anterior;Right Forearm  05/13/23  0508  Forearm  less than 1            Intake/Output Last 24 hours No intake or output data in the 24 hours ending 05/13/23 1437  Labs/Imaging Results for orders placed or performed during the hospital encounter of 05/12/23 (from the past 48 hour(s))  Troponin I (High Sensitivity)     Status: None   Collection Time: 05/12/23  8:26 PM  Result Value Ref Range   Troponin I (High Sensitivity) 8 <18 ng/L    Comment: (NOTE) Elevated high sensitivity troponin I (hsTnI) values and significant  changes across serial measurements may suggest ACS but many other  chronic and acute conditions are known to elevate hsTnI results.  Refer to the "Links" section for chest pain algorithms and additional  guidance. Performed at [facility], [address], [place], [place] [address]   Comprehensive metabolic panel     Status: Abnormal   Collection Time: 05/12/23  8:26 PM  Result Value Ref Range   Sodium 134 (L) 135 - 145 mmol/L   Potassium 3.4 (L) 3.5 - 5.1 mmol/L   Chloride 101 98 - 111 mmol/L   CO2 20 (L) 22 - 32 mmol/L   Glucose, Bld 124 (H) 70 - 99 mg/dL    Comment: Glucose reference range applies only to samples taken after fasting for at least 8 hours.   BUN 6 (L) 8 - 23 mg/dL   Creatinine, Ser 1.30 0.44 - 1.00 mg/dL   Calcium 8.8 (L) 8.9 - 10.3 mg/dL   Total Protein 6.1 (L)  6.5 - 8.1 g/dL   Albumin 3.2 (L) 3.5 - 5.0 g/dL   AST 20 15 - 41 U/L   ALT 13 0 - 44 U/L   Alkaline Phosphatase 121 38 - 126 U/L   Total Bilirubin 0.7 0.3 - 1.2 mg/dL   GFR, Estimated >66 >06 mL/min    Comment: (NOTE) Calculated using the CKD-EPI Creatinine Equation (2021)    Anion gap 13 5 - 15    Comment: Performed at [facility], [address] [place], [place] [address]  CBC with Differential     Status: Abnormal   Collection Time: 05/12/23  8:26 PM  Result Value Ref Range   WBC 16.4 (H) 4.0 - 10.5 K/uL   RBC 4.26 3.87 - 5.11 MIL/uL   Hemoglobin 12.7 12.0 - 15.0 g/dL   HCT  10.9 32.3 - 55.7 %   MCV 95.5 80.0 - 100.0 fL   MCH 29.8 26.0 - 34.0 pg   MCHC 31.2 30.0 - 36.0 g/dL   RDW 32.2 02.5 - 42.7 %   Platelets 343 150 - 400 K/uL   nRBC 0.0 0.0 - 0.2 %   Neutrophils Relative % 80 %   Neutro Abs 13.0 (H) 1.7 - 7.7 K/uL   Lymphocytes Relative 12 %   Lymphs Abs 2.0 0.7 - 4.0 K/uL   Monocytes Relative 6 %   Monocytes Absolute 1.1 (H) 0.1 - 1.0 K/uL   Eosinophils Relative 1 %   Eosinophils Absolute 0.1 0.0 - 0.5 K/uL   Basophils Relative 0 %   Basophils Absolute 0.1 0.0 - 0.1 K/uL   Immature Granulocytes 1 %   Abs Immature Granulocytes 0.15 (H) 0.00 - 0.07 K/uL    Comment: Performed at [facility], [address], [place], [place] [address]  Brain natriuretic peptide     Status: Abnormal   Collection Time: 05/12/23  8:26 PM  Result Value Ref Range   B Natriuretic Peptide 201.6 (H) 0.0 - 100.0 pg/mL    Comment: Performed at [facility], [address], [place], [place] [address]  Ammonia     Status: None   Collection Time: 05/12/23  8:26 PM  Result Value Ref Range   Ammonia 13 9 - 35 umol/L    Comment: Performed at [facility], [address], [place], [place] [address]  I-Stat venous blood gas, [facility] ED, [facility], [facility])     Status: Abnormal   Collection Time: 05/12/23  8:32 PM  Result Value Ref Range   pH, Ven 7.468 (H) 7.25 - 7.43   pCO2, Ven 31.2 (L) 44 - 60 mmHg   pO2, Ven 25 (LL) 32 - 45 mmHg   Bicarbonate 22.6 20.0 - 28.0 mmol/L   TCO2 24 22 - 32 mmol/L   O2 Saturation 51 %   Acid-Base Excess 0.0 0.0 - 2.0 mmol/L   Sodium 135 135 - 145 mmol/L   Potassium 3.3 (L) 3.5 - 5.1 mmol/L   Calcium, Ion 1.01 (L) 1.15 - 1.40 mmol/L   HCT 39.0 36.0 - 46.0 %   Hemoglobin 13.3 12.0 - 15.0 g/dL   Sample type VENOUS    Comment NOTIFIED PHYSICIAN   Urinalysis, Routine w reflex microscopic -Urine, Clean Catch     Status: Abnormal   Collection Time: 05/12/23 10:17 PM  Result Value Ref Range   Color, Urine STRAW (A) YELLOW   APPearance CLEAR  CLEAR   Specific Gravity, Urine 1.005 1.005 - 1.030  pH 6.0 5.0 - 8.0   Glucose, UA NEGATIVE NEGATIVE mg/dL   Hgb urine dipstick NEGATIVE NEGATIVE   Bilirubin Urine NEGATIVE NEGATIVE   Ketones, ur NEGATIVE NEGATIVE mg/dL   Protein, ur NEGATIVE NEGATIVE mg/dL   Nitrite NEGATIVE NEGATIVE   Leukocytes,Ua NEGATIVE NEGATIVE    Comment: Performed at [facility], [address], [place], [place] [address]  Rapid urine drug screen (hospital performed)     Status: None   Collection Time: 05/12/23 10:17 PM  Result Value Ref Range   Opiates NONE DETECTED NONE DETECTED   Cocaine NONE DETECTED NONE DETECTED   Benzodiazepines NONE DETECTED NONE DETECTED   Amphetamines NONE DETECTED NONE DETECTED   Tetrahydrocannabinol NONE DETECTED NONE DETECTED   Barbiturates NONE DETECTED NONE DETECTED    Comment: (NOTE) DRUG SCREEN FOR MEDICAL PURPOSES ONLY.  IF CONFIRMATION IS NEEDED FOR ANY PURPOSE, NOTIFY LAB WITHIN 5 DAYS.  LOWEST DETECTABLE LIMITS FOR URINE DRUG SCREEN Drug Class                     Cutoff (ng/mL) Amphetamine and metabolites    1000 Barbiturate and metabolites    200 Benzodiazepine                 200 Opiates and metabolites        300 Cocaine and metabolites        300 THC                            50 Performed at [facility], [address] [place] [address], [place], [place] [address]   Troponin I (High Sensitivity)     Status: None   Collection Time: 05/12/23 10:26 PM  Result Value Ref Range   Troponin I (High Sensitivity) 8 <18 ng/L    Comment: (NOTE) Elevated high sensitivity troponin I (hsTnI) values and significant  changes across serial measurements may suggest ACS but many other  chronic and acute conditions are known to elevate hsTnI results.  Refer to the "Links" section for chest pain algorithms and additional  guidance. Performed at [facility], [address] [place], [place] [address]   TSH     Status: Abnormal   Collection Time: 05/12/23 10:26 PM   Result Value Ref Range   TSH 7.509 (H) 0.350 - 4.500 uIU/mL    Comment: Performed by a 3rd Generation assay with a functional sensitivity of <=0.01 uIU/mL. Performed at [facility], [address] [place], [place] [address]   Magnesium     Status: None   Collection Time: 05/12/23 10:26 PM  Result Value Ref Range   Magnesium 1.7 1.7 - 2.4 mg/dL    Comment: Performed at [facility], [address], [place], [place] [address]  CBC with Differential/Platelet     Status: Abnormal   Collection Time: 05/13/23  5:05 AM  Result Value Ref Range   WBC 16.1 (H) 4.0 - 10.5 K/uL   RBC 4.80 3.87 - 5.11 MIL/uL   Hemoglobin 14.3 12.0 - 15.0 g/dL   HCT 07.3 71.0 - 62.6 %   MCV 90.4 80.0 - 100.0 fL   MCH 29.8 26.0 - 34.0 pg   MCHC 32.9 30.0 - 36.0 g/dL   RDW 94.8 54.6 - 27.0 %   Platelets 389 150 - 400 K/uL   nRBC 0.0 0.0 - 0.2 %   Neutrophils Relative % 83 %  Neutro Abs 13.4 (H) 1.7 - 7.7 K/uL   Lymphocytes Relative 10 %   Lymphs Abs 1.6 0.7 - 4.0 K/uL   Monocytes Relative 6 %   Monocytes Absolute 0.9 0.1 - 1.0 K/uL   Eosinophils Relative 0 %   Eosinophils Absolute 0.0 0.0 - 0.5 K/uL   Basophils Relative 0 %   Basophils Absolute 0.1 0.0 - 0.1 K/uL   Immature Granulocytes 1 %   Abs Immature Granulocytes 0.08 (H) 0.00 - 0.07 K/uL    Comment: Performed at [facility], [address] [place], [place] [address]  Comprehensive metabolic panel     Status: Abnormal   Collection Time: 05/13/23  5:05 AM  Result Value Ref Range   Sodium 134 (L) 135 - 145 mmol/L   Potassium 3.7 3.5 - 5.1 mmol/L   Chloride 99 98 - 111 mmol/L   CO2 25 22 - 32 mmol/L   Glucose, Bld 106 (H) 70 - 99 mg/dL    Comment: Glucose reference range applies only to samples taken after fasting for at least 8 hours.   BUN 5 (L) 8 - 23 mg/dL   Creatinine, Ser 6.43 0.44 - 1.00 mg/dL   Calcium 9.1 8.9 - 32.9 mg/dL   Total Protein 6.9 6.5 - 8.1 g/dL   Albumin 3.3 (L) 3.5 - 5.0 g/dL   AST 22 15 - 41 U/L    ALT 13 0 - 44 U/L   Alkaline Phosphatase 139 (H) 38 - 126 U/L   Total Bilirubin 0.6 0.3 - 1.2 mg/dL   GFR, Estimated >51 >88 mL/min    Comment: (NOTE) Calculated using the CKD-EPI Creatinine Equation (2021)    Anion gap 10 5 - 15    Comment: Performed at [facility], [address] [place], [place] [address]  Troponin I (High Sensitivity)     Status: None   Collection Time: 05/13/23  5:05 AM  Result Value Ref Range   Troponin I (High Sensitivity) 10 <18 ng/L    Comment: (NOTE) Elevated high sensitivity troponin I (hsTnI) values and significant  changes across serial measurements may suggest ACS but many other  chronic and acute conditions are known to elevate hsTnI results.  Refer to the "Links" section for chest pain algorithms and additional  guidance. Performed at [facility], [address], [place], [place] [address]    CT ABDOMEN PELVIS W CONTRAST  Result Date: 05/13/2023 CLINICAL DATA:  Abdominal pain, acute, nonlocalized EXAM: CT ABDOMEN AND PELVIS WITH CONTRAST TECHNIQUE: Multidetector CT imaging of the abdomen and pelvis was performed using the standard protocol following bolus administration of intravenous contrast. RADIATION DOSE REDUCTION: This exam was performed according to the departmental dose-optimization program which includes automated exposure control, adjustment of the mA and/or kV according to patient size and/or use of iterative reconstruction technique. CONTRAST:  75mL OMNIPAQUE IOHEXOL 350 MG/ML SOLN COMPARISON:  CT abdomen pelvis 04/30/2023 FINDINGS: Lower chest: Cardiomegaly.  No acute abnormality. Hepatobiliary: No focal liver abnormality. No gallstones, gallbladder wall thickening, or pericholecystic fluid. No biliary dilatation. Pancreas: No focal lesion. Normal pancreatic contour. No surrounding inflammatory changes. No main pancreatic ductal dilatation. Spleen: Normal in size without focal abnormality. Adrenals/Urinary Tract: No adrenal nodule  bilaterally. Bilateral kidneys enhance symmetrically. Calcifications associated with the kidneys likely vascular. No hydronephrosis. No hydroureter. The urinary bladder is unremarkable. On delayed imaging, there is no urothelial wall thickening and there are no filling defects in the opacified portions of the bilateral collecting  systems or ureters. Stomach/Bowel: Stomach is within normal limits. No evidence of bowel wall thickening or dilatation. Diffuse colonic diverticulosis. Appendix appears normal. Vascular/Lymphatic: No abdominal aorta or iliac aneurysm. Severe atherosclerotic plaque of the aorta and its branches. No abdominal, pelvic, or inguinal lymphadenopathy. Reproductive: Limited evaluation due to streak artifact. No definite mass. Coarse calcification within the uterus likely a fibroid. Other: No intraperitoneal free fluid. No intraperitoneal free gas. No organized fluid collection. Musculoskeletal: No abdominal wall hernia or abnormality. No suspicious lytic or blastic osseous lesions. No diffusely decreased bone density. Stable chronic incomplete burst fracture of the L1 vertebral body with at least 65% vertebral body height loss and 4 mm retropulsion into the central canal. IMPRESSION: 1. Colonic diverticulosis with no acute diverticulitis. 2. Likely uterine fibroid. 3. Diffusely decreased bone density. Stable chronic incomplete burst fracture of the L1 vertebral body with at least 65% vertebral body height loss and 4 mm retropulsion into the central canal. 4.  Aortic Atherosclerosis (ICD10-I70.0). Electronically Signed   By: Tish Frederickson M.D.   On: 05/13/2023 00:23   CT Head Wo Contrast  Result Date: 05/12/2023 CLINICAL DATA:  Head trauma, minor (Age >= 65y); Neck trauma (Age >= 65y) EXAM: CT HEAD WITHOUT CONTRAST CT CERVICAL SPINE WITHOUT CONTRAST TECHNIQUE: Multidetector CT imaging of the head and cervical spine was performed following the standard protocol without intravenous contrast.  Multiplanar CT image reconstructions of the cervical spine were also generated. RADIATION DOSE REDUCTION: This exam was performed according to the departmental dose-optimization program which includes automated exposure control, adjustment of the mA and/or kV according to patient size and/or use of iterative reconstruction technique. COMPARISON:  CT head 09/15/2020, CT head 12/26/2022, MRI head 08/11/2022 FINDINGS: CT HEAD FINDINGS Brain: Cerebral ventricle sizes are concordant with the degree of cerebral volume loss. Patchy and confluent areas of decreased attenuation are noted throughout the deep and periventricular white matter of the cerebral hemispheres bilaterally, compatible with chronic microvascular ischemic disease. Similar-appearing right temporoparietal and occipital encephalomalacia. Similar-appearing left basal ganglia/thalamus infarction. No evidence of large-territorial acute infarction. No parenchymal hemorrhage. No mass lesion. No extra-axial collection. No mass effect or midline shift. No hydrocephalus. Basilar cisterns are patent. Vascular: No hyperdense vessel. Skull: No acute fracture or focal lesion. Sinuses/Orbits: Paranasal sinuses and mastoid air cells are clear. The orbits are unremarkable. Other: None. CT CERVICAL SPINE FINDINGS Alignment: Normal. Skull base and vertebrae: Diffusely decreased bone density. Multilevel mild degenerative changes spine. Multilevel posterior disc osteophyte complex formation most prominent at the C5-C6 level. No severe osseous neural foraminal or central canal stenosis. No acute fracture. No aggressive appearing focal osseous lesion or focal pathologic process. Soft tissues and spinal canal: No prevertebral fluid or swelling. No visible canal hematoma. Upper chest: Emphysematous changes. Other: Atherosclerotic plaque of the aortic arch and its main branches. IMPRESSION: 1. No acute intracranial abnormality in a patient with chronic similar-appearing cerebral  infarctions and chronic microvascular ischemic changes. 2. No acute displaced fracture or traumatic listhesis of the cervical spine. 3. Diffusely decreased bone density. 4. Aortic Atherosclerosis (ICD10-I70.0) and Emphysema (ICD10-J43.9). Electronically Signed   By: Tish Frederickson M.D.   On: 05/12/2023 22:01   CT Cervical Spine Wo Contrast  Result Date: 05/12/2023 CLINICAL DATA:  Head trauma, minor (Age >= 65y); Neck trauma (Age >= 65y) EXAM: CT HEAD WITHOUT CONTRAST CT CERVICAL SPINE WITHOUT CONTRAST TECHNIQUE: Multidetector CT imaging of the head and cervical spine was performed following the standard protocol without intravenous contrast. Multiplanar CT image reconstructions of  the cervical spine were also generated. RADIATION DOSE REDUCTION: This exam was performed according to the departmental dose-optimization program which includes automated exposure control, adjustment of the mA and/or kV according to patient size and/or use of iterative reconstruction technique. COMPARISON:  CT head 09/15/2020, CT head 12/26/2022, MRI head 08/11/2022 FINDINGS: CT HEAD FINDINGS Brain: Cerebral ventricle sizes are concordant with the degree of cerebral volume loss. Patchy and confluent areas of decreased attenuation are noted throughout the deep and periventricular white matter of the cerebral hemispheres bilaterally, compatible with chronic microvascular ischemic disease. Similar-appearing right temporoparietal and occipital encephalomalacia. Similar-appearing left basal ganglia/thalamus infarction. No evidence of large-territorial acute infarction. No parenchymal hemorrhage. No mass lesion. No extra-axial collection. No mass effect or midline shift. No hydrocephalus. Basilar cisterns are patent. Vascular: No hyperdense vessel. Skull: No acute fracture or focal lesion. Sinuses/Orbits: Paranasal sinuses and mastoid air cells are clear. The orbits are unremarkable. Other: None. CT CERVICAL SPINE FINDINGS Alignment:  Normal. Skull base and vertebrae: Diffusely decreased bone density. Multilevel mild degenerative changes spine. Multilevel posterior disc osteophyte complex formation most prominent at the C5-C6 level. No severe osseous neural foraminal or central canal stenosis. No acute fracture. No aggressive appearing focal osseous lesion or focal pathologic process. Soft tissues and spinal canal: No prevertebral fluid or swelling. No visible canal hematoma. Upper chest: Emphysematous changes. Other: Atherosclerotic plaque of the aortic arch and its main branches. IMPRESSION: 1. No acute intracranial abnormality in a patient with chronic similar-appearing cerebral infarctions and chronic microvascular ischemic changes. 2. No acute displaced fracture or traumatic listhesis of the cervical spine. 3. Diffusely decreased bone density. 4. Aortic Atherosclerosis (ICD10-I70.0) and Emphysema (ICD10-J43.9). Electronically Signed   By: Tish Frederickson M.D.   On: 05/12/2023 22:01   DG Pelvis Portable  Result Date: 05/12/2023 CLINICAL DATA:  ?post cpr EXAM: PORTABLE PELVIS 1-2 VIEWS COMPARISON:  CT abdomen pelvis 04/30/2023 FINDINGS: Limited evaluation due to overlapping osseous structures and overlying soft tissues. There is no evidence of pelvic fracture or diastasis. No pelvic bone lesions are seen. Degenerative changes lower visualized lumbar spine. Vascular calcifications. IMPRESSION: 1. Negative for acute traumatic injury. Limited evaluation due to overlapping osseous structures and overlying soft tissues. 2.  Aortic Atherosclerosis (ICD10-I70.0). Electronically Signed   By: Tish Frederickson M.D.   On: 05/12/2023 20:29   DG Chest Port 1 View  Result Date: 05/12/2023 CLINICAL DATA:  ?post cpr EXAM: PORTABLE CHEST 1 VIEW.  Patient is rotated. COMPARISON:  CT C-spine 03/22/2022, chest x-ray 12/18/2022 FINDINGS: The heart and mediastinal contours are unchanged. Aortic calcification. Biapical pleural/pulmonary scarring. Biapical  cystic changes and emphysematous changes. No focal consolidation. Chronic coarsened markings with no overt pulmonary edema. No pleural effusion. No pneumothorax. No acute osseous abnormality.  Old healed left clavicular fracture. IMPRESSION: 1. No active disease. 2. Aortic Atherosclerosis (ICD10-I70.0) and Emphysema (ICD10-J43.9). Electronically Signed   By: Tish Frederickson M.D.   On: 05/12/2023 20:28    Pending Labs Unresulted Labs (From admission, onward)    None       Vitals/Pain Today's Vitals   05/13/23 0945 05/13/23 1009 05/13/23 1245 05/13/23 1330  BP: (!) 148/98 (!) 139/97 133/83 (!) 140/95  Pulse: 76 88 93   Resp: 20 17 17  (!) 39  Temp:  98 F (36.7 C)    TempSrc:      SpO2: 94%  94%   Weight:      Height:        Isolation Precautions No active isolations  Medications Medications  acetaminophen (TYLENOL) tablet 650 mg (has no administration in time range)    Or  acetaminophen (TYLENOL) suppository 650 mg (has no administration in time range)  melatonin tablet 3 mg (has no administration in time range)  ondansetron (ZOFRAN) injection 4 mg (has no administration in time range)  apixaban (ELIQUIS) tablet 2.5 mg (2.5 mg Oral Given 05/13/23 0724)  levETIRAcetam (KEPPRA) tablet 500 mg (500 mg Oral Given 05/13/23 0724)  pravastatin (PRAVACHOL) tablet 40 mg (has no administration in time range)  metoprolol tartrate (LOPRESSOR) tablet 50 mg (50 mg Oral Given 05/13/23 0724)  sodium chloride 0.9 % bolus 500 mL (0 mLs Intravenous Stopped 05/12/23 2116)  ziprasidone (GEODON) injection 10 mg (10 mg Intramuscular Given 05/12/23 2114)  sterile water (preservative free) injection (  Given 05/12/23 2116)  iohexol (OMNIPAQUE) 350 MG/ML injection 75 mL (75 mLs Intravenous Contrast Given 05/12/23 2349)  potassium chloride 10 mEq in 100 mL IVPB (0 mEq Intravenous Stopped 05/13/23 0718)    Mobility non-ambulatory       R Recommendations: See Admitting Provider Note  Report given to:    Additional Notes: per staff at SNF, pt was unresponsive and started CPR, CXR shows no fx, pt is altered, baseline she's usually oriented to self and family, per family she's more altered.

## 2023-05-13 NOTE — H&P (Signed)
History and Physical      Kendra Charles:[id number] DOB: 05/10/1941 DOA: 05/12/2023; DOS: 05/13/2023  PCP: Patient, No Pcp Per (will further assess) Patient coming from: SNF  I have personally briefly reviewed patient's old medical records in [facility]  Chief Complaint: Episode of unresponsiveness  HPI: Kendra Charles is a 82 y.o. female with medical history significant for advanced dementia, essential hypertension, hyperlipidemia, paroxysmal atrial fibrillation chronically anticoagulated on Eliquis, seizures, chronic hyponatremia, chronic leukocytosis, who is admitted to [facility] on 05/12/2023 with syncope after presenting from SNF to [facility] ED for evaluation of episode of unresponsiveness.  In the setting of the patient's advanced dementia, the following history is provided by SNF staff, in addition to my discussions with the EDP and via chart review.  The patient was found by SNF staff earlier today unconscious/unresponsive.  Her respiratory status at that time as well as the presence of a pulse is unclear.  CPR was initiated and after approximately 3 minutes of chest compressions, it was noted that the patient was once again conscious and CPR was discontinued.  There was no interval electrical shocks nor any interval medications, including antiarrhythmics.  Upon noting that the patient was conscious, she was felt to be at her baseline mental status and was subsequent brought to [facility] emergency department for further evaluation and management of this episode of unresponsiveness.  No rhythm strips from the above episode are available.  The patient is without acute complaint, aside from some mild abdominal discomfort.  She denies any chest pain, shortness of breath, palpitations, diaphoresis, dizziness, presyncope.  Denies any acute focal weakness, acute febrile numbness or paresthesias.  Her medical history is notable for paroxysmal atrial fibrillation for which she is  chronically anticoagulated on Eliquis.  Most recent echocardiogram occurred in September 2020 and was notable for LVEF 60 to 65%, normal left-ventricular diastolic parameters, normal right ventricular systolic function, moderately dilated left atrium and mild aortic regurgitation.  She also has a history of seizures for which she is on Keppra.  Per chart review, she also has a history of chronic hyponatremia with baseline serum sodium range noted to be 1 30-1 32 since March 2024.  She also has a history of chronic leukocytosis with white blood cell count in the range of 10.6-16.5 since April 2023.       ED Course:  Vital signs in the ED were notable for the following: Afebrile; heart rate in the 70s to 90s; systolic blood pressures in the 1 teens to 130s; respiratory rate 15-22, and oxygen saturation in the mid 90s on room air.  Labs were notable for the following: VBG showed 7.468/31.2.  CBC notable for sodium of 134 compared to most recent prior value of 131 on 04/30/2023, potassium 3.4, bicarbonate 20, anion gap 13, creatinine 0.73 compared to most recent prior serum creatinine data point of 0.60 on 04/30/2023, glucose 124, calcium, adjusted for mild hypoalbuminemia noted to be 9.4, having 3.2, otherwise, liver enzymes within normal limits.  Ammonia 13.  BNP 201 compared to most recent prior value of 286 in November 2020.  High-sensitivity troponin x 2 values were both found to be 8.  CBC notable for white blood cell count 16,400 compared to most recent prior value of 16,500 on 04/30/2023.  Urinalysis showed no white blood cells and was leukocyte esterase/nitrate negative.  Per my interpretation, EKG in ED demonstrated the following: Atrial fibrillation with heart rate 85 prolonged QTc of 519 ms, and no evidence  of T wave or ST changes, including evidence of ST elevation.  Imaging in the ED, per corresponding formal radiology read, was notable for the following: 1 view chest x-ray showed no evidence  of acute cardiopulmonary process, Cleen evidence of infiltrate, edema, effusion, pneumothorax, and no evidence of acute rib fracture.  Noncontrast CT head showed no evidence of acute intracranial process, including evidence of intracranial hemorrhage or any evidence of acute infarct while CT head and neck showed no evidence of acute cervical spine fracture or subluxation injury.  In the context of the patient's report of mild generalized crampy abdominal discomfort, she underwent CT abdomen/pelvis with contrast which showed colonic diverticulosis without evidence of diverticulitis and no evidence of acute intra-abdominal or acute intrapelvic process.  While in the ED, the following were administered: Geodon 10 mg IM x 1, which was administered to facilitate pursuit of CT imaging, as above.  Normal saline x 500 cc bolus.  Subsequently, the patient was admitted for further evaluation and management of syncope in the setting of reported episode of unresponsiveness, with presenting labs notable for hypokalemia with presentation also notable for QTc prolongation.     Review of Systems: As per HPI otherwise 10 point review of systems negative.   Past Medical History:  Diagnosis Date   Hypercholesteremia    Hypertension    PAD (peripheral artery disease) (HCC)    Paroxysmal atrial fibrillation (HCC)    Seizures (HCC)     History reviewed. No pertinent surgical history.  Social History:  reports that she has quit smoking. She has never used smokeless tobacco. She reports that she does not currently use alcohol. She reports that she does not currently use drugs.   No Known Allergies  Family History  Problem Relation Age of Onset   Hypertension Mother    Hypertension Father     Family history reviewed and not pertinent    Prior to Admission medications   Medication Sig Start Date End Date Taking? Authorizing Provider  acetaminophen (TYLENOL) 500 MG tablet Take 500 mg by mouth every 6  (six) hours as needed for mild pain or headache (fever 99.5-101).    [provider]  alum & mag hydroxide-simeth (MAALOX/MYLANTA) 200-200-20 MG/5 SUSP Apply 20 mLs topically 3 (three) times daily as needed (indigestion).    [provider]  Amino Acids-Protein Hydrolys (FEEDING SUPPLEMENT, PRO-STAT 64,) LIQD Take 30 mLs by mouth 2 (two) times daily with a meal.    [provider]  amLODipine (NORVASC) 5 MG tablet Take 5 mg by mouth daily.    [provider]  apixaban (ELIQUIS) 5 MG TABS tablet Take 1 tablet (5 mg total) by mouth 2 (two) times daily. Patient taking differently: Take 2.5 mg by mouth 2 (two) times daily. 06/23/19   Kirt Boys, MD  cephALEXin (KEFLEX) 500 MG capsule Take 1 capsule (500 mg total) by mouth 3 (three) times daily. Patient not taking: Reported on 04/30/2023 12/18/22   Jacalyn Lefevre, MD  diclofenac Sodium (VOLTAREN) 1 % GEL Apply 4 g topically 3 (three) times daily. To right knee    [provider]  donepezil (ARICEPT) 5 MG tablet Take 5 mg by mouth at bedtime.    [provider]  estradiol (ESTRACE) 0.1 MG/GM vaginal cream Place 0.5g in vagina twice a week Patient not taking: Reported on 04/30/2023 12/13/22   Marguerita Beards, MD  levETIRAcetam (KEPPRA) 500 MG tablet Take 1 tablet (500 mg total) by mouth 2 (two) times daily. 06/25/19  Kirt Boys, MD  lisinopril (ZESTRIL) 40 MG tablet Take 40 mg by mouth daily.    [provider]  loratadine (CLARITIN) 10 MG tablet Take 10 mg by mouth daily.    [provider]  LORazepam (ATIVAN) 0.5 MG tablet Take 0.5 mg by mouth 2 (two) times daily.    [provider]  lovastatin (MEVACOR) 40 MG tablet Take 40 mg by mouth at bedtime.  06/17/19   [provider]  magnesium hydroxide (MILK OF MAGNESIA) 400 MG/5ML suspension Take 30 mLs by mouth daily as needed for mild constipation.    [provider]  Melatonin-Theanine 10-5.5 MG  TABS Take 1 tablet by mouth at bedtime.    [provider]  metoprolol tartrate (LOPRESSOR) 50 MG tablet Take 50 mg by mouth 2 (two) times daily.    [provider]  mirabegron ER (MYRBETRIQ) 50 MG TB24 tablet Take 1 tablet (50 mg total) by mouth daily. 12/11/22   Marguerita Beards, MD  Multiple Vitamins-Minerals (MULTIVITAMIN WITH MINERALS) tablet Take 1 tablet by mouth daily.    [provider]  ondansetron (ZOFRAN-ODT) 4 MG disintegrating tablet Take 4 mg by mouth every 6 (six) hours as needed for nausea or vomiting.    [provider]  potassium chloride (KLOR-CON) 10 MEQ tablet Take 1 tablet (10 mEq total) by mouth daily for 20 days. Patient not taking: Reported on 04/30/2023 12/26/22 04/30/23  Terald Sleeper, MD  potassium chloride SA (KLOR-CON M) 20 MEQ tablet Take 20 mEq by mouth daily.    [provider]  sodium chloride 1 g tablet Take 1 g by mouth every other day.    [provider]  traMADol (ULTRAM) 50 MG tablet Take 25 mg by mouth 2 (two) times daily. For knee pain    [provider]  traZODone (DESYREL) 50 MG tablet Take 50 mg by mouth at bedtime.    [provider]     Objective    Physical Exam: Vitals:   05/12/23 2220 05/12/23 2228 05/13/23 0010 05/13/23 0147  BP:  130/80  (!) 131/95  Pulse:  92  (!) 103  Resp: 16 17  16   Temp:   (!) 97.4 F (36.3 C) (!) 97.4 F (36.3 C)  TempSrc:   Oral Oral  SpO2:  94%  92%  Weight:        General: appears to be stated age; alert, confused Skin: warm, dry, no rash Head:  AT/[place] Mouth:  Oral mucosa membranes appear moist, normal dentition Neck: supple; trachea midline Heart:  RRR; did not appreciate any M/R/G Lungs: CTAB, did not appreciate any wheezes, rales, or rhonchi Abdomen: + BS; soft, ND, NT Vascular: 2+ pedal pulses b/l; 2+ radial pulses b/l Extremities: no peripheral edema, no muscle wasting    Labs on Admission: I have personally reviewed  following labs and imaging studies  CBC: Recent Labs  Lab 05/12/23 2026 05/12/23 2032  WBC 16.4*  --   NEUTROABS 13.0*  --   HGB 12.7 13.3  HCT 40.7 39.0  MCV 95.5  --   PLT 343  --    Basic Metabolic Panel: Recent Labs  Lab 05/12/23 2026 05/12/23 2032  NA 134* 135  K 3.4* 3.3*  CL 101  --   CO2 20*  --   GLUCOSE 124*  --   BUN 6*  --   CREATININE 0.73  --   CALCIUM 8.8*  --    GFR: Estimated Creatinine Clearance:  46 mL/min (by C-G formula based on SCr of 0.73 mg/dL). Liver Function Tests: Recent Labs  Lab 05/12/23 2026  AST 20  ALT 13  ALKPHOS 121  BILITOT 0.7  PROT 6.1*  ALBUMIN 3.2*   No results for input(s): "LIPASE", "AMYLASE" in the last 168 hours. Recent Labs  Lab 05/12/23 2026  AMMONIA 13   Coagulation Profile: No results for input(s): "INR", "PROTIME" in the last 168 hours. Cardiac Enzymes: No results for input(s): "CKTOTAL", "CKMB", "CKMBINDEX", "TROPONINI" in the last 168 hours. BNP (last 3 results) No results for input(s): "PROBNP" in the last 8760 hours. HbA1C: No results for input(s): "HGBA1C" in the last 72 hours. CBG: No results for input(s): "GLUCAP" in the last 168 hours. Lipid Profile: No results for input(s): "CHOL", "HDL", "LDLCALC", "TRIG", "CHOLHDL", "LDLDIRECT" in the last 72 hours. Thyroid Function Tests: No results for input(s): "TSH", "T4TOTAL", "FREET4", "T3FREE", "THYROIDAB" in the last 72 hours. Anemia Panel: No results for input(s): "VITAMINB12", "FOLATE", "FERRITIN", "TIBC", "IRON", "RETICCTPCT" in the last 72 hours. Urine analysis:    Component Value Date/Time   COLORURINE STRAW (A) 05/12/2023 2217   APPEARANCEUR CLEAR 05/12/2023 2217   LABSPEC 1.005 05/12/2023 2217   PHURINE 6.0 05/12/2023 2217   GLUCOSEU NEGATIVE 05/12/2023 2217   HGBUR NEGATIVE 05/12/2023 2217   BILIRUBINUR NEGATIVE 05/12/2023 2217   BILIRUBINUR negative 12/11/2022 1537   KETONESUR NEGATIVE 05/12/2023 2217   PROTEINUR NEGATIVE 05/12/2023  2217   UROBILINOGEN 0.2 12/11/2022 1537   NITRITE NEGATIVE 05/12/2023 2217   LEUKOCYTESUR NEGATIVE 05/12/2023 2217    Radiological Exams on Admission: CT ABDOMEN PELVIS W CONTRAST  Result Date: 05/13/2023 CLINICAL DATA:  Abdominal pain, acute, nonlocalized EXAM: CT ABDOMEN AND PELVIS WITH CONTRAST TECHNIQUE: Multidetector CT imaging of the abdomen and pelvis was performed using the standard protocol following bolus administration of intravenous contrast. RADIATION DOSE REDUCTION: This exam was performed according to the departmental dose-optimization program which includes automated exposure control, adjustment of the mA and/or kV according to patient size and/or use of iterative reconstruction technique. CONTRAST:  75mL OMNIPAQUE IOHEXOL 350 MG/ML SOLN COMPARISON:  CT abdomen pelvis 04/30/2023 FINDINGS: Lower chest: Cardiomegaly.  No acute abnormality. Hepatobiliary: No focal liver abnormality. No gallstones, gallbladder wall thickening, or pericholecystic fluid. No biliary dilatation. Pancreas: No focal lesion. Normal pancreatic contour. No surrounding inflammatory changes. No main pancreatic ductal dilatation. Spleen: Normal in size without focal abnormality. Adrenals/Urinary Tract: No adrenal nodule bilaterally. Bilateral kidneys enhance symmetrically. Calcifications associated with the kidneys likely vascular. No hydronephrosis. No hydroureter. The urinary bladder is unremarkable. On delayed imaging, there is no urothelial wall thickening and there are no filling defects in the opacified portions of the bilateral collecting systems or ureters. Stomach/Bowel: Stomach is within normal limits. No evidence of bowel wall thickening or dilatation. Diffuse colonic diverticulosis. Appendix appears normal. Vascular/Lymphatic: No abdominal aorta or iliac aneurysm. Severe atherosclerotic plaque of the aorta and its branches. No abdominal, pelvic, or inguinal lymphadenopathy. Reproductive: Limited evaluation due  to streak artifact. No definite mass. Coarse calcification within the uterus likely a fibroid. Other: No intraperitoneal free fluid. No intraperitoneal free gas. No organized fluid collection. Musculoskeletal: No abdominal wall hernia or abnormality. No suspicious lytic or blastic osseous lesions. No diffusely decreased bone density. Stable chronic incomplete burst fracture of the L1 vertebral body with at least 65% vertebral body height loss and 4 mm retropulsion into the central canal. IMPRESSION: 1. Colonic diverticulosis with no acute diverticulitis. 2. Likely uterine fibroid. 3. Diffusely decreased bone density. Stable  chronic incomplete burst fracture of the L1 vertebral body with at least 65% vertebral body height loss and 4 mm retropulsion into the central canal. 4.  Aortic Atherosclerosis (ICD10-I70.0). Electronically Signed   By: Tish Frederickson M.D.   On: 05/13/2023 00:23   CT Head Wo Contrast  Result Date: 05/12/2023 CLINICAL DATA:  Head trauma, minor (Age >= 65y); Neck trauma (Age >= 65y) EXAM: CT HEAD WITHOUT CONTRAST CT CERVICAL SPINE WITHOUT CONTRAST TECHNIQUE: Multidetector CT imaging of the head and cervical spine was performed following the standard protocol without intravenous contrast. Multiplanar CT image reconstructions of the cervical spine were also generated. RADIATION DOSE REDUCTION: This exam was performed according to the departmental dose-optimization program which includes automated exposure control, adjustment of the mA and/or kV according to patient size and/or use of iterative reconstruction technique. COMPARISON:  CT head 09/15/2020, CT head 12/26/2022, MRI head 08/11/2022 FINDINGS: CT HEAD FINDINGS Brain: Cerebral ventricle sizes are concordant with the degree of cerebral volume loss. Patchy and confluent areas of decreased attenuation are noted throughout the deep and periventricular white matter of the cerebral hemispheres bilaterally, compatible with chronic microvascular  ischemic disease. Similar-appearing right temporoparietal and occipital encephalomalacia. Similar-appearing left basal ganglia/thalamus infarction. No evidence of large-territorial acute infarction. No parenchymal hemorrhage. No mass lesion. No extra-axial collection. No mass effect or midline shift. No hydrocephalus. Basilar cisterns are patent. Vascular: No hyperdense vessel. Skull: No acute fracture or focal lesion. Sinuses/Orbits: Paranasal sinuses and mastoid air cells are clear. The orbits are unremarkable. Other: None. CT CERVICAL SPINE FINDINGS Alignment: Normal. Skull base and vertebrae: Diffusely decreased bone density. Multilevel mild degenerative changes spine. Multilevel posterior disc osteophyte complex formation most prominent at the C5-C6 level. No severe osseous neural foraminal or central canal stenosis. No acute fracture. No aggressive appearing focal osseous lesion or focal pathologic process. Soft tissues and spinal canal: No prevertebral fluid or swelling. No visible canal hematoma. Upper chest: Emphysematous changes. Other: Atherosclerotic plaque of the aortic arch and its main branches. IMPRESSION: 1. No acute intracranial abnormality in a patient with chronic similar-appearing cerebral infarctions and chronic microvascular ischemic changes. 2. No acute displaced fracture or traumatic listhesis of the cervical spine. 3. Diffusely decreased bone density. 4. Aortic Atherosclerosis (ICD10-I70.0) and Emphysema (ICD10-J43.9). Electronically Signed   By: Tish Frederickson M.D.   On: 05/12/2023 22:01   CT Cervical Spine Wo Contrast  Result Date: 05/12/2023 CLINICAL DATA:  Head trauma, minor (Age >= 65y); Neck trauma (Age >= 65y) EXAM: CT HEAD WITHOUT CONTRAST CT CERVICAL SPINE WITHOUT CONTRAST TECHNIQUE: Multidetector CT imaging of the head and cervical spine was performed following the standard protocol without intravenous contrast. Multiplanar CT image reconstructions of the cervical spine were  also generated. RADIATION DOSE REDUCTION: This exam was performed according to the departmental dose-optimization program which includes automated exposure control, adjustment of the mA and/or kV according to patient size and/or use of iterative reconstruction technique. COMPARISON:  CT head 09/15/2020, CT head 12/26/2022, MRI head 08/11/2022 FINDINGS: CT HEAD FINDINGS Brain: Cerebral ventricle sizes are concordant with the degree of cerebral volume loss. Patchy and confluent areas of decreased attenuation are noted throughout the deep and periventricular white matter of the cerebral hemispheres bilaterally, compatible with chronic microvascular ischemic disease. Similar-appearing right temporoparietal and occipital encephalomalacia. Similar-appearing left basal ganglia/thalamus infarction. No evidence of large-territorial acute infarction. No parenchymal hemorrhage. No mass lesion. No extra-axial collection. No mass effect or midline shift. No hydrocephalus. Basilar cisterns are patent. Vascular: No hyperdense vessel. Skull: No  acute fracture or focal lesion. Sinuses/Orbits: Paranasal sinuses and mastoid air cells are clear. The orbits are unremarkable. Other: None. CT CERVICAL SPINE FINDINGS Alignment: Normal. Skull base and vertebrae: Diffusely decreased bone density. Multilevel mild degenerative changes spine. Multilevel posterior disc osteophyte complex formation most prominent at the C5-C6 level. No severe osseous neural foraminal or central canal stenosis. No acute fracture. No aggressive appearing focal osseous lesion or focal pathologic process. Soft tissues and spinal canal: No prevertebral fluid or swelling. No visible canal hematoma. Upper chest: Emphysematous changes. Other: Atherosclerotic plaque of the aortic arch and its main branches. IMPRESSION: 1. No acute intracranial abnormality in a patient with chronic similar-appearing cerebral infarctions and chronic microvascular ischemic changes. 2. No  acute displaced fracture or traumatic listhesis of the cervical spine. 3. Diffusely decreased bone density. 4. Aortic Atherosclerosis (ICD10-I70.0) and Emphysema (ICD10-J43.9). Electronically Signed   By: Tish Frederickson M.D.   On: 05/12/2023 22:01   DG Pelvis Portable  Result Date: 05/12/2023 CLINICAL DATA:  ?post cpr EXAM: PORTABLE PELVIS 1-2 VIEWS COMPARISON:  CT abdomen pelvis 04/30/2023 FINDINGS: Limited evaluation due to overlapping osseous structures and overlying soft tissues. There is no evidence of pelvic fracture or diastasis. No pelvic bone lesions are seen. Degenerative changes lower visualized lumbar spine. Vascular calcifications. IMPRESSION: 1. Negative for acute traumatic injury. Limited evaluation due to overlapping osseous structures and overlying soft tissues. 2.  Aortic Atherosclerosis (ICD10-I70.0). Electronically Signed   By: Tish Frederickson M.D.   On: 05/12/2023 20:29   DG Chest Port 1 View  Result Date: 05/12/2023 CLINICAL DATA:  ?post cpr EXAM: PORTABLE CHEST 1 VIEW.  Patient is rotated. COMPARISON:  CT C-spine 03/22/2022, chest x-ray 12/18/2022 FINDINGS: The heart and mediastinal contours are unchanged. Aortic calcification. Biapical pleural/pulmonary scarring. Biapical cystic changes and emphysematous changes. No focal consolidation. Chronic coarsened markings with no overt pulmonary edema. No pleural effusion. No pneumothorax. No acute osseous abnormality.  Old healed left clavicular fracture. IMPRESSION: 1. No active disease. 2. Aortic Atherosclerosis (ICD10-I70.0) and Emphysema (ICD10-J43.9). Electronically Signed   By: Tish Frederickson M.D.   On: 05/12/2023 20:28      Assessment/Plan   Principal Problem:   Syncope Active Problems:   Chronic hyponatremia   Essential hypertension   Hyperlipidemia   Hypokalemia   Leukocytosis   Paroxysmal atrial fibrillation (HCC)   Prolonged QT interval   History of seizures       #) Syncope: The patient presents with an  episode of unconsciousness/unresponsiveness, which the underlying etiology is not entirely clear, although it is reported that she received 3 minutes of chest compressions for the above.  History limited by the patient's advanced dementia, will noting that she is asymptomatic with the exception of some mild crampy abdominal discomfort, specifically denying any chest pain.  However, the presence or absence of prodrome is unclear.   Differential for this presentation is broad.  Differential includes arrhythmia, including ventricular arrhythmia, will noting QTc prolongation on presenting EKG, increasing risk for torsades de points.  Differential also includes ACS, although at this time, presentation appears less suggestive of such, noting high-sensitivity troponin I x 2 values that were flat nonelevated, while EKG shows no overt evidence of acute ischemic changes, including no evidence of ST elevation.  Will continue to trend troponin and pursue echocardiogram in the morning.  Differential also includes the possibility of seizures, given a documented history of such, although no overt seizure-like activity was reported, and following resolution of her episode of unconsciousness , there  was no report of any confusion relative to her baseline mental status to be consistent with a postictal state.  No acute focal neurologic deficits to suggest acute ischemic stroke, while noting that CT head showed no evidence of acute intracranial process.  Acute pulmonary embolism appears less likely, particular given her chronic anticoagulation on Eliquis.  There is also the possibility of pharmacologic contributions, noting that she is on scheduled tramadol as well as scheduled Ativan as an outpatient.     Plan: Monitor on telemetry.  Trend troponin.  Echocardiogram in the morning.  Further evaluation management of presenting hypokalemia, as further detailed below.  Check serum magnesium level.  CMP, CBC in the morning.  Fall  precautions ordered.  Check urinary drug screen.  Orthostatic vital signs ordered.  Every 4 hours neurochecks x 4 occurrences.  Add on TSH.  Hold home scheduled tramadol and scheduled Ativan for now.  In the setting of her QTc provocation, will also repeat EKG in the morning to further trend degree of QTc prolongation.                 #) Hypokalemia: presenting potassium level noted to be 3.4.  In the context of her presenting episode of unresponsiveness, will provide supplementation of such, as outlined below.  Plan: monitor on tele. KCl 40 meq IV over 4 hours. Add-on serum mag level. CMP, mag level in the AM.  Monitor on telemetry.                     #) QTc prolongation: Presenting EKG demonstrates QTc of 519 ms. outpatient medications that may be contributing to QTc prolongation: Include Aricept.  Her QTc prolongation is notable in the context of presenting episode of unresponsiveness of unclear etiology prompting initiation of CPR at SNF, as above.   Plan: Monitor on telemetry.  Add-on serum magnesium level.  Repeat EKG in the morning to monitor interval degree of QTc prolongation.  Hold outpatient Aricept for now.Marland Kitchen                  #) Essential Hypertension: documented h/o such, with outpatient antihypertensive regimen including metoprolol tartrate, lisinopril, Norvasc.  SBP's in the ED today: 110s to 130s mmHg.   Plan: Close monitoring of subsequent BP via routine VS. will resume home beta-blocker at this time.  Hold home lisinopril and Norvasc this evening.                   #) Hyperlipidemia: documented h/o such. On lovastatin as outpatient.   Plan: continue home statin.                   #) Paroxysmal atrial fibrillation: Documented history of such. In setting of CHA2DS2-VASc score of 5, there is an indication for chronic anticoagulation for thromboembolic prophylaxis. Consistent with this, patient is  chronically anticoagulated on Eliquis. Home AV nodal blocking regimen: Metoprolol tartrate.  Most recent echocardiogram was performed in September 2020, and notable for LVEF 60 to 65%, moderately dilated left atrium and mild aortic regurgitation, with additional results as conveyed above. Presenting EKG suggestive of rate controlled atrial fibrillation without overt evidence of acute ischemic changes..   Plan: monitor strict I's & O's and daily weights. CMP/CBC in AM. Check serum mag level. Continue home AV nodal blocking regimen.  Continue outpatient Eliquis.  Monitor on telemetry.                  #) History of  seizures: Documented history of such, without overt evidence to suggest active seizures at this time.  Outpatient antiepileptic regimen consists of: Keppra.    Plan: Continue outpatient antiepileptic regimen.                    #) Generalized anxiety disorder: documented h/o such. On scheduled Ativan as outpatient.  In the context of her presenting episode of diminished responsiveness, will hold outpatient scheduled Ativan for now.  Plan: Hold scheduled outpatient Ativan for now.                #) Chronic hyponatremia: Patient with baseline serum sodium range of 1 30-1 32 since March 2024.  Her presenting serum sodium level this evening is slightly higher than this baseline range, but noted to be 134 at the time of this evening's presentation.  No overt evidence of acute volume overload, with chest x-ray showing no evidence of acute cardiopulmonary process, and BNP found to be lower than most recent prior value, as further quantified above.  Since this appears chronic for her, with presenting serum sodium level to be slightly higher than baseline, will refrain from aggressive diagnostic inpatient evaluation at this time.  Plan: Monitor strict I's and O's and daily weights.  Repeat CMP in the morning.               #) Chronic  leukocytosis: Documented history of such, with white blood cell count range noted to be 10.6-16.5 dating back to April 2023 her presenting white blood cell count this evening is within this range, and slightly lower than most recent prior value checked on 04/30/2023, as further quantified above.  Unclear etiology for this chronically elevated white blood cell count.  Unclear if she has an underlying diagnosis of CLL.  Will attempt additional chart review to ascertain.  In the absence of any overt evidence of underlying infectious process, criteria for sepsis are not met at this time, will noting chest x-ray that showed no evidence of acute process, urinalysis that was inconsistent with UTI, and CT abdomen/pelvis which showed no evidence of acute intra-abdominal or acute intrapelvic process.  Plan: Monitor strict I's and O's and daily weights.  Repeat CBC with differential in the morning.  Will attempt additional chart review taking additional insight into her chronic leukocytosis.        DVT prophylaxis: SCD's   Code Status: Full code (per previously documented code status discussions during prior hospitalizations ).  Family Communication: none Disposition Plan: Per Rounding Team Consults called: none;  Admission status: obs     I SPENT GREATER THAN 75  MINUTES IN CLINICAL CARE TIME/MEDICAL DECISION-MAKING IN COMPLETING THIS ADMISSION.      Kendra Born Estrellita Lasky DO [facility]  From 7PM - 7AM   05/13/2023, 2:04 AM

## 2023-05-13 NOTE — Progress Notes (Signed)
[facility] PROGRESS NOTE  Orlena Othman ZOX:[id number] DOB: 04/03/1941 DOA: 05/12/2023 PCP: Patient, No Pcp Per  Status is: Observation The patient will require care spanning > 2 midnights and should be moved to inpatient because:work up of syncope    Barriers to discharge: Social:  Clinical:  Level of care: Progressive   Code Status: Full Family Communication:  DVT prophylaxis:  COVID vaccination status:   HPI:  Subjective: Unable to obtain.   Objective: Vitals:   05/13/23 0557 05/13/23 0724  BP:  129/87  Pulse:  (!) 118  Resp:    Temp: 98 F (36.7 C)   SpO2:     No intake or output data in the 24 hours ending 05/13/23 0821 Filed Weights   05/12/23 2026 05/13/23 0801  Weight: 52 kg 52 kg    Exam:  Constitutional: Frail looking not in distress  eyes: PERRL, lids and conjunctivae normal ENMT: Mucous membranes are moist. Posterior pharynx clear of any exudate or lesions.Normal dentition.  Neck: normal, supple, no masses, no thyromegaly Respiratory: clear to auscultation bilaterally, no wheezing, no crackles. Normal respiratory effort. No accessory muscle use.  Cardiovascular: Regular rate and rhythm, no murmurs / rubs / gallops. No extremity edema. 2+ pedal pulses. No carotid bruits.  Abdomen: no tenderness, no masses palpated. No hepatosplenomegaly. Bowel sounds positive.  Musculoskeletal: no clubbing / cyanosis. No joint deformity upper and lower extremities. Good ROM, no contractures. Normal muscle tone.  Skin: no rashes, lesions, ulcers. No induration Neurologic: CN 2-12 grossly intact. Unable to examine motor or sensory psychiatric: Normal judgment and insight. Alert and oriented x 3. Normal mood.    Assessment/Plan:    82 year old female with history of advanced dementia, hypertension, hyperlipidemia, paroxysmal atrial fibrillation on chronic anticoagulation with Eliquis, seizure disorder, chronic hyponatremia, chronic leukocytosis is brought  to the emergency department on 05/12/2023 after having an episode of syncope, unresponsiveness.   Staff at the SNF initiated CPR, patient became  alert, CPR was discontinued .  Workup in the emergency department patient  had normal vital signs.  Patient had WBC count of 16,400.  EKG showed atrial fibrillation with heart rate of 85, QTC 519.  CT head without contrast did not show any acute intracranial abnormality.  CT head and neck is unremarkable.  Complained of mild generalized abdominal discomfort, underwent CT abdomen and pelvis showed colonic diverticulosis without evidence of diverticulitis.  Patient had an episode of agitation, was given Geodon.  Patient is admitted for further workup of syncope.    Syncope -CT head without contrast did not show any acute intracranial abnormality - would not have information regarding loss of consciousness requiring chest compression -continue to monitor on telemetry concerning about patient's prolonged QTC of 519 -continue to cycle cardiac enzymes x3 -echocardiogram -uncertain if patient  lost pulse.   - other possibility is  breakthrough seizures, did not have any seizure-like activity, no focal neuro deficits.  However patient is on tramadol which decreases seizure threshold -avoid any sedating medications - continue with neuro checks  Hypokalemia - replaced by mouth.    Hyponatremia - at baseline  Leukocytosis -most likely reactive.    Prolonged QTC -avoid any QT prolonging agents including trazodone.   - repeat EKG -continue with telemetry -  Seizure disorder -continue with Keppra -discontinue tramadol.    Hypertension -continue with lisinopril, amlodipine.    Paroxysmal atrial fibrillation -currently rate is controlled with Toprol-XL -on chronic anticoagulation with Eliquis.    Advanced dementia -continue with memantine  Data  Reviewed: Basic Metabolic Panel: Recent Labs  Lab 05/12/23 2026 05/12/23 2032 05/12/23 2226  05/13/23 0505  NA 134* 135  --  134*  K 3.4* 3.3*  --  3.7  CL 101  --   --  99  CO2 20*  --   --  25  GLUCOSE 124*  --   --  106*  BUN 6*  --   --  5*  CREATININE 0.73  --   --  0.66  CALCIUM 8.8*  --   --  9.1  MG  --   --  1.7  --    Liver Function Tests: Recent Labs  Lab 05/12/23 2026 05/13/23 0505  AST 20 22  ALT 13 13  ALKPHOS 121 139*  BILITOT 0.7 0.6  PROT 6.1* 6.9  ALBUMIN 3.2* 3.3*   No results for input(s): "LIPASE", "AMYLASE" in the last 168 hours. Recent Labs  Lab 05/12/23 2026  AMMONIA 13   CBC: Recent Labs  Lab 05/12/23 2026 05/12/23 2032 05/13/23 0505  WBC 16.4*  --  16.1*  NEUTROABS 13.0*  --  13.4*  HGB 12.7 13.3 14.3  HCT 40.7 39.0 43.4  MCV 95.5  --  90.4  PLT 343  --  389   Cardiac Enzymes: No results for input(s): "CKTOTAL", "CKMB", "CKMBINDEX", "TROPONINI" in the last 168 hours. BNP (last 3 results) Recent Labs    05/12/23 2026  BNP 201.6*    ProBNP (last 3 results) No results for input(s): "PROBNP" in the last 8760 hours.  CBG: No results for input(s): "GLUCAP" in the last 168 hours.  No results found for this or any previous visit (from the past 240 hour(s)).   Studies: CT ABDOMEN PELVIS W CONTRAST  Result Date: 05/13/2023 CLINICAL DATA:  Abdominal pain, acute, nonlocalized EXAM: CT ABDOMEN AND PELVIS WITH CONTRAST TECHNIQUE: Multidetector CT imaging of the abdomen and pelvis was performed using the standard protocol following bolus administration of intravenous contrast. RADIATION DOSE REDUCTION: This exam was performed according to the departmental dose-optimization program which includes automated exposure control, adjustment of the mA and/or kV according to patient size and/or use of iterative reconstruction technique. CONTRAST:  75mL OMNIPAQUE IOHEXOL 350 MG/ML SOLN COMPARISON:  CT abdomen pelvis 04/30/2023 FINDINGS: Lower chest: Cardiomegaly.  No acute abnormality. Hepatobiliary: No focal liver abnormality. No gallstones,  gallbladder wall thickening, or pericholecystic fluid. No biliary dilatation. Pancreas: No focal lesion. Normal pancreatic contour. No surrounding inflammatory changes. No main pancreatic ductal dilatation. Spleen: Normal in size without focal abnormality. Adrenals/Urinary Tract: No adrenal nodule bilaterally. Bilateral kidneys enhance symmetrically. Calcifications associated with the kidneys likely vascular. No hydronephrosis. No hydroureter. The urinary bladder is unremarkable. On delayed imaging, there is no urothelial wall thickening and there are no filling defects in the opacified portions of the bilateral collecting systems or ureters. Stomach/Bowel: Stomach is within normal limits. No evidence of bowel wall thickening or dilatation. Diffuse colonic diverticulosis. Appendix appears normal. Vascular/Lymphatic: No abdominal aorta or iliac aneurysm. Severe atherosclerotic plaque of the aorta and its branches. No abdominal, pelvic, or inguinal lymphadenopathy. Reproductive: Limited evaluation due to streak artifact. No definite mass. Coarse calcification within the uterus likely a fibroid. Other: No intraperitoneal free fluid. No intraperitoneal free gas. No organized fluid collection. Musculoskeletal: No abdominal wall hernia or abnormality. No suspicious lytic or blastic osseous lesions. No diffusely decreased bone density. Stable chronic incomplete burst fracture of the L1 vertebral body with at least 65% vertebral body height loss and 4 mm retropulsion into  the central canal. IMPRESSION: 1. Colonic diverticulosis with no acute diverticulitis. 2. Likely uterine fibroid. 3. Diffusely decreased bone density. Stable chronic incomplete burst fracture of the L1 vertebral body with at least 65% vertebral body height loss and 4 mm retropulsion into the central canal. 4.  Aortic Atherosclerosis (ICD10-I70.0). Electronically Signed   By: Tish Frederickson M.D.   On: 05/13/2023 00:23   CT Head Wo Contrast  Result  Date: 05/12/2023 CLINICAL DATA:  Head trauma, minor (Age >= 65y); Neck trauma (Age >= 65y) EXAM: CT HEAD WITHOUT CONTRAST CT CERVICAL SPINE WITHOUT CONTRAST TECHNIQUE: Multidetector CT imaging of the head and cervical spine was performed following the standard protocol without intravenous contrast. Multiplanar CT image reconstructions of the cervical spine were also generated. RADIATION DOSE REDUCTION: This exam was performed according to the departmental dose-optimization program which includes automated exposure control, adjustment of the mA and/or kV according to patient size and/or use of iterative reconstruction technique. COMPARISON:  CT head 09/15/2020, CT head 12/26/2022, MRI head 08/11/2022 FINDINGS: CT HEAD FINDINGS Brain: Cerebral ventricle sizes are concordant with the degree of cerebral volume loss. Patchy and confluent areas of decreased attenuation are noted throughout the deep and periventricular white matter of the cerebral hemispheres bilaterally, compatible with chronic microvascular ischemic disease. Similar-appearing right temporoparietal and occipital encephalomalacia. Similar-appearing left basal ganglia/thalamus infarction. No evidence of large-territorial acute infarction. No parenchymal hemorrhage. No mass lesion. No extra-axial collection. No mass effect or midline shift. No hydrocephalus. Basilar cisterns are patent. Vascular: No hyperdense vessel. Skull: No acute fracture or focal lesion. Sinuses/Orbits: Paranasal sinuses and mastoid air cells are clear. The orbits are unremarkable. Other: None. CT CERVICAL SPINE FINDINGS Alignment: Normal. Skull base and vertebrae: Diffusely decreased bone density. Multilevel mild degenerative changes spine. Multilevel posterior disc osteophyte complex formation most prominent at the C5-C6 level. No severe osseous neural foraminal or central canal stenosis. No acute fracture. No aggressive appearing focal osseous lesion or focal pathologic process. Soft  tissues and spinal canal: No prevertebral fluid or swelling. No visible canal hematoma. Upper chest: Emphysematous changes. Other: Atherosclerotic plaque of the aortic arch and its main branches. IMPRESSION: 1. No acute intracranial abnormality in a patient with chronic similar-appearing cerebral infarctions and chronic microvascular ischemic changes. 2. No acute displaced fracture or traumatic listhesis of the cervical spine. 3. Diffusely decreased bone density. 4. Aortic Atherosclerosis (ICD10-I70.0) and Emphysema (ICD10-J43.9). Electronically Signed   By: Tish Frederickson M.D.   On: 05/12/2023 22:01   CT Cervical Spine Wo Contrast  Result Date: 05/12/2023 CLINICAL DATA:  Head trauma, minor (Age >= 65y); Neck trauma (Age >= 65y) EXAM: CT HEAD WITHOUT CONTRAST CT CERVICAL SPINE WITHOUT CONTRAST TECHNIQUE: Multidetector CT imaging of the head and cervical spine was performed following the standard protocol without intravenous contrast. Multiplanar CT image reconstructions of the cervical spine were also generated. RADIATION DOSE REDUCTION: This exam was performed according to the departmental dose-optimization program which includes automated exposure control, adjustment of the mA and/or kV according to patient size and/or use of iterative reconstruction technique. COMPARISON:  CT head 09/15/2020, CT head 12/26/2022, MRI head 08/11/2022 FINDINGS: CT HEAD FINDINGS Brain: Cerebral ventricle sizes are concordant with the degree of cerebral volume loss. Patchy and confluent areas of decreased attenuation are noted throughout the deep and periventricular white matter of the cerebral hemispheres bilaterally, compatible with chronic microvascular ischemic disease. Similar-appearing right temporoparietal and occipital encephalomalacia. Similar-appearing left basal ganglia/thalamus infarction. No evidence of large-territorial acute infarction. No parenchymal hemorrhage. No mass lesion.  No extra-axial collection. No mass  effect or midline shift. No hydrocephalus. Basilar cisterns are patent. Vascular: No hyperdense vessel. Skull: No acute fracture or focal lesion. Sinuses/Orbits: Paranasal sinuses and mastoid air cells are clear. The orbits are unremarkable. Other: None. CT CERVICAL SPINE FINDINGS Alignment: Normal. Skull base and vertebrae: Diffusely decreased bone density. Multilevel mild degenerative changes spine. Multilevel posterior disc osteophyte complex formation most prominent at the C5-C6 level. No severe osseous neural foraminal or central canal stenosis. No acute fracture. No aggressive appearing focal osseous lesion or focal pathologic process. Soft tissues and spinal canal: No prevertebral fluid or swelling. No visible canal hematoma. Upper chest: Emphysematous changes. Other: Atherosclerotic plaque of the aortic arch and its main branches. IMPRESSION: 1. No acute intracranial abnormality in a patient with chronic similar-appearing cerebral infarctions and chronic microvascular ischemic changes. 2. No acute displaced fracture or traumatic listhesis of the cervical spine. 3. Diffusely decreased bone density. 4. Aortic Atherosclerosis (ICD10-I70.0) and Emphysema (ICD10-J43.9). Electronically Signed   By: Tish Frederickson M.D.   On: 05/12/2023 22:01   DG Pelvis Portable  Result Date: 05/12/2023 CLINICAL DATA:  ?post cpr EXAM: PORTABLE PELVIS 1-2 VIEWS COMPARISON:  CT abdomen pelvis 04/30/2023 FINDINGS: Limited evaluation due to overlapping osseous structures and overlying soft tissues. There is no evidence of pelvic fracture or diastasis. No pelvic bone lesions are seen. Degenerative changes lower visualized lumbar spine. Vascular calcifications. IMPRESSION: 1. Negative for acute traumatic injury. Limited evaluation due to overlapping osseous structures and overlying soft tissues. 2.  Aortic Atherosclerosis (ICD10-I70.0). Electronically Signed   By: Tish Frederickson M.D.   On: 05/12/2023 20:29   DG Chest Port 1  View  Result Date: 05/12/2023 CLINICAL DATA:  ?post cpr EXAM: PORTABLE CHEST 1 VIEW.  Patient is rotated. COMPARISON:  CT C-spine 03/22/2022, chest x-ray 12/18/2022 FINDINGS: The heart and mediastinal contours are unchanged. Aortic calcification. Biapical pleural/pulmonary scarring. Biapical cystic changes and emphysematous changes. No focal consolidation. Chronic coarsened markings with no overt pulmonary edema. No pleural effusion. No pneumothorax. No acute osseous abnormality.  Old healed left clavicular fracture. IMPRESSION: 1. No active disease. 2. Aortic Atherosclerosis (ICD10-I70.0) and Emphysema (ICD10-J43.9). Electronically Signed   By: Tish Frederickson M.D.   On: 05/12/2023 20:28    Scheduled Meds:  apixaban  2.5 mg Oral BID   levETIRAcetam  500 mg Oral BID   metoprolol tartrate  50 mg Oral BID   pravastatin  40 mg Oral q1800   Continuous Infusions:  Principal Problem:   Syncope Active Problems:   Chronic hyponatremia   Essential hypertension   Hyperlipidemia   Hypokalemia   Leukocytosis   Paroxysmal atrial fibrillation (HCC)   Prolonged QT interval   History of seizures    Time spent: 45 min    Darnelle Derrick ANP  [facility] 7 am - 330 pm/M-F for direct patient care and secure chat Please refer to Amion for contact info 0  days

## 2023-05-14 DIAGNOSIS — I480 Paroxysmal atrial fibrillation: Secondary | ICD-10-CM | POA: Diagnosis present

## 2023-05-14 DIAGNOSIS — Z681 Body mass index (BMI) 19 or less, adult: Secondary | ICD-10-CM | POA: Diagnosis not present

## 2023-05-14 DIAGNOSIS — R4182 Altered mental status, unspecified: Secondary | ICD-10-CM | POA: Diagnosis present

## 2023-05-14 DIAGNOSIS — Z7901 Long term (current) use of anticoagulants: Secondary | ICD-10-CM | POA: Diagnosis not present

## 2023-05-14 DIAGNOSIS — K5730 Diverticulosis of large intestine without perforation or abscess without bleeding: Secondary | ICD-10-CM | POA: Diagnosis present

## 2023-05-14 DIAGNOSIS — I739 Peripheral vascular disease, unspecified: Secondary | ICD-10-CM | POA: Diagnosis present

## 2023-05-14 DIAGNOSIS — Z8616 Personal history of COVID-19: Secondary | ICD-10-CM | POA: Diagnosis not present

## 2023-05-14 DIAGNOSIS — Z8249 Family history of ischemic heart disease and other diseases of the circulatory system: Secondary | ICD-10-CM | POA: Diagnosis not present

## 2023-05-14 DIAGNOSIS — R55 Syncope and collapse: Secondary | ICD-10-CM | POA: Diagnosis present

## 2023-05-14 DIAGNOSIS — I10 Essential (primary) hypertension: Secondary | ICD-10-CM | POA: Diagnosis present

## 2023-05-14 DIAGNOSIS — Z79899 Other long term (current) drug therapy: Secondary | ICD-10-CM | POA: Diagnosis not present

## 2023-05-14 DIAGNOSIS — E441 Mild protein-calorie malnutrition: Secondary | ICD-10-CM | POA: Diagnosis present

## 2023-05-14 DIAGNOSIS — F0394 Unspecified dementia, unspecified severity, with anxiety: Secondary | ICD-10-CM | POA: Diagnosis present

## 2023-05-14 DIAGNOSIS — F411 Generalized anxiety disorder: Secondary | ICD-10-CM | POA: Diagnosis present

## 2023-05-14 DIAGNOSIS — E871 Hypo-osmolality and hyponatremia: Secondary | ICD-10-CM | POA: Diagnosis present

## 2023-05-14 DIAGNOSIS — Z87891 Personal history of nicotine dependence: Secondary | ICD-10-CM | POA: Diagnosis not present

## 2023-05-14 DIAGNOSIS — D72829 Elevated white blood cell count, unspecified: Secondary | ICD-10-CM | POA: Diagnosis present

## 2023-05-14 DIAGNOSIS — E7800 Pure hypercholesterolemia, unspecified: Secondary | ICD-10-CM | POA: Diagnosis present

## 2023-05-14 DIAGNOSIS — G40909 Epilepsy, unspecified, not intractable, without status epilepticus: Secondary | ICD-10-CM | POA: Diagnosis present

## 2023-05-14 DIAGNOSIS — E876 Hypokalemia: Secondary | ICD-10-CM | POA: Diagnosis present

## 2023-05-14 DIAGNOSIS — G9341 Metabolic encephalopathy: Secondary | ICD-10-CM | POA: Diagnosis present

## 2023-05-14 LAB — CBC
HCT: 41.6 % (ref 36.0–46.0)
Hemoglobin: 14 g/dL (ref 12.0–15.0)
MCH: 30.4 pg (ref 26.0–34.0)
MCHC: 33.7 g/dL (ref 30.0–36.0)
MCV: 90.4 fL (ref 80.0–100.0)
Platelets: 395 10*3/uL (ref 150–400)
RBC: 4.6 MIL/uL (ref 3.87–5.11)
RDW: 12 % (ref 11.5–15.5)
WBC: 14.9 10*3/uL — ABNORMAL HIGH (ref 4.0–10.5)
nRBC: 0 % (ref 0.0–0.2)

## 2023-05-14 LAB — GLUCOSE, CAPILLARY
Glucose-Capillary: 92 mg/dL (ref 70–99)
Glucose-Capillary: 111 mg/dL — ABNORMAL HIGH (ref 70–99)
Glucose-Capillary: 185 mg/dL — ABNORMAL HIGH (ref 70–99)
Glucose-Capillary: 126 mg/dL — ABNORMAL HIGH (ref 70–99)
Glucose-Capillary: 149 mg/dL — ABNORMAL HIGH (ref 70–99)

## 2023-05-14 LAB — BASIC METABOLIC PANEL
Anion gap: 12 (ref 5–15)
BUN: 10 mg/dL (ref 8–23)
CO2: 25 mmol/L (ref 22–32)
Calcium: 9.2 mg/dL (ref 8.9–10.3)
Chloride: 95 mmol/L — ABNORMAL LOW (ref 98–111)
Creatinine, Ser: 0.74 mg/dL (ref 0.44–1.00)
GFR, Estimated: >60 mL/min (ref >60)
Glucose, Bld: 99 mg/dL (ref 70–99)
Potassium: 3.4 mmol/L — ABNORMAL LOW (ref 3.5–5.1)
Sodium: 132 mmol/L — ABNORMAL LOW (ref 135–145)

## 2023-05-14 MED ORDER — DONEPEZIL HCL 10 MG PO TABS
5.0000 mg | ORAL_TABLET | Freq: Every day | ORAL | Status: DC
  Administered 2023-05-14: 5 mg via ORAL
  Filled 2023-05-14: qty 1

## 2023-05-14 MED ORDER — LORAZEPAM 2 MG/ML IJ SOLN
1.0000 mg | Freq: Once | INTRAMUSCULAR | Status: AC
  Administered 2023-05-14: 1 mg via INTRAVENOUS
  Filled 2023-05-14: qty 1

## 2023-05-14 MED ORDER — POTASSIUM CHLORIDE CRYS ER 20 MEQ PO TBCR
40.0000 meq | EXTENDED_RELEASE_TABLET | Freq: Once | ORAL | Status: AC
  Administered 2023-05-14: 40 meq via ORAL
  Filled 2023-05-14: qty 2

## 2023-05-14 MED ORDER — MELATONIN 3 MG PO TABS
3.0000 mg | ORAL_TABLET | Freq: Every day | ORAL | Status: DC
  Administered 2023-05-14: 3 mg via ORAL
  Filled 2023-05-14: qty 1

## 2023-05-14 MED ORDER — LORAZEPAM 0.5 MG PO TABS
0.5000 mg | ORAL_TABLET | Freq: Two times a day (BID) | ORAL | Status: DC
  Administered 2023-05-14 – 2023-05-15 (×2): 0.5 mg via ORAL
  Filled 2023-05-14 (×2): qty 1

## 2023-05-14 MED ORDER — DILTIAZEM HCL-DEXTROSE 125-5 MG/125ML-% IV SOLN (PREMIX)
5.0000 mg/h | INTRAVENOUS | Status: DC
  Administered 2023-05-14: 5 mg/h via INTRAVENOUS
  Filled 2023-05-14: qty 125

## 2023-05-14 NOTE — Evaluation (Signed)
Physical Therapy Evaluation Patient Details Name: Kendra Charles MRN: [id number] DOB: [birth year] Today's Date: 05/14/2023  History of Present Illness  Patient is a 82 year old female who was found unresponsive at SNF where CPR was started after having an episode of syncope.  History of advanced dementia  hypertension, hyperlipidemia, paroxysmal atrial fibrillation on chronic anticoagulation with Eliquis, seizure disorder, chronic hyponatremia, chronic leukocytosis.   Clinical Impression  Patient is agreeable to PT. She is confused but pleasant and can follow most single step commands with multimodal cues. Chart indicates patient is from SNF.  The patient reports no dizziness with mobility today. She requires assistance for bed mobility, transfers. Limited standing tolerance for progression of ambulation. Orthostatic vitals taken during session. Recommend to continue PT to maximize independence and decrease caregiver burden.        Orthostatic VS for the past 24 hrs:  BP- Lying Pulse- Lying BP- Sitting Pulse- Sitting BP- Standing at 0 minutes* Pulse- Standing at 0 minutes  05/14/23 1203 146/80 88 144/89 78 142/71 103                                                                                                                                  *blood pressure taken immediately after standing                                                            (unable to stand long enough for standing BP reading)    If plan is discharge home, recommend the following: A lot of help with walking and/or transfers;A lot of help with bathing/dressing/bathroom;Assist for transportation;Help with stairs or ramp for entrance;Assistance with cooking/housework   Can travel by private vehicle   No    Equipment Recommendations  (to be determined at next level of care)  Recommendations for Other Services       Functional Status Assessment Patient has had a recent decline in their functional status and  demonstrates the ability to make significant improvements in function in a reasonable and predictable amount of time.     Precautions / Restrictions Precautions Precautions: Fall Restrictions Weight Bearing Restrictions: No      Mobility  Bed Mobility Overal bed mobility: Needs Assistance Bed Mobility: Supine to Sit, Sit to Supine     Supine to sit: Mod assist Sit to supine: Mod assist   General bed mobility comments: bed mobility performed x 2. assistance for LE and trunk support    Transfers Overall transfer level: Needs assistance Equipment used: 1 person hand held assist Transfers: Sit to/from Stand Sit to Stand: Max assist           General transfer comment: faciliation for anterior weight shifting as patient with posterior bias  Ambulation/Gait                  Stairs            Wheelchair Mobility     Tilt Bed    Modified Rankin (Stroke Patients Only)       Balance Overall balance assessment: Needs assistance Sitting-balance support: No upper extremity supported, Feet supported Sitting balance-Leahy Scale: Poor Sitting balance - Comments: poor initially with right side lean progressing to fair with increased sitting time   Standing balance support: No upper extremity supported Standing balance-Leahy Scale: Zero Standing balance comment: maximal assistance required to maintain standing balance with standing tolerance of less than 30 seconds.                             Pertinent Vitals/Pain Pain Assessment Pain Assessment: Faces Pain Score: 2  Pain Location: sternal area with laying flat Pain Descriptors / Indicators: Sore Pain Intervention(s): Limited activity within patient's tolerance, Monitored during session, Repositioned    Home Living Family/patient expects to be discharged to:: Skilled nursing facility                        Prior Function Prior Level of Function : Needs assist              Mobility Comments: patient reports she required assistance for mobility. she is a poor historian and unable to state if she is ambulatory or used assistive device       Hand Dominance        Extremity/Trunk Assessment   Upper Extremity Assessment Upper Extremity Assessment: Generalized weakness    Lower Extremity Assessment Lower Extremity Assessment: Generalized weakness       Communication   Communication: No difficulties  Cognition Arousal/Alertness: Awake/alert Behavior During Therapy: WFL for tasks assessed/performed Overall Cognitive Status: History of cognitive impairments - at baseline                                 General Comments: Patient is oriented to self. She is cooperative during session and able to follow single step commands with multimodal cues        General Comments General comments (skin integrity, edema, etc.): no dizziness reported with mobility. heart rate in the 80-90's. orthostatic vitals taken (see flowsheet)    Exercises     Assessment/Plan    PT Assessment Patient needs continued PT services  PT Problem List Decreased strength;Decreased range of motion;Decreased activity tolerance;Decreased balance;Decreased mobility;Decreased cognition;Decreased safety awareness       PT Treatment Interventions DME instruction;Gait training;Stair training;Functional mobility training;Therapeutic activities;Therapeutic exercise;Balance training;Neuromuscular re-education;Cognitive remediation;Patient/family education    PT Goals (Current goals can be found in the Care Plan section)  Acute Rehab PT Goals Patient Stated Goal: none stated PT Goal Formulation: Patient unable to participate in goal setting Time For Goal Achievement: 05/21/23 Potential to Achieve Goals: Fair    Frequency Min 1X/week     Co-evaluation               AM-PAC PT "6 Clicks" Mobility  Outcome Measure Help needed turning from your back to your side  while in a flat bed without using bedrails?: A Lot Help needed moving from lying on your back to sitting on the side of a flat bed without using bedrails?: A Lot Help needed moving to  and from a bed to a chair (including a wheelchair)?: A Lot Help needed standing up from a chair using your arms (e.g., wheelchair or bedside chair)?: A Lot Help needed to walk in hospital room?: Total Help needed climbing 3-5 steps with a railing? : Total 6 Click Score: 10    End of Session   Activity Tolerance: Patient limited by fatigue Patient left: in bed;with call bell/phone within reach;with bed alarm set   PT Visit Diagnosis: Muscle weakness (generalized) (M62.81);Unsteadiness on feet (R26.81)    Time: 6295-2841 PT Time Calculation (min) (ACUTE ONLY): 34 min   Charges:   PT Evaluation $PT Eval Low Complexity: 1 Low PT Treatments $Therapeutic Activity: 8-22 mins PT General Charges $$ ACUTE PT VISIT: 1 Visit        Donna Bernard, PT, MPT   Ina [facility] 05/14/2023, 12:34 PM

## 2023-05-14 NOTE — TOC Initial Note (Signed)
Transition of Care [facility]) - Initial/Assessment Note    Patient Details  Name: Kendra Charles MRN: [id number] Date of Birth: 05-19-1941  [facility] CM/SW Contact:    Baldemar Lenis, LCSW Phone Number: 05/14/2023, 1:18 PM  Clinical Narrative:      CSW attempted to contact [facility] to discuss patient's living situation and baseline, left a voicemail, awaiting call back. CSW spoke with son, Cleone Slim, who indicated that patient is in ALF at [facility]. Patient is wheelchair bound, has historically been able to get into the wheelchair herself but would occasionally fall; recently has needed help getting into the wheelchair. Patient has been receiving PT at [facility] but often declines to participate. CSW discussed SNF with Rob, and he would prefer to avoid it as he does not believe the patient would benefit from rehab at this time. CSW to follow.             Expected Discharge Plan: Assisted Living Barriers to Discharge: Continued Medical Work up   Patient Goals and CMS Choice Patient states their goals for this hospitalization and ongoing recovery are:: patient unable to participate in goal setting, not oriented CMS Medicare.gov Compare Post Acute Care list provided to:: Patient Represenative (must comment) Choice offered to / list presented to : Adult Children [place] ownership interest in [facility].provided to:: Adult Children    Expected Discharge Plan and Services     Post Acute Care Choice: Nursing Home Living arrangements for the past 2 months: Assisted Living Facility                                      Prior Living Arrangements/Services Living arrangements for the past 2 months: Assisted Living Facility Lives with:: Facility Resident Patient language and need for interpreter reviewed:: No Do you feel safe going back to the place where you live?: Yes      Need for Family Participation in Patient Care: Yes (Comment) Care giver  support system in place?: Yes (comment) Current home services: DME, Home PT Criminal Activity/Legal Involvement Pertinent to Current Situation/Hospitalization: No - Comment as needed  Activities of Daily Living      Permission Sought/Granted Permission sought to share information with : Facility Medical sales representative, Family Supports Permission granted to share information with : Yes, Verbal Permission Granted  Share Information with NAME: Marylou Flesher  Permission granted to share info w AGENCY: [employer]  Permission granted to share info w Relationship: Children     Emotional Assessment   Attitude/Demeanor/Rapport: Unable to Assess Affect (typically observed): Unable to Assess Orientation: : Oriented to Self Alcohol / Substance Use: Not Applicable Psych Involvement: No (comment)  Admission diagnosis:  Hypokalemia [E87.6] Hyponatremia [E87.1] Unresponsive [R41.89] Elevated brain natriuretic peptide (BNP) level [R79.89] Acute encephalopathy [G93.40] Patient Active Problem List   Diagnosis Date Noted   Acute encephalopathy 05/13/2023   Syncope 05/13/2023   Prolonged QT interval 05/13/2023   History of seizures 05/13/2023   Paroxysmal atrial fibrillation (HCC) 03/19/2021   Hypokalemia 03/18/2021   Leukocytosis 03/18/2021   Acute metabolic encephalopathy 03/18/2021   Acute hypoxemic respiratory failure due to COVID-19 [facility]) 09/04/2019   Seizure (HCC) 06/19/2019   Chronic hyponatremia 06/18/2019   AKI (acute kidney injury) (HCC) 06/18/2019   Dementia (HCC) 06/18/2019   Essential hypertension 06/18/2019   Hyperlipidemia 06/18/2019   PCP:  Patient, No Pcp Per Pharmacy:  No Pharmacies  Listed    Social Determinants of Health (SDOH) Social History: SDOH Screenings   Depression (PHQ2-9): Medium Risk (02/14/2022)  Tobacco Use: Medium Risk (05/13/2023)   SDOH Interventions:     Readmission Risk Interventions     No data to display

## 2023-05-14 NOTE — Progress Notes (Signed)
1620 This RN received call from tele. Pt running afib with rvr with HR in 160's. MD made aware. See order for Cardizem. See MAR for administration.

## 2023-05-14 NOTE — Plan of Care (Signed)

## 2023-05-14 NOTE — Progress Notes (Addendum)
PROGRESS NOTE  Kendra Charles  NWG:[id number] DOB: 1940/12/30 DOA: 05/12/2023 PCP: Patient, No Pcp Per   Brief Narrative: Patient is 82 year old female with history of advanced dementia, hypertension, hyperlipidemia, paroxysmal A-fib on Eliquis, seizure disorder, chronic hyponatremia, chronic leukocytosis who presented with unresponsiveness from SNF.  Patient has history of advanced dementia.  CPR was also given in the SNF for 3 minutes, then it was noted as patient was conscious and CPR was discontinued.  On presentation, she was hemodynamically stable, saturating fine on room air.  Lab work showed potassium 3.4, WC count of 16.4.  UA was not suspicious of UTI.  EKG showed QTc of 519.  Chest x-ray did not show any acute process.  CT of the head did not show any acute findings.  Patient was admitted for the workup of  syncope.  PT/OT consulted. She remains alert and awake and at baseline.  Assessment & Plan:  Principal Problem:   Syncope Active Problems:   Chronic hyponatremia   Essential hypertension   Hyperlipidemia   Hypokalemia   Leukocytosis   Paroxysmal atrial fibrillation (HCC)   Prolonged QT interval   History of seizures   Syncopal episode:Period of  brief unconsciousness, unresponsiveness.  No clear etiology.  Given CPR as well.  Troponin negative.  EKG did not show any acute ischemic signs.  Echocardiogram showed EF of 55 to 60%, indeterminate diastolic parameters, no wall motion abnormality, no aortic stenosis.  PT/OT consulted.  CT head did not show any acute findings.  Will check orthostatic vitals.  Prolonged QT: Last QTc has improved to 454  Hypokalemia: Currently being supplemented and monitored  History of hypertension: Currently normotensive.  Home beta-blocker resumed.  Home lisinopril, Norvasc on hold  Hyperlipidemia: On Lovastatin  Paroxysmal A-fib: On Eliquis for anticoagulation.  On beta-blocker for rate control.  She remains in A-fib this morning with heart  rate in the range of 100-1 20.  Will continue to titrate the dose  of metoprolol as needed  History of seizures: On Keppra  GAD: Outpatient Ativan  Chronic hyponatremia: Baseline sodium level in the range of 130-132.  Continue to monitor,stable  Hypokalemia: Supplement with potassium  Dementia: Alert and awake but confused.  Continue delirium precautions, supportive care, frequent reorientation  Leukocytosis: This is chronic problem.  WBC count of 16.5 on admission.  Unclear etiology.  No signs of infectious process.  Chest x-ray did not show any pneumonia.  UA was suspicious for UTI.  CT abdomen/pelvis did not show any acute intra-abdominal or intrapelvic process.Improved   Addendum: I was reported that patient went into A-fib with RVR this afternoon.  Patient's telemetry reviewed, found to be A-fib with rate in the range of 160s.  Blood pressure stable.  Will start on Cardizem drip.  I again called her son Molly Maduro to give update, call not received and goes to voicemail       DVT prophylaxis:apixaban (ELIQUIS) tablet 2.5 mg Start: 05/13/23 0800 SCDs Start: 05/13/23 0120 apixaban (ELIQUIS) tablet 2.5 mg     Code Status: Full Code  Family Communication: called both son  Molly Maduro and daughter Marcelino Duster on phone today, calls not received  Patient status:Obs  Patient is from :SNF  Anticipated discharge to:SNF  Estimated DC date:tomorrow   Consultants: None  Procedures:None  Antimicrobials:  Anti-infectives (From admission, onward)    None       Subjective: Patient seen and examined at bedside today.  She appears very comfortable.  Lying in bed.  Alert and awake,  follows commands but totally confused.  Not agitated  Objective: Vitals:   05/13/23 1752 05/13/23 1942 05/13/23 2316 05/14/23 0307  BP: (!) 143/92 125/87 126/79 126/79  Pulse:  92 93 (!) 104  Resp:  18 18 18   Temp:  98.3 F (36.8 C) 98.3 F (36.8 C) 99.3 F (37.4 C)  TempSrc:  Oral Oral Oral  SpO2:  (!)  82% 92% 92%  Weight:      Height:       No intake or output data in the 24 hours ending 05/14/23 0814 Filed Weights   05/12/23 2026 05/13/23 0801  Weight: 52 kg 52 kg    Examination:  General exam: Overall comfortable, not in distress, thin built, pleasantly confused elderly female HEENT: PERRL Respiratory system:  no wheezes or crackles  Cardiovascular system: S1 & S2 heard, RRR.  Gastrointestinal system: Abdomen is nondistended, soft and nontender. Central nervous system: Alert and awake but not oriented Extremities: No edema, no clubbing ,no cyanosis Skin: No rashes, no ulcers,no icterus     Data Reviewed: I have personally reviewed following labs and imaging studies  CBC: Recent Labs  Lab 05/12/23 2026 05/12/23 2032 05/13/23 0505  WBC 16.4*  --  16.1*  NEUTROABS 13.0*  --  13.4*  HGB 12.7 13.3 14.3  HCT 40.7 39.0 43.4  MCV 95.5  --  90.4  PLT 343  --  389   Basic Metabolic Panel: Recent Labs  Lab 05/12/23 2026 05/12/23 2032 05/12/23 2226 05/13/23 0505  NA 134* 135  --  134*  K 3.4* 3.3*  --  3.7  CL 101  --   --  99  CO2 20*  --   --  25  GLUCOSE 124*  --   --  106*  BUN 6*  --   --  5*  CREATININE 0.73  --   --  0.66  CALCIUM 8.8*  --   --  9.1  MG  --   --  1.7  --      No results found for this or any previous visit (from the past 240 hour(s)).   Radiology Studies: ECHOCARDIOGRAM COMPLETE  Result Date: 05/13/2023    ECHOCARDIOGRAM REPORT   Patient Name:   Kendra Charles Date of Exam: 05/13/2023 Medical Rec #:  [id number]      Height:       64.0 in Accession #:    [id number]     Weight:       114.6 lb Date of Birth:  January 27, 1941      BSA:          1.544 m Patient Age:    81 years       BP:           140/95 mmHg Patient Gender: F              HR:           . bpm. Exam Location:  Inpatient Procedure: 2D Echo, Cardiac Doppler and Color Doppler Indications:    Syncope R55  History:        Patient has prior history of Echocardiogram examinations, most                  recent 06/21/2019. Risk Factors:Hypertension, Dyslipidemia and                 Former Smoker.  Sonographer:    Dondra Prader RVT RCS Referring Phys: Angie Fava  Sonographer Comments: Unable  to complete ECHO due to patients lack of cooperation due to dementia; pulling at EEG leads and refusal to reposition to get subcostals. IMPRESSIONS  1. Left ventricular ejection fraction, by estimation, is 55 to 60%. The left ventricle has normal function. The left ventricle has no regional wall motion abnormalities. There is moderate concentric left ventricular hypertrophy. Left ventricular diastolic parameters are indeterminate.  2. Right ventricular systolic function is normal. The right ventricular size is normal. Tricuspid regurgitation signal is inadequate for assessing PA pressure.  3. Left atrial size was mildly dilated.  4. The mitral valve is normal in structure. No evidence of mitral valve regurgitation. No evidence of mitral stenosis.  5. The aortic valve is tricuspid. There is mild calcification of the aortic valve. Aortic valve regurgitation is mild. No aortic stenosis is present.  6. Aortic dilatation noted. There is moderate dilatation of the ascending aorta, measuring 48 mm.  7. Limited echo due to poor patient cooperation. FINDINGS  Left Ventricle: Left ventricular ejection fraction, by estimation, is 55 to 60%. The left ventricle has normal function. The left ventricle has no regional wall motion abnormalities. The left ventricular internal cavity size was normal in size. There is  moderate concentric left ventricular hypertrophy. Left ventricular diastolic parameters are indeterminate. Right Ventricle: The right ventricular size is normal. No increase in right ventricular wall thickness. Right ventricular systolic function is normal. Tricuspid regurgitation signal is inadequate for assessing PA pressure. Left Atrium: Left atrial size was mildly dilated. Right Atrium: Right atrial size was normal in  size. Pericardium: There is no evidence of pericardial effusion. Mitral Valve: The mitral valve is normal in structure. There is mild calcification of the mitral valve leaflet(s). Mild mitral annular calcification. No evidence of mitral valve regurgitation. No evidence of mitral valve stenosis. Tricuspid Valve: The tricuspid valve is normal in structure. Tricuspid valve regurgitation is trivial. Aortic Valve: The aortic valve is tricuspid. There is mild calcification of the aortic valve. Aortic valve regurgitation is mild. No aortic stenosis is present. Aortic valve mean gradient measures 1.0 mmHg. Aortic valve peak gradient measures 2.8 mmHg. Aortic valve area, by VTI measures 2.61 cm. Pulmonic Valve: The pulmonic valve was normal in structure. Pulmonic valve regurgitation is not visualized. Aorta: The aortic root is normal in size and structure and aortic dilatation noted. There is moderate dilatation of the ascending aorta, measuring 48 mm. Venous: The inferior vena cava was not well visualized. IAS/Shunts: No atrial level shunt detected by color flow Doppler.  LEFT VENTRICLE PLAX 2D LVIDd:         3.20 cm LVIDs:         2.00 cm LV PW:         1.30 cm LV IVS:        1.10 cm LVOT diam:     1.80 cm LV SV:         30 LV SV Index:   19 LVOT Area:     2.54 cm  LEFT ATRIUM             Index        RIGHT ATRIUM          Index LA diam:        3.90 cm 2.53 cm/m   RA Area:     9.64 cm LA Vol (A2C):   51.0 ml 33.02 ml/m  RA Volume:   23.50 ml 15.22 ml/m LA Vol (A4C):   37.6 ml 24.35 ml/m LA Biplane Vol: 46.9  ml 30.37 ml/m  AORTIC VALVE                    PULMONIC VALVE AV Area (Vmax):    2.08 cm     PV Vmax:       0.81 m/s AV Area (Vmean):   2.07 cm     PV Peak grad:  2.6 mmHg AV Area (VTI):     2.61 cm AV Vmax:           83.00 cm/s AV Vmean:          54.700 cm/s AV VTI:            0.113 m AV Peak Grad:      2.8 mmHg AV Mean Grad:      1.0 mmHg LVOT Vmax:         67.90 cm/s LVOT Vmean:        44.500 cm/s LVOT  VTI:          0.116 m LVOT/AV VTI ratio: 1.03 AR Vena Contracta: 0.40 cm  AORTA Ao Root diam: 3.30 cm Ao Asc diam:  4.80 cm MITRAL VALVE MV Area (PHT): 8.16 cm    SHUNTS MV Decel Time: 93 msec     Systemic VTI:  0.12 m MV E velocity: 76.50 cm/s  Systemic Diam: 1.80 cm Dalton McleanMD Electronically signed by Wilfred Lacy Signature Date/Time: 05/13/2023/4:55:53 PM    Final    CT ABDOMEN PELVIS W CONTRAST  Result Date: 05/13/2023 CLINICAL DATA:  Abdominal pain, acute, nonlocalized EXAM: CT ABDOMEN AND PELVIS WITH CONTRAST TECHNIQUE: Multidetector CT imaging of the abdomen and pelvis was performed using the standard protocol following bolus administration of intravenous contrast. RADIATION DOSE REDUCTION: This exam was performed according to the departmental dose-optimization program which includes automated exposure control, adjustment of the mA and/or kV according to patient size and/or use of iterative reconstruction technique. CONTRAST:  75mL OMNIPAQUE IOHEXOL 350 MG/ML SOLN COMPARISON:  CT abdomen pelvis 04/30/2023 FINDINGS: Lower chest: Cardiomegaly.  No acute abnormality. Hepatobiliary: No focal liver abnormality. No gallstones, gallbladder wall thickening, or pericholecystic fluid. No biliary dilatation. Pancreas: No focal lesion. Normal pancreatic contour. No surrounding inflammatory changes. No main pancreatic ductal dilatation. Spleen: Normal in size without focal abnormality. Adrenals/Urinary Tract: No adrenal nodule bilaterally. Bilateral kidneys enhance symmetrically. Calcifications associated with the kidneys likely vascular. No hydronephrosis. No hydroureter. The urinary bladder is unremarkable. On delayed imaging, there is no urothelial wall thickening and there are no filling defects in the opacified portions of the bilateral collecting systems or ureters. Stomach/Bowel: Stomach is within normal limits. No evidence of bowel wall thickening or dilatation. Diffuse colonic diverticulosis. Appendix  appears normal. Vascular/Lymphatic: No abdominal aorta or iliac aneurysm. Severe atherosclerotic plaque of the aorta and its branches. No abdominal, pelvic, or inguinal lymphadenopathy. Reproductive: Limited evaluation due to streak artifact. No definite mass. Coarse calcification within the uterus likely a fibroid. Other: No intraperitoneal free fluid. No intraperitoneal free gas. No organized fluid collection. Musculoskeletal: No abdominal wall hernia or abnormality. No suspicious lytic or blastic osseous lesions. No diffusely decreased bone density. Stable chronic incomplete burst fracture of the L1 vertebral body with at least 65% vertebral body height loss and 4 mm retropulsion into the central canal. IMPRESSION: 1. Colonic diverticulosis with no acute diverticulitis. 2. Likely uterine fibroid. 3. Diffusely decreased bone density. Stable chronic incomplete burst fracture of the L1 vertebral body with at least 65% vertebral body height loss and 4 mm retropulsion into the  central canal. 4.  Aortic Atherosclerosis (ICD10-I70.0). Electronically Signed   By: Tish Frederickson M.D.   On: 05/13/2023 00:23   CT Head Wo Contrast  Result Date: 05/12/2023 CLINICAL DATA:  Head trauma, minor (Age >= 65y); Neck trauma (Age >= 65y) EXAM: CT HEAD WITHOUT CONTRAST CT CERVICAL SPINE WITHOUT CONTRAST TECHNIQUE: Multidetector CT imaging of the head and cervical spine was performed following the standard protocol without intravenous contrast. Multiplanar CT image reconstructions of the cervical spine were also generated. RADIATION DOSE REDUCTION: This exam was performed according to the departmental dose-optimization program which includes automated exposure control, adjustment of the mA and/or kV according to patient size and/or use of iterative reconstruction technique. COMPARISON:  CT head 09/15/2020, CT head 12/26/2022, MRI head 08/11/2022 FINDINGS: CT HEAD FINDINGS Brain: Cerebral ventricle sizes are concordant with the  degree of cerebral volume loss. Patchy and confluent areas of decreased attenuation are noted throughout the deep and periventricular white matter of the cerebral hemispheres bilaterally, compatible with chronic microvascular ischemic disease. Similar-appearing right temporoparietal and occipital encephalomalacia. Similar-appearing left basal ganglia/thalamus infarction. No evidence of large-territorial acute infarction. No parenchymal hemorrhage. No mass lesion. No extra-axial collection. No mass effect or midline shift. No hydrocephalus. Basilar cisterns are patent. Vascular: No hyperdense vessel. Skull: No acute fracture or focal lesion. Sinuses/Orbits: Paranasal sinuses and mastoid air cells are clear. The orbits are unremarkable. Other: None. CT CERVICAL SPINE FINDINGS Alignment: Normal. Skull base and vertebrae: Diffusely decreased bone density. Multilevel mild degenerative changes spine. Multilevel posterior disc osteophyte complex formation most prominent at the C5-C6 level. No severe osseous neural foraminal or central canal stenosis. No acute fracture. No aggressive appearing focal osseous lesion or focal pathologic process. Soft tissues and spinal canal: No prevertebral fluid or swelling. No visible canal hematoma. Upper chest: Emphysematous changes. Other: Atherosclerotic plaque of the aortic arch and its main branches. IMPRESSION: 1. No acute intracranial abnormality in a patient with chronic similar-appearing cerebral infarctions and chronic microvascular ischemic changes. 2. No acute displaced fracture or traumatic listhesis of the cervical spine. 3. Diffusely decreased bone density. 4. Aortic Atherosclerosis (ICD10-I70.0) and Emphysema (ICD10-J43.9). Electronically Signed   By: Tish Frederickson M.D.   On: 05/12/2023 22:01   CT Cervical Spine Wo Contrast  Result Date: 05/12/2023 CLINICAL DATA:  Head trauma, minor (Age >= 65y); Neck trauma (Age >= 65y) EXAM: CT HEAD WITHOUT CONTRAST CT CERVICAL  SPINE WITHOUT CONTRAST TECHNIQUE: Multidetector CT imaging of the head and cervical spine was performed following the standard protocol without intravenous contrast. Multiplanar CT image reconstructions of the cervical spine were also generated. RADIATION DOSE REDUCTION: This exam was performed according to the departmental dose-optimization program which includes automated exposure control, adjustment of the mA and/or kV according to patient size and/or use of iterative reconstruction technique. COMPARISON:  CT head 09/15/2020, CT head 12/26/2022, MRI head 08/11/2022 FINDINGS: CT HEAD FINDINGS Brain: Cerebral ventricle sizes are concordant with the degree of cerebral volume loss. Patchy and confluent areas of decreased attenuation are noted throughout the deep and periventricular white matter of the cerebral hemispheres bilaterally, compatible with chronic microvascular ischemic disease. Similar-appearing right temporoparietal and occipital encephalomalacia. Similar-appearing left basal ganglia/thalamus infarction. No evidence of large-territorial acute infarction. No parenchymal hemorrhage. No mass lesion. No extra-axial collection. No mass effect or midline shift. No hydrocephalus. Basilar cisterns are patent. Vascular: No hyperdense vessel. Skull: No acute fracture or focal lesion. Sinuses/Orbits: Paranasal sinuses and mastoid air cells are clear. The orbits are unremarkable. Other: None. CT CERVICAL SPINE  FINDINGS Alignment: Normal. Skull base and vertebrae: Diffusely decreased bone density. Multilevel mild degenerative changes spine. Multilevel posterior disc osteophyte complex formation most prominent at the C5-C6 level. No severe osseous neural foraminal or central canal stenosis. No acute fracture. No aggressive appearing focal osseous lesion or focal pathologic process. Soft tissues and spinal canal: No prevertebral fluid or swelling. No visible canal hematoma. Upper chest: Emphysematous changes. Other:  Atherosclerotic plaque of the aortic arch and its main branches. IMPRESSION: 1. No acute intracranial abnormality in a patient with chronic similar-appearing cerebral infarctions and chronic microvascular ischemic changes. 2. No acute displaced fracture or traumatic listhesis of the cervical spine. 3. Diffusely decreased bone density. 4. Aortic Atherosclerosis (ICD10-I70.0) and Emphysema (ICD10-J43.9). Electronically Signed   By: Tish Frederickson M.D.   On: 05/12/2023 22:01   DG Pelvis Portable  Result Date: 05/12/2023 CLINICAL DATA:  ?post cpr EXAM: PORTABLE PELVIS 1-2 VIEWS COMPARISON:  CT abdomen pelvis 04/30/2023 FINDINGS: Limited evaluation due to overlapping osseous structures and overlying soft tissues. There is no evidence of pelvic fracture or diastasis. No pelvic bone lesions are seen. Degenerative changes lower visualized lumbar spine. Vascular calcifications. IMPRESSION: 1. Negative for acute traumatic injury. Limited evaluation due to overlapping osseous structures and overlying soft tissues. 2.  Aortic Atherosclerosis (ICD10-I70.0). Electronically Signed   By: Tish Frederickson M.D.   On: 05/12/2023 20:29   DG Chest Port 1 View  Result Date: 05/12/2023 CLINICAL DATA:  ?post cpr EXAM: PORTABLE CHEST 1 VIEW.  Patient is rotated. COMPARISON:  CT C-spine 03/22/2022, chest x-ray 12/18/2022 FINDINGS: The heart and mediastinal contours are unchanged. Aortic calcification. Biapical pleural/pulmonary scarring. Biapical cystic changes and emphysematous changes. No focal consolidation. Chronic coarsened markings with no overt pulmonary edema. No pleural effusion. No pneumothorax. No acute osseous abnormality.  Old healed left clavicular fracture. IMPRESSION: 1. No active disease. 2. Aortic Atherosclerosis (ICD10-I70.0) and Emphysema (ICD10-J43.9). Electronically Signed   By: Tish Frederickson M.D.   On: 05/12/2023 20:28    Scheduled Meds:  apixaban  2.5 mg Oral BID   levETIRAcetam  500 mg Oral BID    metoprolol tartrate  50 mg Oral BID   pravastatin  40 mg Oral q1800   Continuous Infusions:   LOS: 0 days   Burnadette Pop, MD [facility] P7/30/2024, 8:14 AM

## 2023-05-15 DIAGNOSIS — R55 Syncope and collapse: Secondary | ICD-10-CM | POA: Diagnosis not present

## 2023-05-15 LAB — GLUCOSE, CAPILLARY
Glucose-Capillary: 99 mg/dL (ref 70–99)
Glucose-Capillary: 133 mg/dL — ABNORMAL HIGH (ref 70–99)

## 2023-05-15 MED ORDER — ENSURE ENLIVE PO LIQD
237.0000 mL | Freq: Two times a day (BID) | ORAL | Status: DC
  Administered 2023-05-15: 237 mL via ORAL

## 2023-05-15 MED ORDER — APIXABAN 2.5 MG PO TABS: 2.5000 mg | ORAL_TABLET | Freq: Two times a day (BID) | ORAL | Status: DC

## 2023-05-15 MED ORDER — ADULT MULTIVITAMIN W/MINERALS CH
1.0000 | ORAL_TABLET | Freq: Every day | ORAL | Status: DC
  Administered 2023-05-15: 1 via ORAL
  Filled 2023-05-15: qty 1

## 2023-05-15 NOTE — [place] FL2 (Signed)
[place] MEDICAID FL2 LEVEL OF CARE FORM     IDENTIFICATION  Patient Name: Kendra Charles Birthdate: 27-Jan-1941 Sex: female Admission Date (Current Location): 05/12/2023  [facility] and [place] Number:  Producer, television/film/video and Address:  [facility], [address], [place], [place] [address]      Provider Number: [id number]  Attending Physician Name and Address:  Burnadette Pop, MD  Relative Name and Phone Number:       Current Level of Care: Hospital Recommended Level of Care: Assisted Living Facility Prior Approval Number:    Date Approved/Denied:   PASRR Number:    Discharge Plan: Other (Comment) (ALF)    Current Diagnoses: Patient Active Problem List   Diagnosis Date Noted   Acute encephalopathy 05/13/2023   Syncope 05/13/2023   Prolonged QT interval 05/13/2023   History of seizures 05/13/2023   Paroxysmal atrial fibrillation (HCC) 03/19/2021   Hypokalemia 03/18/2021   Leukocytosis 03/18/2021   Acute metabolic encephalopathy 03/18/2021   Acute hypoxemic respiratory failure due to COVID-19 [facility]) 09/04/2019   Seizure (HCC) 06/19/2019   Chronic hyponatremia 06/18/2019   AKI (acute kidney injury) (HCC) 06/18/2019   Dementia (HCC) 06/18/2019   Essential hypertension 06/18/2019   Hyperlipidemia 06/18/2019    Orientation RESPIRATION BLADDER Height & Weight     Self  Normal Incontinent Weight: 114 lb 10.2 oz (52 kg) Height:  5\' 4"  (162.6 cm)  BEHAVIORAL SYMPTOMS/MOOD NEUROLOGICAL BOWEL NUTRITION STATUS    Convulsions/Seizures Incontinent Diet (regular)  AMBULATORY STATUS COMMUNICATION OF NEEDS Skin   Extensive Assist Verbally Normal                       Personal Care Assistance Level of Assistance  Bathing, Feeding, Dressing Bathing Assistance: Maximum assistance Feeding assistance: Limited assistance Dressing Assistance: Maximum assistance     Functional Limitations Info             SPECIAL CARE FACTORS FREQUENCY                        Contractures Contractures Info: Not present    Additional Factors Info  Code Status, Allergies Code Status Info: Full Allergies Info: NKA             Discharge Medications: STOP taking these medications     amLODipine 5 MG tablet Commonly known as: NORVASC    cephALEXin 500 MG capsule Commonly known as: KEFLEX    lisinopril 40 MG tablet Commonly known as: ZESTRIL    potassium chloride 10 MEQ tablet Commonly known as: KLOR-CON           TAKE these medications     acetaminophen 500 MG tablet Commonly known as: TYLENOL Take 500 mg by mouth every 6 (six) hours as needed for mild pain, fever or moderate pain (fever 99.5-101).    Antacid Anti-Gas Reg Strength 200-200-20 MG/5ML suspension Generic drug: alum & mag hydroxide-simeth Take 20 mLs by mouth 3 (three) times daily as needed for indigestion.    apixaban 2.5 MG Tabs tablet Commonly known as: ELIQUIS Take 1 tablet (2.5 mg total) by mouth 2 (two) times daily. What changed:  medication strength how much to take    diclofenac Sodium 1 % Gel Commonly known as: VOLTAREN Apply 4 g topically 3 (three) times daily. To right knee    donepezil 5 MG tablet Commonly known as: ARICEPT Take 5 mg by mouth at bedtime.    estradiol 0.1  MG/GM vaginal cream Commonly known as: ESTRACE Place 0.5g in vagina twice a week    feeding supplement (PRO-STAT 64) Liqd Take 30 mLs by mouth 2 (two) times daily with a meal.    levETIRAcetam 500 MG tablet Commonly known as: KEPPRA Take 1 tablet (500 mg total) by mouth 2 (two) times daily.    loratadine 10 MG tablet Commonly known as: CLARITIN Take 10 mg by mouth daily.    LORazepam 0.5 MG tablet Commonly known as: ATIVAN Take 0.5 mg by mouth 2 (two) times daily.    lovastatin 40 MG tablet Commonly known as: MEVACOR Take 40 mg by mouth at bedtime.    magnesium hydroxide 400 MG/5ML suspension Commonly known as: MILK OF MAGNESIA Take 30 mLs by  mouth at bedtime as needed for indigestion.    Melatonin-Theanine 10-5.5 MG Tabs Take 1 tablet by mouth at bedtime.    metoprolol tartrate 50 MG tablet Commonly known as: LOPRESSOR Take 50 mg by mouth 2 (two) times daily.    mirabegron ER 50 MG Tb24 tablet Commonly known as: MYRBETRIQ Take 1 tablet (50 mg total) by mouth daily.    multivitamin with minerals tablet Take 1 tablet by mouth daily.    ondansetron 4 MG disintegrating tablet Commonly known as: ZOFRAN-ODT Take 4 mg by mouth every 6 (six) hours as needed for nausea or vomiting.    potassium chloride SA 20 MEQ tablet Commonly known as: KLOR-CON M Take 20 mEq by mouth daily.    sodium chloride 1 g tablet Take 1 g by mouth every other day.    traMADol 50 MG tablet Commonly known as: ULTRAM Take 50 mg by mouth at bedtime. What changed: Another medication with the same name was removed. Continue taking this medication, and follow the directions you see here.    traZODone 50 MG tablet Commonly known as: DESYREL Take 50 mg by mouth at bedtime.    Relevant Imaging Results:  Relevant Lab Results:   Additional Information SS#: [id number]  Baldemar Lenis, LCSW

## 2023-05-15 NOTE — Evaluation (Signed)
Occupational Therapy Evaluation Patient Details Name: Kendra Charles MRN: [id number] DOB: Jul 27, 1941 Today's Date: 05/15/2023   History of Present Illness 82 yo female from ALF at [facility] presenting unresponsive where CPR given for 3 minutes was started after having an episode of syncope at ALF.  Pt noted this admission to have Afib with RVR. 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 Pt currently with functional limitiations due to the deficits listed below (see OT problem list). Pt at baseline cognitive deficits and w/c use at a facility. Pt requires hand over hand to initiate self feeding and cues for sustain attention to task. Pt states "I don t want to eat but with visual cue immediately reaching for items." Pt needs to have someone complete meals with patient.  Pt will benefit from skilled OT to increase their independence and safety with adls and balance to allow discharge skilled inpatient follow up therapy, <3 hours/day. .       Recommendations for follow up therapy are one component of a multi-disciplinary discharge planning process, led by the attending physician.  Recommendations may be updated based on patient status, additional functional criteria and insurance authorization.   Assistance Recommended at Discharge Intermittent Supervision/Assistance  Patient can return home with the following      Functional Status Assessment  Patient has had a recent decline in their functional status and demonstrates the ability to make significant improvements in function in a reasonable and predictable amount of time.  Equipment Recommendations  Wheelchair (measurements OT);Hospital bed;Other (comment) (lift)     Recommendations for Other Services       Precautions / Restrictions Precautions Precautions: Fall Restrictions Weight Bearing Restrictions: No      Mobility Bed Mobility Overal bed mobility: Needs Assistance Bed Mobility: Supine to Sit, Sit to Supine     Supine to sit: Max assist Sit to supine: Mod assist   General bed mobility comments: pt with HOB elevated the full amount to keep upright to keep pt engaged in sitting. once Ot presented prepared coffee to pt she activated core to static sit adn Ot was able to reduce the [facility] some to keep pt sitting. pt needed cues to remain eob as pt asked frequently "can i lay back down now"    Transfers                   General transfer comment: pt declined and was unable to motivate to initiate task. pt bring legs back into the bed x2      Balance Overall balance assessment: Needs assistance Sitting-balance support: Single extremity supported, Feet supported Sitting balance-Leahy Scale: Poor                                     ADL either performed or assessed with clinical judgement   ADL Overall ADL's : Needs assistance/impaired Eating/Feeding: Minimal assistance   Grooming: Minimal assistance   Upper Body Bathing: Maximal assistance   Lower Body Bathing: Maximal assistance   Upper Body Dressing : Maximal assistance   Lower Body Dressing: Maximal assistance Lower Body Dressing Details (indicate cue type and reason): don sock during session with therapist doing 100% of task  General ADL Comments: pt fixated on laying down. pt motivated to engage in task with coffee.     Vision   Vision Assessment?: Vision impaired- to be further tested in functional context Additional Comments: cognitive deficits present and pt appears to use more central vision at bedside.     Perception     Praxis      Pertinent Vitals/Pain Pain Assessment Pain Assessment: No/denies pain     Hand  Dominance Right   Extremity/Trunk Assessment Upper Extremity Assessment Upper Extremity Assessment: Generalized weakness   Lower Extremity Assessment Lower Extremity Assessment: Generalized weakness   Cervical / Trunk Assessment Cervical / Trunk Assessment: Kyphotic   Communication Communication Communication: HOH   Cognition Arousal/Alertness: Awake/alert Behavior During Therapy: WFL for tasks assessed/performed Overall Cognitive Status: History of cognitive impairments - at baseline                                       General Comments  reports dizziness with sitting. Vitals 116/81 sitting 118/104 laying 121/60    Exercises     Shoulder Instructions      Home Living Family/patient expects to be discharged to:: Assisted living                             Home Equipment: Rollator (4 wheels);Wheelchair - manual   Additional Comments: no family present. current d/c per [facility] team is return to facility      Prior Functioning/Environment Prior Level of Function : Needs assist  Cognitive Assist : Mobility (cognitive);ADLs (cognitive) Mobility (Cognitive): Step by step cues ADLs (Cognitive): Step by step cues       Mobility Comments: using w/c per chart ADLs Comments: needs (A) to initiate self feeding and setup. pt needs hand over hand to continue task        OT Problem List: Decreased strength;Decreased activity tolerance;Impaired balance (sitting and/or standing);Decreased safety awareness;Decreased cognition;Decreased knowledge of use of DME or AE;Decreased knowledge of precautions      OT Treatment/Interventions: Self-care/ADL training;Energy conservation;DME and/or AE instruction;Manual therapy;Therapeutic activities;Cognitive remediation/compensation;Patient/family education;Balance training    OT Goals(Current goals can be found in the care plan section) Acute Rehab OT Goals Patient Stated Goal: pt states i want to get some shut  eye OT Goal Formulation: Patient unable to participate in goal setting Time For Goal Achievement: 05/29/23 Potential to Achieve Goals: Good  OT Frequency: Min 1X/week    Co-evaluation              AM-PAC OT "6 Clicks" Daily Activity     Outcome Measure Help from another person eating meals?: A Lot Help from another person taking care of personal grooming?: A Lot Help from another person toileting, which includes using toliet, bedpan, or urinal?: A Lot Help from another person bathing (including washing, rinsing, drying)?: A Lot Help from another person to put on and taking off regular upper body clothing?: A Lot Help from another person to put on and taking off regular lower body clothing?: A Lot 6 Click Score: 12   End of Session Nurse Communication: Mobility status;Precautions  Activity Tolerance: Patient tolerated treatment well Patient left: in bed;with call bell/phone within reach;with bed alarm set  OT Visit Diagnosis: Unsteadiness on feet (R26.81);Muscle weakness (generalized) (M62.81)                Time: 9147-8295  OT Time Calculation (min): 23 min Charges:  OT General Charges $OT Visit: 1 Visit OT Evaluation $OT Eval Moderate Complexity: 1 Mod   Brynn, OTR/L  Acute Rehabilitation Services Office: [phone] .   Mateo Flow 05/15/2023, 11:04 AM

## 2023-05-15 NOTE — Discharge Summary (Addendum)
Physician Discharge Summary  Kendra Charles UYQ:[id number] DOB: 1941/08/27 DOA: 05/12/2023  PCP: Patient, No Pcp Per  Admit date: 05/12/2023 Discharge date: 05/15/2023  Admitted From: Home Disposition:  Home  Discharge Condition:Stable CODE STATUS:FULL Diet recommendation: Dysphagia 3  Brief/Interim Summary: Patient is 82 year old female with history of advanced dementia, hypertension, hyperlipidemia, paroxysmal A-fib on Eliquis, seizure disorder, chronic hyponatremia, chronic leukocytosis who presented with unresponsiveness from SNF.  Patient has history of advanced dementia.  CPR was also given in the SNF for 3 minutes, then it was noted as patient was conscious and CPR was discontinued.  On presentation, she was hemodynamically stable, saturating fine on room air.  Lab work showed potassium 3.4, WC count of 16.4.  UA was not suspicious of UTI.  EKG showed QTc of 519.  Chest x-ray did not show any acute process.  CT of the head did not show any acute findings.  Patient was admitted for the workup of  syncope.  PT/OT consulted. Her mental status is back to baseline.  Syncopal work up Completed.  Medically stable for discharge back to ALF/SNF today  Following problems were addressed during the hospitalization:  Syncopal episode:Period of  brief unconsciousness, unresponsiveness.  No clear etiology.  Given CPR as well.  Troponin negative.  EKG did not show any acute ischemic signs.  Echocardiogram showed EF of 55 to 60%, indeterminate diastolic parameters, no wall motion abnormality, no aortic stenosis.  PT/OT consulted.  CT head did not show any acute findings.  Orthostatic vitals negative   Prolonged QT: Last QTc has improved to 454   Hypokalemia: Supplemented and corrected   History of hypertension: Currently normotensive.  Home beta-blocker resumed.  Home lisinopril, Norvasc on hold and will be stopped   Hyperlipidemia: On Lovastatin   Paroxysmal A-fib: On Eliquis for anticoagulation.   On beta-blocker for rate control.  She remains in A-fib this morning with heart rate well controlled.  Went into A-fib with RVR on 7/30 and was given Cardizem drip.  Stopped this morning  History of seizures: On Keppra   GAD: Outpatient Ativan to be continued    Chronic hyponatremia: Baseline sodium level in the range of 130-132.  Continue to monitor as an outpatient,stable    Dementia: Alert and awake but confused.  Continue delirium precautions, supportive care, frequent reorientation   Leukocytosis: This is chronic problem.  WBC count of 16.5 on admission.  Unclear etiology.  No signs of infectious process.  Chest x-ray did not show any pneumonia.  UA was suspicious for UTI.  CT abdomen/pelvis did not show any acute intra-abdominal or intrapelvic process.Improved   Discharge Diagnoses:  Principal Problem:   Syncope Active Problems:   Chronic hyponatremia   Essential hypertension   Hyperlipidemia   Hypokalemia   Leukocytosis   Paroxysmal atrial fibrillation (HCC)   Acute encephalopathy   Prolonged QT interval   History of seizures    Discharge Instructions  Discharge Instructions     Diet general   Complete by: As directed    Dysphagia 3 diet   Discharge instructions   Complete by: As directed    1)Please take your medications as instructed 2)Follow up with your PCP and cardiologist as an outpatient. 3)Do CBC and BMP tests in a week   Increase activity slowly   Complete by: As directed       Allergies as of 05/15/2023   No Known Allergies      Medication List     STOP taking these medications  amLODipine 5 MG tablet Commonly known as: NORVASC   cephALEXin 500 MG capsule Commonly known as: KEFLEX   lisinopril 40 MG tablet Commonly known as: ZESTRIL   potassium chloride 10 MEQ tablet Commonly known as: KLOR-CON       TAKE these medications    acetaminophen 500 MG tablet Commonly known as: TYLENOL Take 500 mg by mouth every 6 (six) hours as  needed for mild pain, fever or moderate pain (fever 99.5-101).   Antacid Anti-Gas Reg Strength 200-200-20 MG/5ML suspension Generic drug: alum & mag hydroxide-simeth Take 20 mLs by mouth 3 (three) times daily as needed for indigestion.   apixaban 2.5 MG Tabs tablet Commonly known as: ELIQUIS Take 1 tablet (2.5 mg total) by mouth 2 (two) times daily. What changed:  medication strength how much to take   diclofenac Sodium 1 % Gel Commonly known as: VOLTAREN Apply 4 g topically 3 (three) times daily. To right knee   donepezil 5 MG tablet Commonly known as: ARICEPT Take 5 mg by mouth at bedtime.   estradiol 0.1 MG/GM vaginal cream Commonly known as: ESTRACE Place 0.5g in vagina twice a week   feeding supplement (PRO-STAT 64) Liqd Take 30 mLs by mouth 2 (two) times daily with a meal.   levETIRAcetam 500 MG tablet Commonly known as: KEPPRA Take 1 tablet (500 mg total) by mouth 2 (two) times daily.   loratadine 10 MG tablet Commonly known as: CLARITIN Take 10 mg by mouth daily.   LORazepam 0.5 MG tablet Commonly known as: ATIVAN Take 0.5 mg by mouth 2 (two) times daily.   lovastatin 40 MG tablet Commonly known as: MEVACOR Take 40 mg by mouth at bedtime.   magnesium hydroxide 400 MG/5ML suspension Commonly known as: MILK OF MAGNESIA Take 30 mLs by mouth at bedtime as needed for indigestion.   Melatonin-Theanine 10-5.5 MG Tabs Take 1 tablet by mouth at bedtime.   metoprolol tartrate 50 MG tablet Commonly known as: LOPRESSOR Take 50 mg by mouth 2 (two) times daily.   mirabegron ER 50 MG Tb24 tablet Commonly known as: MYRBETRIQ Take 1 tablet (50 mg total) by mouth daily.   multivitamin with minerals tablet Take 1 tablet by mouth daily.   ondansetron 4 MG disintegrating tablet Commonly known as: ZOFRAN-ODT Take 4 mg by mouth every 6 (six) hours as needed for nausea or vomiting.   potassium chloride SA 20 MEQ tablet Commonly known as: KLOR-CON M Take 20 mEq by  mouth daily.   sodium chloride 1 g tablet Take 1 g by mouth every other day.   traMADol 50 MG tablet Commonly known as: ULTRAM Take 50 mg by mouth at bedtime. What changed: Another medication with the same name was removed. Continue taking this medication, and follow the directions you see here.   traZODone 50 MG tablet Commonly known as: DESYREL Take 50 mg by mouth at bedtime.        No Known Allergies  Consultations: None   Procedures/Studies: ECHOCARDIOGRAM COMPLETE  Result Date: 05/13/2023    ECHOCARDIOGRAM REPORT   Patient Name:   Kendra Charles Date of Exam: 05/13/2023 Medical Rec #:  [id number]      Height:       64.0 in Accession #:    [id number]     Weight:       114.6 lb Date of Birth:  01-14-1941      BSA:          1.544 m Patient Age:  81 years       BP:           140/95 mmHg Patient Gender: F              HR:           . bpm. Exam Location:  Inpatient Procedure: 2D Echo, Cardiac Doppler and Color Doppler Indications:    Syncope R55  History:        Patient has prior history of Echocardiogram examinations, most                 recent 06/21/2019. Risk Factors:Hypertension, Dyslipidemia and                 Former Smoker.  Sonographer:    Dondra Prader RVT RCS Referring Phys: Angie Fava  Sonographer Comments: Unable to complete ECHO due to patients lack of cooperation due to dementia; pulling at EEG leads and refusal to reposition to get subcostals. IMPRESSIONS  1. Left ventricular ejection fraction, by estimation, is 55 to 60%. The left ventricle has normal function. The left ventricle has no regional wall motion abnormalities. There is moderate concentric left ventricular hypertrophy. Left ventricular diastolic parameters are indeterminate.  2. Right ventricular systolic function is normal. The right ventricular size is normal. Tricuspid regurgitation signal is inadequate for assessing PA pressure.  3. Left atrial size was mildly dilated.  4. The mitral valve is normal in  structure. No evidence of mitral valve regurgitation. No evidence of mitral stenosis.  5. The aortic valve is tricuspid. There is mild calcification of the aortic valve. Aortic valve regurgitation is mild. No aortic stenosis is present.  6. Aortic dilatation noted. There is moderate dilatation of the ascending aorta, measuring 48 mm.  7. Limited echo due to poor patient cooperation. FINDINGS  Left Ventricle: Left ventricular ejection fraction, by estimation, is 55 to 60%. The left ventricle has normal function. The left ventricle has no regional wall motion abnormalities. The left ventricular internal cavity size was normal in size. There is  moderate concentric left ventricular hypertrophy. Left ventricular diastolic parameters are indeterminate. Right Ventricle: The right ventricular size is normal. No increase in right ventricular wall thickness. Right ventricular systolic function is normal. Tricuspid regurgitation signal is inadequate for assessing PA pressure. Left Atrium: Left atrial size was mildly dilated. Right Atrium: Right atrial size was normal in size. Pericardium: There is no evidence of pericardial effusion. Mitral Valve: The mitral valve is normal in structure. There is mild calcification of the mitral valve leaflet(s). Mild mitral annular calcification. No evidence of mitral valve regurgitation. No evidence of mitral valve stenosis. Tricuspid Valve: The tricuspid valve is normal in structure. Tricuspid valve regurgitation is trivial. Aortic Valve: The aortic valve is tricuspid. There is mild calcification of the aortic valve. Aortic valve regurgitation is mild. No aortic stenosis is present. Aortic valve mean gradient measures 1.0 mmHg. Aortic valve peak gradient measures 2.8 mmHg. Aortic valve area, by VTI measures 2.61 cm. Pulmonic Valve: The pulmonic valve was normal in structure. Pulmonic valve regurgitation is not visualized. Aorta: The aortic root is normal in size and structure and aortic  dilatation noted. There is moderate dilatation of the ascending aorta, measuring 48 mm. Venous: The inferior vena cava was not well visualized. IAS/Shunts: No atrial level shunt detected by color flow Doppler.  LEFT VENTRICLE PLAX 2D LVIDd:         3.20 cm LVIDs:         2.00 cm LV  PW:         1.30 cm LV IVS:        1.10 cm LVOT diam:     1.80 cm LV SV:         30 LV SV Index:   19 LVOT Area:     2.54 cm  LEFT ATRIUM             Index        RIGHT ATRIUM          Index LA diam:        3.90 cm 2.53 cm/m   RA Area:     9.64 cm LA Vol (A2C):   51.0 ml 33.02 ml/m  RA Volume:   23.50 ml 15.22 ml/m LA Vol (A4C):   37.6 ml 24.35 ml/m LA Biplane Vol: 46.9 ml 30.37 ml/m  AORTIC VALVE                    PULMONIC VALVE AV Area (Vmax):    2.08 cm     PV Vmax:       0.81 m/s AV Area (Vmean):   2.07 cm     PV Peak grad:  2.6 mmHg AV Area (VTI):     2.61 cm AV Vmax:           83.00 cm/s AV Vmean:          54.700 cm/s AV VTI:            0.113 m AV Peak Grad:      2.8 mmHg AV Mean Grad:      1.0 mmHg LVOT Vmax:         67.90 cm/s LVOT Vmean:        44.500 cm/s LVOT VTI:          0.116 m LVOT/AV VTI ratio: 1.03 AR Vena Contracta: 0.40 cm  AORTA Ao Root diam: 3.30 cm Ao Asc diam:  4.80 cm MITRAL VALVE MV Area (PHT): 8.16 cm    SHUNTS MV Decel Time: 93 msec     Systemic VTI:  0.12 m MV E velocity: 76.50 cm/s  Systemic Diam: 1.80 cm Dalton McleanMD Electronically signed by Wilfred Lacy Signature Date/Time: 05/13/2023/4:55:53 PM    Final    CT ABDOMEN PELVIS W CONTRAST  Result Date: 05/13/2023 CLINICAL DATA:  Abdominal pain, acute, nonlocalized EXAM: CT ABDOMEN AND PELVIS WITH CONTRAST TECHNIQUE: Multidetector CT imaging of the abdomen and pelvis was performed using the standard protocol following bolus administration of intravenous contrast. RADIATION DOSE REDUCTION: This exam was performed according to the departmental dose-optimization program which includes automated exposure control, adjustment of the mA and/or kV  according to patient size and/or use of iterative reconstruction technique. CONTRAST:  75mL OMNIPAQUE IOHEXOL 350 MG/ML SOLN COMPARISON:  CT abdomen pelvis 04/30/2023 FINDINGS: Lower chest: Cardiomegaly.  No acute abnormality. Hepatobiliary: No focal liver abnormality. No gallstones, gallbladder wall thickening, or pericholecystic fluid. No biliary dilatation. Pancreas: No focal lesion. Normal pancreatic contour. No surrounding inflammatory changes. No main pancreatic ductal dilatation. Spleen: Normal in size without focal abnormality. Adrenals/Urinary Tract: No adrenal nodule bilaterally. Bilateral kidneys enhance symmetrically. Calcifications associated with the kidneys likely vascular. No hydronephrosis. No hydroureter. The urinary bladder is unremarkable. On delayed imaging, there is no urothelial wall thickening and there are no filling defects in the opacified portions of the bilateral collecting systems or ureters. Stomach/Bowel: Stomach is within normal limits. No evidence of bowel wall thickening or dilatation. Diffuse colonic  diverticulosis. Appendix appears normal. Vascular/Lymphatic: No abdominal aorta or iliac aneurysm. Severe atherosclerotic plaque of the aorta and its branches. No abdominal, pelvic, or inguinal lymphadenopathy. Reproductive: Limited evaluation due to streak artifact. No definite mass. Coarse calcification within the uterus likely a fibroid. Other: No intraperitoneal free fluid. No intraperitoneal free gas. No organized fluid collection. Musculoskeletal: No abdominal wall hernia or abnormality. No suspicious lytic or blastic osseous lesions. No diffusely decreased bone density. Stable chronic incomplete burst fracture of the L1 vertebral body with at least 65% vertebral body height loss and 4 mm retropulsion into the central canal. IMPRESSION: 1. Colonic diverticulosis with no acute diverticulitis. 2. Likely uterine fibroid. 3. Diffusely decreased bone density. Stable chronic  incomplete burst fracture of the L1 vertebral body with at least 65% vertebral body height loss and 4 mm retropulsion into the central canal. 4.  Aortic Atherosclerosis (ICD10-I70.0). Electronically Signed   By: Tish Frederickson M.D.   On: 05/13/2023 00:23   CT Head Wo Contrast  Result Date: 05/12/2023 CLINICAL DATA:  Head trauma, minor (Age >= 65y); Neck trauma (Age >= 65y) EXAM: CT HEAD WITHOUT CONTRAST CT CERVICAL SPINE WITHOUT CONTRAST TECHNIQUE: Multidetector CT imaging of the head and cervical spine was performed following the standard protocol without intravenous contrast. Multiplanar CT image reconstructions of the cervical spine were also generated. RADIATION DOSE REDUCTION: This exam was performed according to the departmental dose-optimization program which includes automated exposure control, adjustment of the mA and/or kV according to patient size and/or use of iterative reconstruction technique. COMPARISON:  CT head 09/15/2020, CT head 12/26/2022, MRI head 08/11/2022 FINDINGS: CT HEAD FINDINGS Brain: Cerebral ventricle sizes are concordant with the degree of cerebral volume loss. Patchy and confluent areas of decreased attenuation are noted throughout the deep and periventricular white matter of the cerebral hemispheres bilaterally, compatible with chronic microvascular ischemic disease. Similar-appearing right temporoparietal and occipital encephalomalacia. Similar-appearing left basal ganglia/thalamus infarction. No evidence of large-territorial acute infarction. No parenchymal hemorrhage. No mass lesion. No extra-axial collection. No mass effect or midline shift. No hydrocephalus. Basilar cisterns are patent. Vascular: No hyperdense vessel. Skull: No acute fracture or focal lesion. Sinuses/Orbits: Paranasal sinuses and mastoid air cells are clear. The orbits are unremarkable. Other: None. CT CERVICAL SPINE FINDINGS Alignment: Normal. Skull base and vertebrae: Diffusely decreased bone density.  Multilevel mild degenerative changes spine. Multilevel posterior disc osteophyte complex formation most prominent at the C5-C6 level. No severe osseous neural foraminal or central canal stenosis. No acute fracture. No aggressive appearing focal osseous lesion or focal pathologic process. Soft tissues and spinal canal: No prevertebral fluid or swelling. No visible canal hematoma. Upper chest: Emphysematous changes. Other: Atherosclerotic plaque of the aortic arch and its main branches. IMPRESSION: 1. No acute intracranial abnormality in a patient with chronic similar-appearing cerebral infarctions and chronic microvascular ischemic changes. 2. No acute displaced fracture or traumatic listhesis of the cervical spine. 3. Diffusely decreased bone density. 4. Aortic Atherosclerosis (ICD10-I70.0) and Emphysema (ICD10-J43.9). Electronically Signed   By: Tish Frederickson M.D.   On: 05/12/2023 22:01   CT Cervical Spine Wo Contrast  Result Date: 05/12/2023 CLINICAL DATA:  Head trauma, minor (Age >= 65y); Neck trauma (Age >= 65y) EXAM: CT HEAD WITHOUT CONTRAST CT CERVICAL SPINE WITHOUT CONTRAST TECHNIQUE: Multidetector CT imaging of the head and cervical spine was performed following the standard protocol without intravenous contrast. Multiplanar CT image reconstructions of the cervical spine were also generated. RADIATION DOSE REDUCTION: This exam was performed according to the departmental dose-optimization program  which includes automated exposure control, adjustment of the mA and/or kV according to patient size and/or use of iterative reconstruction technique. COMPARISON:  CT head 09/15/2020, CT head 12/26/2022, MRI head 08/11/2022 FINDINGS: CT HEAD FINDINGS Brain: Cerebral ventricle sizes are concordant with the degree of cerebral volume loss. Patchy and confluent areas of decreased attenuation are noted throughout the deep and periventricular white matter of the cerebral hemispheres bilaterally, compatible with  chronic microvascular ischemic disease. Similar-appearing right temporoparietal and occipital encephalomalacia. Similar-appearing left basal ganglia/thalamus infarction. No evidence of large-territorial acute infarction. No parenchymal hemorrhage. No mass lesion. No extra-axial collection. No mass effect or midline shift. No hydrocephalus. Basilar cisterns are patent. Vascular: No hyperdense vessel. Skull: No acute fracture or focal lesion. Sinuses/Orbits: Paranasal sinuses and mastoid air cells are clear. The orbits are unremarkable. Other: None. CT CERVICAL SPINE FINDINGS Alignment: Normal. Skull base and vertebrae: Diffusely decreased bone density. Multilevel mild degenerative changes spine. Multilevel posterior disc osteophyte complex formation most prominent at the C5-C6 level. No severe osseous neural foraminal or central canal stenosis. No acute fracture. No aggressive appearing focal osseous lesion or focal pathologic process. Soft tissues and spinal canal: No prevertebral fluid or swelling. No visible canal hematoma. Upper chest: Emphysematous changes. Other: Atherosclerotic plaque of the aortic arch and its main branches. IMPRESSION: 1. No acute intracranial abnormality in a patient with chronic similar-appearing cerebral infarctions and chronic microvascular ischemic changes. 2. No acute displaced fracture or traumatic listhesis of the cervical spine. 3. Diffusely decreased bone density. 4. Aortic Atherosclerosis (ICD10-I70.0) and Emphysema (ICD10-J43.9). Electronically Signed   By: Tish Frederickson M.D.   On: 05/12/2023 22:01   DG Pelvis Portable  Result Date: 05/12/2023 CLINICAL DATA:  ?post cpr EXAM: PORTABLE PELVIS 1-2 VIEWS COMPARISON:  CT abdomen pelvis 04/30/2023 FINDINGS: Limited evaluation due to overlapping osseous structures and overlying soft tissues. There is no evidence of pelvic fracture or diastasis. No pelvic bone lesions are seen. Degenerative changes lower visualized lumbar spine.  Vascular calcifications. IMPRESSION: 1. Negative for acute traumatic injury. Limited evaluation due to overlapping osseous structures and overlying soft tissues. 2.  Aortic Atherosclerosis (ICD10-I70.0). Electronically Signed   By: Tish Frederickson M.D.   On: 05/12/2023 20:29   DG Chest Port 1 View  Result Date: 05/12/2023 CLINICAL DATA:  ?post cpr EXAM: PORTABLE CHEST 1 VIEW.  Patient is rotated. COMPARISON:  CT C-spine 03/22/2022, chest x-ray 12/18/2022 FINDINGS: The heart and mediastinal contours are unchanged. Aortic calcification. Biapical pleural/pulmonary scarring. Biapical cystic changes and emphysematous changes. No focal consolidation. Chronic coarsened markings with no overt pulmonary edema. No pleural effusion. No pneumothorax. No acute osseous abnormality.  Old healed left clavicular fracture. IMPRESSION: 1. No active disease. 2. Aortic Atherosclerosis (ICD10-I70.0) and Emphysema (ICD10-J43.9). Electronically Signed   By: Tish Frederickson M.D.   On: 05/12/2023 20:28   CT ABDOMEN PELVIS W CONTRAST  Result Date: 04/30/2023 CLINICAL DATA:  Abdominal pain.  Blood in stool, possible GI bleed. EXAM: CT ABDOMEN AND PELVIS WITH CONTRAST TECHNIQUE: Multidetector CT imaging of the abdomen and pelvis was performed using the standard protocol following bolus administration of intravenous contrast. RADIATION DOSE REDUCTION: This exam was performed according to the departmental dose-optimization program which includes automated exposure control, adjustment of the mA and/or kV according to patient size and/or use of iterative reconstruction technique. CONTRAST:  75mL OMNIPAQUE IOHEXOL 350 MG/ML SOLN COMPARISON:  09/04/2019. FINDINGS: Lower chest: The heart is enlarged and three-vessel coronary artery calcifications are noted. There is atherosclerotic calcification of the aorta with  aneurysmal dilatation of the ascending aorta measuring 4.9 cm. Mild airspace disease is noted in the left lower lobe. Hepatobiliary:  No focal abnormality in the liver. No biliary ductal dilatation. The gallbladder is without stones. Pancreas: Unremarkable. No pancreatic ductal dilatation or surrounding inflammatory changes. Spleen: Normal in size without focal abnormality. Adrenals/Urinary Tract: The adrenal glands are within normal limits. The kidneys enhance symmetrically. Renal cortical scarring is noted on the left. Calcifications are noted in the kidneys bilaterally, possible vascular calcifications versus renal calculi. No hydronephrosis. Mild bladder wall thickening is noted. Stomach/Bowel: Stomach is within normal limits. Appendix appears normal. No bowel obstruction, free air or pneumatosis. Scattered diverticula are present along the sigmoid colon without evidence of diverticulitis. There is diffuse colonic wall thickening extending into the rectum. Vascular/Lymphatic: Aortic atherosclerosis. No enlarged abdominal or pelvic lymph nodes. Reproductive: Coarse calcifications are noted in the uterus likely representing degenerating fibroid. No adnexal mass. Other: There is pelvic floor laxity with likely rectal and vaginal prolapse. No abdominopelvic ascites. Musculoskeletal: There is a compression deformity at L1 with loss of vertebral body height approximately 60% with a slightly retropulsed element a stable compression deformity is noted in the superior endplate at T11. Degenerative changes are noted in the thoracolumbar spine. IMPRESSION: 1. Diffuse colonic wall thickening, compatible with colitis. 2. Compression deformity at L1 with loss of vertebral body height of 60% with retropulsed element, indeterminate in age. 3. Patchy airspace disease in the left lower lobe, possible atelectasis or infiltrate. 4. Mild diffuse bladder wall thickening, possible infectious or inflammatory cystitis. 5. Aortic atherosclerosis with aneurysmal dilatation of the ascending aorta measuring 4.9 cm. Ascending thoracic aortic aneurysm. Recommend semi-annual  imaging followup by CTA or MRA and referral to cardiothoracic surgery if not already obtained. This recommendation follows 2010 ACCF/AHA/AATS/ACR/ASA/SCA/SCAI/SIR/STS/SVM Guidelines for the Diagnosis and Management of Patients With Thoracic Aortic Disease. Circulation. 2010; 121: B147-W295. Aortic aneurysm NOS (ICD10-I71.9) 6. Cardiomegaly with coronary artery calcifications. 7. Remaining incidental findings as described above. Electronically Signed   By: Thornell Sartorius M.D.   On: 04/30/2023 20:56      Subjective: Patient seen and examined at bedside today.  Hemodynamically stable comfortable.  Lying in bed.  Alert and awake but confused.  Heart rate well-controlled with A-fib rhythm.  I called her son and daughter multiple times to discuss about our management plan and discharge planning, calls not received  Discharge Exam: Vitals:   05/15/23 0742 05/15/23 1037  BP:  (!) 112/91  Pulse: 80 86  Resp: 20   Temp: 98.3 F (36.8 C)   SpO2: 95%    Vitals:   05/15/23 0255 05/15/23 0320 05/15/23 0742 05/15/23 1037  BP: 93/67 98/70  (!) 112/91  Pulse:  80 80 86  Resp: 20 18 20    Temp:  98.3 F (36.8 C) 98.3 F (36.8 C)   TempSrc:  Oral Oral   SpO2:  92% 95%   Weight:      Height:        General: Pt is alert, awake, not in acute distress Cardiovascular:irregularly irregular rhythm,, no rubs, no gallops Respiratory: CTA bilaterally, no wheezing, no rhonchi Abdominal: Soft, NT, ND, bowel sounds + Extremities: no edema, no cyanosis    The results of significant diagnostics from this hospitalization (including imaging, microbiology, ancillary and laboratory) are listed below for reference.     Microbiology: No results found for this or any previous visit (from the past 240 hour(s)).   Labs: BNP (last 3 results) Recent Labs  05/12/23 2026  BNP 201.6*   Basic Metabolic Panel: Recent Labs  Lab 05/12/23 2026 05/12/23 2032 05/12/23 2226 05/13/23 0505 05/14/23 0838  05/15/23 0215  NA 134* 135  --  134* 132* 129*  K 3.4* 3.3*  --  3.7 3.4* 3.7  CL 101  --   --  99 95* 95*  CO2 20*  --   --  25 25 23   GLUCOSE 124*  --   --  106* 99 102*  BUN 6*  --   --  5* 10 10  CREATININE 0.73  --   --  0.66 0.74 0.69  CALCIUM 8.8*  --   --  9.1 9.2 8.8*  MG  --   --  1.7  --   --   --    Liver Function Tests: Recent Labs  Lab 05/12/23 2026 05/13/23 0505  AST 20 22  ALT 13 13  ALKPHOS 121 139*  BILITOT 0.7 0.6  PROT 6.1* 6.9  ALBUMIN 3.2* 3.3*   No results for input(s): "LIPASE", "AMYLASE" in the last 168 hours. Recent Labs  Lab 05/12/23 2026  AMMONIA 13   CBC: Recent Labs  Lab 05/12/23 2026 05/12/23 2032 05/13/23 0505 05/14/23 0838  WBC 16.4*  --  16.1* 14.9*  NEUTROABS 13.0*  --  13.4*  --   HGB 12.7 13.3 14.3 14.0  HCT 40.7 39.0 43.4 41.6  MCV 95.5  --  90.4 90.4  PLT 343  --  389 395   Cardiac Enzymes: No results for input(s): "CKTOTAL", "CKMB", "CKMBINDEX", "TROPONINI" in the last 168 hours. BNP: Invalid input(s): "POCBNP" CBG: Recent Labs  Lab 05/14/23 1232 05/14/23 1708 05/14/23 2106 05/14/23 2323 05/15/23 0323  GLUCAP 111* 185* 126* 149* 99   D-Dimer No results for input(s): "DDIMER" in the last 72 hours. Hgb A1c No results for input(s): "HGBA1C" in the last 72 hours. Lipid Profile No results for input(s): "CHOL", "HDL", "LDLCALC", "TRIG", "CHOLHDL", "LDLDIRECT" in the last 72 hours. Thyroid function studies Recent Labs    05/12/23 2226  TSH 7.509*   Anemia work up No results for input(s): "VITAMINB12", "FOLATE", "FERRITIN", "TIBC", "IRON", "RETICCTPCT" in the last 72 hours. Urinalysis    Component Value Date/Time   COLORURINE STRAW (A) 05/12/2023 2217   APPEARANCEUR CLEAR 05/12/2023 2217   LABSPEC 1.005 05/12/2023 2217   PHURINE 6.0 05/12/2023 2217   GLUCOSEU NEGATIVE 05/12/2023 2217   HGBUR NEGATIVE 05/12/2023 2217   BILIRUBINUR NEGATIVE 05/12/2023 2217   BILIRUBINUR negative 12/11/2022 1537    KETONESUR NEGATIVE 05/12/2023 2217   PROTEINUR NEGATIVE 05/12/2023 2217   UROBILINOGEN 0.2 12/11/2022 1537   NITRITE NEGATIVE 05/12/2023 2217   LEUKOCYTESUR NEGATIVE 05/12/2023 2217   Sepsis Labs Recent Labs  Lab 05/12/23 2026 05/13/23 0505 05/14/23 0838  WBC 16.4* 16.1* 14.9*   Microbiology No results found for this or any previous visit (from the past 240 hour(s)).  Please note: You were cared for by a hospitalist during your hospital stay. Once you are discharged, your primary care physician will handle any further medical issues. Please note that NO REFILLS for any discharge medications will be authorized once you are discharged, as it is imperative that you return to your primary care physician (or establish a relationship with a primary care physician if you do not have one) for your post hospital discharge needs so that they can reassess your need for medications and monitor your lab values.    Time coordinating discharge: 40 minutes  SIGNED:  Burnadette Pop, MD  [facility] 05/15/2023, 10:56 AM Pager [phone]  If 7PM-7AM, please contact night-coverage [email or web address] Password [email or web address]

## 2023-05-15 NOTE — TOC Transition Note (Signed)
Transition of Care [facility]) - CM/SW Discharge Note   Patient Details  Name: Kendra Charles MRN: [id number] Date of Birth: [birth year]  [facility] CM/SW Contact:  Baldemar Lenis, LCSW Phone Number: 05/15/2023, 1:14 PM   Clinical Narrative:   CSW contacted [facility] this morning to discuss patient's care needs, and [facility] expressed that they would be able to provide level of care. CSW updated MD, sent discharge information to [facility]. Multiple calls needed to confirm with [facility] that information was received. CSW spoke with son, Cleone Slim, who was appreciative that patient could return to ALF. Transport arranged with [facility] for after 4, per son request to get patient's room and clothing ready for her return.  Nurse to call report to [phone].    Final next level of care: Assisted Living Barriers to Discharge: Barriers Resolved   Patient Goals and CMS Choice CMS Medicare.gov Compare Post Acute Care list provided to:: Patient Represenative (must comment) Choice offered to / list presented to : Adult Children  Discharge Placement                Patient chooses bed at: [facility] Patient to be transferred to facility by: [facility] Name of family member notified: Rob Patient and family notified of of transfer: 05/15/23  Discharge Plan and Services Additional resources added to the After Visit Summary for       Post Acute Care Choice: Nursing Home                               Social Determinants of Health (SDOH) Interventions SDOH Screenings   Depression (PHQ2-9): Medium Risk (02/14/2022)  Tobacco Use: Medium Risk (05/13/2023)     Readmission Risk Interventions     No data to display

## 2024-01-14 DEATH — deceased
